# Patient Record
Sex: Female | Born: 1960
Health system: Southern US, Community
[De-identification: ages and names within clinical notes are randomized; demographics above are authoritative.]

## PROBLEM LIST (undated history)

## (undated) DIAGNOSIS — D649 Anemia, unspecified: Secondary | ICD-10-CM

## (undated) DIAGNOSIS — M84851 Other disorders of continuity of bone, right pelvic region and thigh: Secondary | ICD-10-CM

## (undated) DIAGNOSIS — M25369 Other instability, unspecified knee: Secondary | ICD-10-CM

## (undated) DIAGNOSIS — M797 Fibromyalgia: Secondary | ICD-10-CM

## (undated) DIAGNOSIS — I10 Essential (primary) hypertension: Secondary | ICD-10-CM

## (undated) DIAGNOSIS — I639 Cerebral infarction, unspecified: Secondary | ICD-10-CM

## (undated) DIAGNOSIS — Z8719 Personal history of other diseases of the digestive system: Secondary | ICD-10-CM

## (undated) DIAGNOSIS — M539 Dorsopathy, unspecified: Secondary | ICD-10-CM

## (undated) DIAGNOSIS — L03116 Cellulitis of left lower limb: Secondary | ICD-10-CM

## (undated) DIAGNOSIS — T8859XA Other complications of anesthesia, initial encounter: Secondary | ICD-10-CM

## (undated) DIAGNOSIS — D509 Iron deficiency anemia, unspecified: Secondary | ICD-10-CM

## (undated) DIAGNOSIS — K219 Gastro-esophageal reflux disease without esophagitis: Secondary | ICD-10-CM

## (undated) DIAGNOSIS — R011 Cardiac murmur, unspecified: Secondary | ICD-10-CM

## (undated) HISTORY — PX: CHOLECYSTECTOMY: SHX55

## (undated) HISTORY — PX: SIGMOID RESECTION / RECTOPEXY: SUR1294

## (undated) HISTORY — PX: OTHER SURGICAL HISTORY: SHX169

## (undated) HISTORY — PX: LEG SURGERY: SHX1003

## (undated) HISTORY — DX: Cellulitis of left lower limb: L03.116

---

## 2013-01-01 ENCOUNTER — Ambulatory Visit (HOSPITAL_BASED_OUTPATIENT_CLINIC_OR_DEPARTMENT_OTHER)
Admit: 2013-01-01 | Discharge: 2013-01-01 | Disposition: A | Discharge status: Home / Self Care | Payer: BC Managed Care – PPO | Attending: Family Medicine | Admitting: Family Medicine

## 2013-01-01 ENCOUNTER — Telehealth: Payer: Self-pay | Admitting: Emergency Medicine

## 2013-01-01 ENCOUNTER — Encounter: Payer: Self-pay | Admitting: Emergency Medicine

## 2013-01-01 ENCOUNTER — Emergency Department
Admission: EM | Admit: 2013-01-01 | Discharge: 2013-01-01 | Disposition: A | Discharge status: Home / Self Care | Payer: BC Managed Care – PPO | Source: Home / Self Care | Attending: Family Medicine | Admitting: Family Medicine

## 2013-01-01 DIAGNOSIS — M79609 Pain in unspecified limb: Secondary | ICD-10-CM | POA: Insufficient documentation

## 2013-01-01 DIAGNOSIS — M7989 Other specified soft tissue disorders: Secondary | ICD-10-CM | POA: Insufficient documentation

## 2013-01-01 DIAGNOSIS — M25579 Pain in unspecified ankle and joints of unspecified foot: Secondary | ICD-10-CM | POA: Insufficient documentation

## 2013-01-01 DIAGNOSIS — L0390 Cellulitis, unspecified: Secondary | ICD-10-CM

## 2013-01-01 DIAGNOSIS — L539 Erythematous condition, unspecified: Secondary | ICD-10-CM | POA: Insufficient documentation

## 2013-01-01 DIAGNOSIS — L03119 Cellulitis of unspecified part of limb: Secondary | ICD-10-CM

## 2013-01-01 DIAGNOSIS — L02419 Cutaneous abscess of limb, unspecified: Secondary | ICD-10-CM

## 2013-01-01 HISTORY — DX: Fibromyalgia: M79.7

## 2013-01-01 HISTORY — DX: Dorsopathy, unspecified: M53.9

## 2013-01-01 HISTORY — DX: Other disorders of continuity of bone, right pelvic region and thigh: M84.851

## 2013-01-01 HISTORY — DX: Cerebral infarction, unspecified: I63.9

## 2013-01-01 LAB — CBC
HCT: 38.2 % (ref 36.0–46.0)
MCV: 93.4 fL (ref 78.0–100.0)
RBC: 4.09 MIL/uL (ref 3.87–5.11)
WBC: 8.8 10*3/uL (ref 4.0–10.5)

## 2013-01-01 MED ORDER — CEFTRIAXONE SODIUM 1 G IJ SOLR: 1.0000 g | Freq: Once | INTRAMUSCULAR | Status: DC

## 2013-01-01 MED ORDER — HYDROCODONE-ACETAMINOPHEN 5-325 MG PO TABS: ORAL_TABLET | ORAL | Status: DC

## 2013-01-01 MED ORDER — SULFAMETHOXAZOLE-TRIMETHOPRIM 800-160 MG PO TABS: 1.0000 | ORAL_TABLET | Freq: Two times a day (BID) | ORAL | Status: DC

## 2013-01-01 MED ORDER — CEPHALEXIN 500 MG PO CAPS: 500.0000 mg | ORAL_CAPSULE | Freq: Four times a day (QID) | ORAL | Status: DC
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
## 2013-01-01 NOTE — Discharge Instructions (Signed)
Elevate leg as much as possible.  Apply heating pad several times daily.   Cellulitis Cellulitis is an infection of the skin and the tissue beneath it. The infected area is usually red and tender. Cellulitis occurs most often in the arms and lower legs.  CAUSES  Cellulitis is caused by bacteria that enter the skin through cracks or cuts in the skin. The most common types of bacteria that cause cellulitis are Staphylococcus and Streptococcus. SYMPTOMS   Redness and warmth.  Swelling.  Tenderness or pain.  Fever. DIAGNOSIS  Your caregiver can usually determine what is wrong based on a physical exam. Blood tests may also be done. TREATMENT  Treatment usually involves taking an antibiotic medicine. HOME CARE INSTRUCTIONS   Take your antibiotics as directed. Finish them even if you start to feel better.  Keep the infected arm or leg elevated to reduce swelling.  Apply a warm cloth to the affected area up to 4 times per day to relieve pain.  Only take over-the-counter or prescription medicines for pain, discomfort, or fever as directed by your caregiver.  Keep all follow-up appointments as directed by your caregiver. SEEK MEDICAL CARE IF:   You notice red streaks coming from the infected area.  Your red area gets larger or turns dark in color.  Your bone or joint underneath the infected area becomes painful after the skin has healed.  Your infection returns in the same area or another area.  You notice a swollen bump in the infected area.  You develop new symptoms. SEEK IMMEDIATE MEDICAL CARE IF:   You have a fever.  You feel very sleepy.  You develop vomiting or diarrhea.  You have a general ill feeling (malaise) with muscle aches and pains. MAKE SURE YOU:   Understand these instructions.  Will watch your condition.  Will get help right away if you are not doing well or get worse. Document Released: 05/01/2005 Document Revised: 01/21/2012 Document Reviewed:  10/07/2011 [facility] Patient Information 2014 [place], [place].

## 2013-01-01 NOTE — ED Notes (Signed)
Patient reports progressive worsening of swelling and redness in left lower leg x 7 days. Has just come from [facility] where she had oral surgery and has RX for Penicillin ordered post-op. Reports did have edema in extemeties on past occasions, but not this severe.

## 2013-01-03 ENCOUNTER — Telehealth: Payer: Self-pay | Admitting: *Deleted

## 2013-01-03 MED ORDER — HYDROCODONE-ACETAMINOPHEN 5-325 MG PO TABS: ORAL_TABLET | ORAL | Status: DC

## 2013-07-23 ENCOUNTER — Emergency Department
Admission: EM | Admit: 2013-07-23 | Discharge: 2013-07-23 | Disposition: A | Discharge status: Home / Self Care | Payer: BC Managed Care – PPO | Source: Home / Self Care | Attending: Emergency Medicine | Admitting: Emergency Medicine

## 2013-07-23 ENCOUNTER — Emergency Department: Payer: BC Managed Care – PPO

## 2013-07-23 ENCOUNTER — Encounter: Payer: Self-pay | Admitting: Emergency Medicine

## 2013-07-23 DIAGNOSIS — L03116 Cellulitis of left lower limb: Secondary | ICD-10-CM

## 2013-07-23 DIAGNOSIS — I890 Lymphedema, not elsewhere classified: Secondary | ICD-10-CM

## 2013-07-23 DIAGNOSIS — L02419 Cutaneous abscess of limb, unspecified: Secondary | ICD-10-CM

## 2013-07-23 MED ORDER — CEPHALEXIN 500 MG PO CAPS: 500.0000 mg | ORAL_CAPSULE | Freq: Four times a day (QID) | ORAL | Status: DC

## 2013-07-23 MED ORDER — HYDROCODONE-ACETAMINOPHEN 5-325 MG PO TABS: 1.0000 | ORAL_TABLET | Freq: Four times a day (QID) | ORAL | Status: DC | PRN

## 2013-07-23 MED ORDER — DOXYCYCLINE HYCLATE 100 MG PO CAPS: 100.0000 mg | ORAL_CAPSULE | Freq: Two times a day (BID) | ORAL | Status: DC

## 2013-07-23 MED ORDER — CEFTRIAXONE SODIUM 1 G IJ SOLR
1.0000 g | INTRAMUSCULAR | Status: AC
  Administered 2013-07-23: 1 g via INTRAMUSCULAR
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
## 2013-07-23 NOTE — ED Notes (Signed)
Pt c/o LLE swelling, redness and pain x 1wk. She reports a hx of lymphedema and cellulitis. Denies fever.

## 2013-09-01 ENCOUNTER — Encounter: Payer: Self-pay | Admitting: Emergency Medicine

## 2013-09-01 ENCOUNTER — Emergency Department (INDEPENDENT_AMBULATORY_CARE_PROVIDER_SITE_OTHER): Payer: BC Managed Care – PPO

## 2013-09-01 ENCOUNTER — Emergency Department
Admission: EM | Admit: 2013-09-01 | Discharge: 2013-09-01 | Disposition: A | Discharge status: Home / Self Care | Payer: BC Managed Care – PPO | Source: Home / Self Care | Attending: Family Medicine | Admitting: Family Medicine

## 2013-09-01 DIAGNOSIS — X58XXXA Exposure to other specified factors, initial encounter: Secondary | ICD-10-CM

## 2013-09-01 DIAGNOSIS — S2239XA Fracture of one rib, unspecified side, initial encounter for closed fracture: Secondary | ICD-10-CM

## 2013-09-01 DIAGNOSIS — J9819 Other pulmonary collapse: Secondary | ICD-10-CM

## 2013-09-01 DIAGNOSIS — J90 Pleural effusion, not elsewhere classified: Secondary | ICD-10-CM

## 2013-09-01 DIAGNOSIS — S2232XA Fracture of one rib, left side, initial encounter for closed fracture: Secondary | ICD-10-CM

## 2013-09-01 MED ORDER — TAPENTADOL HCL 100 MG PO TABS: 1.0000 | ORAL_TABLET | Freq: Four times a day (QID) | ORAL | Status: DC | PRN
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
## 2013-09-01 NOTE — Discharge Instructions (Signed)
Recommend 400 to 600 units of vitamin D, and 1000mg  calcium per day.   Rib Fracture A rib fracture is a break or crack in one of the bones of the ribs. The ribs are a group of long, curved bones that wrap around your chest and attach to your spine. They protect your lungs and other organs in the chest cavity. A broken or cracked rib is often painful, but most do not cause other problems. Most rib fractures heal on their own over time. However, rib fractures can be more serious if multiple ribs are broken or if broken ribs move out of place and push against other structures. CAUSES   A direct blow to the chest. For example, this could happen during contact sports, a car accident, or a fall against a hard object.  Repetitive movements with high force, such as pitching a baseball or having severe coughing spells. SYMPTOMS   Pain when you breathe in or cough.  Pain when someone presses on the injured area. DIAGNOSIS  Your caregiver will perform a physical exam. Various imaging tests may be ordered to confirm the diagnosis and to look for related injuries. These tests may include a chest X-ray, computed tomography (CT), magnetic resonance imaging (MRI), or a bone scan. TREATMENT  Rib fractures usually heal on their own in 1 3 months. The longer healing period is often associated with a continued cough or other aggravating activities. During the healing period, pain control is very important. Medication is usually given to control pain. Hospitalization or surgery may be needed for more severe injuries, such as those in which multiple ribs are broken or the ribs have moved out of place.  HOME CARE INSTRUCTIONS   Avoid strenuous activity and any activities or movements that cause pain. Be careful during activities and avoid bumping the injured rib.  Gradually increase activity as directed by your caregiver.  Only take over-the-counter or prescription medications as directed by your caregiver. Do not  take other medications without asking your caregiver first.  Apply ice to the injured area for the first 1 2 days after you have been treated or as directed by your caregiver. Applying ice helps to reduce inflammation and pain.  Put ice in a plastic bag.  Place a towel between your skin and the bag.   Leave the ice on for 15 20 minutes at a time, every 2 hours while you are awake.  Perform deep breathing as directed by your caregiver. This will help prevent pneumonia, which is a common complication of a broken rib. Your caregiver may instruct you to:  Take deep breaths several times a day.  Try to cough several times a day, holding a pillow against the injured area.  Use a device called an incentive spirometer to practice deep breathing several times a day.  Drink enough fluids to keep your urine clear or pale yellow. This will help you avoid constipation.   Do not wear a rib belt or binder. These restrict breathing, which can lead to pneumonia.  SEEK IMMEDIATE MEDICAL CARE IF:   You have a fever.   You have difficulty breathing or shortness of breath.   You develop a continual cough, or you cough up thick or bloody sputum.  You feel sick to your stomach (nausea), throw up (vomit), or have abdominal pain.   You have worsening pain not controlled with medications.  MAKE SURE YOU:  Understand these instructions.  Will watch your condition.  Will get help right  away if you are not doing well or get worse. Document Released: 07/22/2005 Document Revised: 03/24/2013 Document Reviewed: 09/23/2012 [facility] Patient Information 2014 [place], [place].

## 2013-09-01 NOTE — ED Notes (Signed)
Sophia Smith fell 9 days ago, went to [facility] in [place], Dx with left rib fx and puncture lung. She was discharged 2 days later and told to f/u with MD 1 week later for f/u x-ray. Pain with deep breathing. Reports breathing and pain are improving. Mild bruising still present on left side of back. She was a smoker, no smoking since her fall 9 days ago.

## 2013-09-04 ENCOUNTER — Telehealth: Payer: Self-pay

## 2013-09-04 NOTE — ED Notes (Signed)
I called and spoke with patient and she is doing better. I advised to call back if anything changes or if she has questions or concerns.  

## 2013-09-13 ENCOUNTER — Ambulatory Visit (INDEPENDENT_AMBULATORY_CARE_PROVIDER_SITE_OTHER): Payer: BC Managed Care – PPO

## 2013-09-13 ENCOUNTER — Ambulatory Visit (INDEPENDENT_AMBULATORY_CARE_PROVIDER_SITE_OTHER): Payer: BC Managed Care – PPO | Admitting: Physician Assistant

## 2013-09-13 ENCOUNTER — Encounter: Payer: Self-pay | Admitting: Physician Assistant

## 2013-09-13 VITALS — BP 110/73 | HR 111 | Ht 69.0 in | Wt 210.0 lb

## 2013-09-13 DIAGNOSIS — Z13220 Encounter for screening for lipoid disorders: Secondary | ICD-10-CM

## 2013-09-13 DIAGNOSIS — M519 Unspecified thoracic, thoracolumbar and lumbosacral intervertebral disc disorder: Secondary | ICD-10-CM | POA: Insufficient documentation

## 2013-09-13 DIAGNOSIS — M797 Fibromyalgia: Secondary | ICD-10-CM

## 2013-09-13 DIAGNOSIS — X58XXXA Exposure to other specified factors, initial encounter: Secondary | ICD-10-CM

## 2013-09-13 DIAGNOSIS — S2232XA Fracture of one rib, left side, initial encounter for closed fracture: Secondary | ICD-10-CM

## 2013-09-13 DIAGNOSIS — J9383 Other pneumothorax: Secondary | ICD-10-CM

## 2013-09-13 DIAGNOSIS — S2239XA Fracture of one rib, unspecified side, initial encounter for closed fracture: Secondary | ICD-10-CM

## 2013-09-13 DIAGNOSIS — I720 Aneurysm of carotid artery: Secondary | ICD-10-CM

## 2013-09-13 DIAGNOSIS — F32A Depression, unspecified: Secondary | ICD-10-CM

## 2013-09-13 DIAGNOSIS — Z131 Encounter for screening for diabetes mellitus: Secondary | ICD-10-CM

## 2013-09-13 DIAGNOSIS — J939 Pneumothorax, unspecified: Secondary | ICD-10-CM

## 2013-09-13 DIAGNOSIS — F329 Major depressive disorder, single episode, unspecified: Secondary | ICD-10-CM

## 2013-09-13 DIAGNOSIS — I890 Lymphedema, not elsewhere classified: Secondary | ICD-10-CM

## 2013-09-13 DIAGNOSIS — S2249XA Multiple fractures of ribs, unspecified side, initial encounter for closed fracture: Secondary | ICD-10-CM

## 2013-09-13 DIAGNOSIS — S329XXA Fracture of unspecified parts of lumbosacral spine and pelvis, initial encounter for closed fracture: Secondary | ICD-10-CM
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
## 2013-09-15 DIAGNOSIS — I720 Aneurysm of carotid artery: Secondary | ICD-10-CM | POA: Insufficient documentation

## 2013-09-15 DIAGNOSIS — F329 Major depressive disorder, single episode, unspecified: Secondary | ICD-10-CM | POA: Insufficient documentation

## 2013-09-15 DIAGNOSIS — F32A Depression, unspecified: Secondary | ICD-10-CM | POA: Insufficient documentation

## 2013-09-15 DIAGNOSIS — S329XXA Fracture of unspecified parts of lumbosacral spine and pelvis, initial encounter for closed fracture: Secondary | ICD-10-CM | POA: Insufficient documentation

## 2013-10-15 ENCOUNTER — Encounter: Payer: Self-pay | Admitting: Physician Assistant

## 2013-10-15 DIAGNOSIS — R748 Abnormal levels of other serum enzymes: Secondary | ICD-10-CM | POA: Insufficient documentation

## 2013-10-15 LAB — COMPLETE METABOLIC PANEL WITH GFR
ALBUMIN: 3.5 g/dL (ref 3.5–5.2)
ALT: 8 U/L (ref 0–35)
AST: 12 U/L (ref 0–37)
Alkaline Phosphatase: 119 U/L — ABNORMAL HIGH (ref 39–117)
BUN: 16 mg/dL (ref 6–23)
CALCIUM: 8.7 mg/dL (ref 8.4–10.5)
CHLORIDE: 110 meq/L (ref 96–112)
CO2: 27 meq/L (ref 19–32)
CREATININE: 0.56 mg/dL (ref 0.50–1.10)
GLUCOSE: 81 mg/dL (ref 70–99)
POTASSIUM: 4.1 meq/L (ref 3.5–5.3)
Sodium: 143 mEq/L (ref 135–145)
Total Bilirubin: 0.3 mg/dL (ref 0.2–1.2)
Total Protein: 6.1 g/dL (ref 6.0–8.3)

## 2013-10-15 LAB — LIPID PANEL
CHOLESTEROL: 114 mg/dL (ref 0–200)
HDL: 36 mg/dL — ABNORMAL LOW (ref >39)
LDL Cholesterol: 68 mg/dL (ref 0–99)
TRIGLYCERIDES: 52 mg/dL (ref <150)
Total CHOL/HDL Ratio: 3.2 Ratio
VLDL: 10 mg/dL (ref 0–40)

## 2013-10-15 LAB — VITAMIN D 25 HYDROXY (VIT D DEFICIENCY, FRACTURES): VIT D 25 HYDROXY: 33 ng/mL (ref 30–89)

## 2013-10-25 ENCOUNTER — Encounter: Payer: Self-pay | Admitting: Physician Assistant

## 2013-10-25 ENCOUNTER — Ambulatory Visit (INDEPENDENT_AMBULATORY_CARE_PROVIDER_SITE_OTHER): Payer: BC Managed Care – PPO | Admitting: Physician Assistant

## 2013-10-25 VITALS — BP 143/95 | HR 104 | Wt 224.0 lb

## 2013-10-25 DIAGNOSIS — M25562 Pain in left knee: Secondary | ICD-10-CM

## 2013-10-25 DIAGNOSIS — R748 Abnormal levels of other serum enzymes: Secondary | ICD-10-CM

## 2013-10-25 DIAGNOSIS — R609 Edema, unspecified: Secondary | ICD-10-CM

## 2013-10-25 DIAGNOSIS — M25569 Pain in unspecified knee: Secondary | ICD-10-CM

## 2013-10-25 DIAGNOSIS — R600 Localized edema: Secondary | ICD-10-CM

## 2013-10-25 MED ORDER — FUROSEMIDE 20 MG PO TABS: 20.0000 mg | ORAL_TABLET | Freq: Every day | ORAL | Status: DC

## 2013-10-25 MED ORDER — CELECOXIB 200 MG PO CAPS: 200.0000 mg | ORAL_CAPSULE | Freq: Two times a day (BID) | ORAL | Status: DC

## 2013-10-25 NOTE — Patient Instructions (Addendum)
Meniscus Tear with Phase I Rehab The meniscus is a C-shaped cartilage structure, located in the knee joint between the thigh bone (femur) and the shinbone (tibia). Two menisci are located in each knee joint: the inner and outer meniscus. The meniscus acts as an adapter between the thigh bone and shinbone, allowing them to fit properly together. It also functions as a shock absorber, to reduce the stress placed on the knee joint and to help supply nutrients to the knee joint cartilage. As people age, the meniscus begins to harden and become more vulnerable to injury. Meniscus tears are a common injury, especially in older athletes. Inner meniscus tears are more common than outer meniscus tears.  SYMPTOMS   Pain in the knee, especially with standing or squatting with the affected leg.  Tenderness along the joint line.  Swelling in the knee joint (effusion), usually starting 1 to 2 days after injury.  Locking or catching of the knee joint, causing inability to straighten the knee completely.  Giving way or buckling of the knee. CAUSES  A meniscus tear occurs when a force is placed on the meniscus that is greater than it can handle. Common causes of injury include:  Direct hit (trauma) to the knee.  Twisting, pivoting, or cutting (rapidly changing direction while running), kneeling or squatting.  Without injury, due to aging. RISK INCREASES WITH:  Contact sports (football, rugby).  Sports in which cleats are used with pivoting (soccer, lacrosse) or sports in which good shoe grip and sudden change in direction are required (racquetball, basketball, squash).  Previous knee injury.  Associated knee injury, particularly ligament injuries.  Poor strength and flexibility. PREVENTION  Warm up and stretch properly before activity.  Maintain physical fitness:  Strength, flexibility, and endurance.  Cardiovascular fitness.  Protect the knee with a brace or elastic bandage.  Wear  properly fitted protective equipment (proper cleats for the surface). PROGNOSIS  Sometimes, meniscus tears heal on their own. However, definitive treatment requires surgery, followed by at least 6 weeks of recovery.  RELATED COMPLICATIONS   Recurring symptoms that result in a chronic problem.  Repeated knee injury, especially if sports are resumed too soon after injury or surgery.  Progression of the tear (the tear gets larger), if untreated.  Arthritis of the knee in later years (with or without surgery).  Complications of surgery, including infection, bleeding, injury to nerves (numbness, weakness, paralysis) continued pain, giving way, locking, nonhealing of meniscus (if repaired), need for further surgery, and knee stiffness (loss of motion). TREATMENT  Treatment first involves the use of ice and medicine, to reduce pain and inflammation. You may find using crutches to walk more comfortable. However, it is okay to bear weight on the injured knee, if the pain will allow it. Surgery is often advised as a definitive treatment. Surgery is performed through an incision near the joint (arthroscopically). The torn piece of the meniscus is removed, and if possible the joint cartilage is repaired. After surgery, the joint must be restrained. After restraint, it is important to perform strengthening and stretching exercises to help regain strength and a full range of motion. These exercises may be completed at home or with a therapist.  MEDICATION  If pain medicine is needed, nonsteroidal anti-inflammatory medicines (aspirin and ibuprofen), or other minor pain relievers (acetaminophen), are often advised.  Do not take pain medicine for 7 days before surgery.  Prescription pain relievers may be given, if your caregiver thinks they are needed. Use only as directed and   only as much as you need. HEAT AND COLD  Cold treatment (icing) should be applied for 10 to 15 minutes every 2 to 3 hours for  inflammation and pain, and immediately after activity that aggravates your symptoms. Use ice packs or an ice massage.  Heat treatment may be used before performing stretching and strengthening activities prescribed by your caregiver, physical therapist, or athletic trainer. Use a heat pack or a warm water soak. SEEK MEDICAL CARE IF:   Symptoms get worse or do not improve in 2 weeks, despite treatment.  New, unexplained symptoms develop. (Drugs used in treatment may produce side effects.) EXERCISES RANGE OF MOTION (ROM) AND STRETCHING EXERCISES - Meniscus Tear, Non-operative, Phase I These are some of the initial exercises with which you may start your rehabilitation program, until you see your caregiver again or until your symptoms are resolved. Remember:   These initial exercises are intended to be gentle. They will help you restore motion without increasing any swelling.  Completing these exercises allows less painful movement and prepares you for the more aggressive strengthening exercises in Phase II.  An effective stretch should be held for at least 30 seconds.  A stretch should never be painful. You should only feel a gentle lengthening or release in the stretched tissue. RANGE OF MOTION - Knee Flexion, Active  Lie on your back with both knees straight. (If this causes back discomfort, bend your healthy knee, placing your foot flat on the floor.)  Slowly slide your heel back toward your buttocks until you feel a gentle stretch in the front of your knee or thigh.  Hold for __________ seconds. Slowly slide your heel back to the starting position. Repeat __________ times. Complete this exercise __________ times per day.  RANGE OF MOTION - Knee Flexion and Extension, Active-Assisted  Sit on the edge of a table or chair with your thighs firmly supported. It may be helpful to place a folded towel under the end of your right / left thigh.  Flexion (bending): Place the ankle of your  healthy leg on top of the other ankle. Use your healthy leg to gently bend your right / left knee until you feel a mild tension across the top of your knee.  Hold for __________ seconds.  Extension (straightening): Switch your ankles so your right / left leg is on top. Use your healthy leg to straighten your right / left knee until you feel a mild tension on the backside of your knee.  Hold for __________ seconds. Repeat __________ times. Complete __________ times per day. STRETCH - Knee Flexion, Supine  Lie on the floor with your right / left heel and foot lightly touching the wall. (Place both feet on the wall if you do not use a door frame.)  Without using any effort, allow gravity to slide your foot down the wall slowly until you feel a gentle stretch in the front of your right / left knee.  Hold this stretch for __________ seconds. Then return the leg to the starting position, using your healthy leg for help, if needed. Repeat __________ times. Complete this stretch __________ times per day.  STRETCH - Knee Extension Sitting  Sit with your right / left leg/heel propped on another chair, coffee table, or foot stool.  Allow your leg muscles to relax, letting gravity straighten out your knee.*  You should feel a stretch behind your right / left knee. Hold this position for __________ seconds. Repeat __________ times. Complete this stretch __________   times per day.  *Your physician, physical therapist or athletic trainer may instruct you place a __________ weight on your thigh, just above your kneecap, to deepen the stretch.  STRENGTHENING EXERCISES - Meniscus Tear, Non-operative, Phase I These exercises may help you when beginning to rehabilitate your injury. They may resolve your symptoms with or without further involvement from your physician, physical therapist or athletic trainer. While completing these exercises, remember:   Muscles can gain both the endurance and the strength  needed for everyday activities through controlled exercises.  Complete these exercises as instructed by your physician, physical therapist or athletic trainer. Progress the resistance and repetitions only as guided. STRENGTH - Quadriceps, Isometrics  Lie on your back with your right / left leg extended and your opposite knee bent.  Gradually tense the muscles in the front of your right / left thigh. You should see either your knee cap slide up toward your hip or increased dimpling just above the knee. This motion will push the back of the knee down toward the floor, mat, or bed on which you are lying.  Hold the muscle as tight as you can, without increasing your pain, for __________ seconds.  Relax the muscles slowly and completely between each repetition. Repeat __________ times. Complete this exercise __________ times per day.  STRENGTH - Quadriceps, Short Arcs   Lie on your back. Place a __________ inch towel roll under your right / left knee, so that the knee bends slightly.  Raise only your lower leg by tightening the muscles in the front of your thigh. Do not allow your thigh to rise.  Hold this position for __________ seconds. Repeat __________ times. Complete this exercise __________ times per day.  OPTIONAL ANKLE WEIGHTS: Begin with ____________________, but DO NOT exceed ____________________. Increase in 1 pound/0.5 kilogram increments. STRENGTH - Quadriceps, Straight Leg Raises  Quality counts! Watch for signs that the quadriceps muscle is working, to be sure you are strengthening the correct muscles and not "cheating" by substituting with healthier muscles.  Lay on your back with your right / left leg extended and your opposite knee bent.  Tense the muscles in the front of your right / left thigh. You should see either your knee cap slide up or increased dimpling just above the knee. Your thigh may even shake a bit.  Tighten these muscles even more and raise your leg 4 to 6  inches off the floor. Hold for __________ seconds.  Keeping these muscles tense, lower your leg.  Relax the muscles slowly and completely in between each repetition. Repeat __________ times. Complete this exercise __________ times per day.  STRENGTH - Hamstring, Curls   Lay on your stomach with your legs extended. (If you lay on a bed, your feet may hang over the edge.)  Tighten the muscles in the back of your thigh to bend your right / left knee up to 90 degrees. Keep your hips flat on the bed.  Hold this position for __________ seconds.  Slowly lower your leg back to the starting position. Repeat __________ times. Complete this exercise __________ times per day.  STRENGTH  Quadriceps, Squats  Stand in a door frame so that your feet and knees are in line with the frame.  Use your hands for balance, not support, on the frame.  Slowly lower your weight, bending at the hips and knees. Keep your lower legs upright so that they are parallel with the door frame. Squat only within the range that does   not increase your knee pain. Never let your hips drop below your knees.  Slowly return upright, pushing with your legs, not pulling with your hands. Repeat __________ times. Complete this exercise __________ times per day.  STRENGTH - Quad/VMO, Isometric   Sit in a chair with your right / left knee slightly bent. With your fingertips, feel the VMO muscle just above the inside of your knee. The VMO is important in controlling the position of your kneecap.  Keeping your fingertips on this muscle. Without actually moving your leg, attempt to drive your knee down as if straightening your leg. You should feel your VMO tense. If you have a difficult time, you may wish to try the same exercise on your healthy knee first.  Tense this muscle as hard as you can without increasing any knee pain.  Hold for __________ seconds. Relax the muscles slowly and completely in between each repetition. Repeat  __________ times. Complete exercise __________ times per day.  Document Released: 08/05/1998 Document Revised: 10/14/2011 Document Reviewed: 11/03/2008 [employer] Patient Information 2014 [employer].    

## 2013-10-25 NOTE — Progress Notes (Signed)
   Subjective:    Patient ID: Sophia Smith, female    DOB: [birth year], 53 y.o.   MRN: [id number]  HPI Pt is a 52 yo female who presents to the clinic with left knee pain and to followup on elevated alkaline phosphatase levels..  Left knee pain started about one month ago after a fall in the car port. She she fell backwards and landed on her back however she feels that she twisted her left knee. Pain is located on the lateral side of left knee. Hurts worse with walking and bearing weight as well as changing positions. She walks with a cane. She has not tried anything to make better. She does feel like her legs are swelling more and that seems to be making worse.   Elevated alkaline phosphatase-we'll recheck today.    Review of Systems     Objective:   Physical Exam  Constitutional: She appears well-developed and well-nourished.  Obesity.   HENT:  Head: Normocephalic and atraumatic.  Cardiovascular: Regular rhythm and normal heart sounds.   Tachycardia at 104.   Pulmonary/Chest: Effort normal and breath sounds normal.  Musculoskeletal:  Extension and flexion are full with some discomfort. Flexion to about 110 degrees(limited by swelling of leg and pain). Pain in the lateral joint space. Negative McMurrays. Strength 5/5 bilateral legs. I don't see in significant effusion of knee but subcutaneous fat surrounds.   1+pitting edema in bilateral lower extremities.   Skin: Skin is dry.  Psychiatric: She has a normal mood and affect. Her behavior is normal.          Assessment & Plan:  Left knee pain- would like to get bilateral knee films to compare for osteoarthritis. I do suspect that there is a partial tear of the meniscus. I offered patient injection in the knee today. She declined. Patient is unable to tolerate NSAIDs do to abdominal pain/GI intolerance. Up with to try Celebrex to see if it would be easier on her stomach. I encouraged patient to elevate and ice the knee as much as  possible in the next couple days as well as working on range of motion with exercises given. Followup if pain not improving.  Bilateral lower extremity edema- patient is present with weight gain and bilateral edema today. We'll start Lasix 20 mg daily and followup in one month. Pt has compression stocking and encouraged to wear.   Elevated alkaline phosphatase-this could be do to pain medication that she was recently on for pneumothorax. We'll recheck today.

## 2013-10-26 ENCOUNTER — Ambulatory Visit (INDEPENDENT_AMBULATORY_CARE_PROVIDER_SITE_OTHER): Payer: BC Managed Care – PPO

## 2013-10-26 ENCOUNTER — Encounter: Payer: Self-pay | Admitting: Physician Assistant

## 2013-10-26 DIAGNOSIS — M25562 Pain in left knee: Secondary | ICD-10-CM | POA: Insufficient documentation

## 2013-10-26 DIAGNOSIS — R600 Localized edema: Secondary | ICD-10-CM | POA: Insufficient documentation

## 2013-10-26 DIAGNOSIS — M1710 Unilateral primary osteoarthritis, unspecified knee: Secondary | ICD-10-CM | POA: Insufficient documentation

## 2013-10-26 DIAGNOSIS — IMO0002 Reserved for concepts with insufficient information to code with codable children: Secondary | ICD-10-CM

## 2013-10-27 ENCOUNTER — Telehealth: Payer: Self-pay | Admitting: *Deleted

## 2013-10-27 LAB — COMPLETE METABOLIC PANEL WITH GFR
ALK PHOS: 117 U/L (ref 39–117)
ALT: 8 U/L (ref 0–35)
AST: 12 U/L (ref 0–37)
Albumin: 3.6 g/dL (ref 3.5–5.2)
BILIRUBIN TOTAL: 0.3 mg/dL (ref 0.2–1.2)
BUN: 18 mg/dL (ref 6–23)
CO2: 28 meq/L (ref 19–32)
CREATININE: 0.58 mg/dL (ref 0.50–1.10)
Calcium: 8.7 mg/dL (ref 8.4–10.5)
Chloride: 108 mEq/L (ref 96–112)
GFR, Est Non African American: >89 mL/min
Glucose, Bld: 74 mg/dL (ref 70–99)
Potassium: 4.3 mEq/L (ref 3.5–5.3)
SODIUM: 140 meq/L (ref 135–145)
TOTAL PROTEIN: 6.3 g/dL (ref 6.0–8.3)

## 2013-10-27 NOTE — Telephone Encounter (Signed)
Celebrex approved from 10/27/13 until 08/04/2038. Oscar La, LPN

## 2013-11-15 ENCOUNTER — Ambulatory Visit (INDEPENDENT_AMBULATORY_CARE_PROVIDER_SITE_OTHER): Payer: BC Managed Care – PPO | Admitting: Physician Assistant

## 2013-11-15 ENCOUNTER — Encounter: Payer: Self-pay | Admitting: Physician Assistant

## 2013-11-15 VITALS — BP 155/93 | HR 109 | Wt 221.0 lb

## 2013-11-15 DIAGNOSIS — M25562 Pain in left knee: Secondary | ICD-10-CM

## 2013-11-15 DIAGNOSIS — M25569 Pain in unspecified knee: Secondary | ICD-10-CM

## 2013-11-15 DIAGNOSIS — S29012A Strain of muscle and tendon of back wall of thorax, initial encounter: Secondary | ICD-10-CM

## 2013-11-15 DIAGNOSIS — S239XXA Sprain of unspecified parts of thorax, initial encounter: Secondary | ICD-10-CM

## 2013-11-15 MED ORDER — HYDROCODONE-ACETAMINOPHEN 7.5-325 MG PO TABS: 1.0000 | ORAL_TABLET | Freq: Three times a day (TID) | ORAL | Status: DC | PRN

## 2013-11-15 MED ORDER — DICLOFENAC SODIUM 1 % TD GEL: 4.0000 g | Freq: Four times a day (QID) | TRANSDERMAL | Status: DC

## 2013-11-15 MED ORDER — CYCLOBENZAPRINE HCL 10 MG PO TABS: 10.0000 mg | ORAL_TABLET | Freq: Three times a day (TID) | ORAL | Status: DC | PRN

## 2013-11-15 NOTE — Progress Notes (Signed)
   Subjective:    Patient ID: Sophia Smith, female    DOB: March 18, 1961, 53 y.o.   MRN: [id number]  HPI Pt is a 53 yo female who presents to the clinic with right upper back pain after fall 3 days ago. Her left knee gave out and she fell straight back but more on her right upper back she fell. She has been sore every since. She denies any shoulder pain. It is hard to walk with left knee pain and right back pain because that effects her cane hand. She cannot take NSAIDs. Celebrex was too expensive. She was scheduled for MRI of left knee today but could not make it due to pain.   Review of Systems     Objective:   Physical Exam  Musculoskeletal:       Arms: Right shoulder limited abduction past 130 degress due to pain over rhomboid muscles. No tenderness to palpation over acromion, coracoid, scapula, clavicle. No spine tenderness.  Strength 5/5 of right arm. No pain with external or internal rotation. Empty can negative. Hawkins negative. Hand grip 5/5. neurovasularly intact with sensation of hands/fingers.          Assessment & Plan:  Rhomboid muscle spasms- will give flexeril. Pain is not localized around joint or spine. I do not feel there is a need for imaging. Gave exercises to start. Small quantity of norco was given for acute pain and to use before MRI of knee. Encouraged pt to have MRI done. Encouraged warm compresses over back. Voltaren can be used over area.   Left knee pain- Knee exam was not done today. Has been done before and pt needs imaging. due to instability I feel like there is likely a meniscal tear. MRI has already been order by another provider. I encouraged pt to have completed so that she can gain more stability. Pt can not tolerate NSAIDs. celebrex was too expensive. Will try Voltaren gel as needed QID. Offered pt injection in knee today she declined.

## 2013-11-26 ENCOUNTER — Ambulatory Visit (INDEPENDENT_AMBULATORY_CARE_PROVIDER_SITE_OTHER): Payer: BC Managed Care – PPO | Admitting: Physician Assistant

## 2013-11-26 ENCOUNTER — Ambulatory Visit (INDEPENDENT_AMBULATORY_CARE_PROVIDER_SITE_OTHER): Payer: BC Managed Care – PPO

## 2013-11-26 ENCOUNTER — Encounter: Payer: Self-pay | Admitting: Physician Assistant

## 2013-11-26 VITALS — BP 135/90 | HR 110 | Ht 69.0 in | Wt 220.0 lb

## 2013-11-26 DIAGNOSIS — IMO0001 Reserved for inherently not codable concepts without codable children: Secondary | ICD-10-CM

## 2013-11-26 DIAGNOSIS — M546 Pain in thoracic spine: Secondary | ICD-10-CM

## 2013-11-26 DIAGNOSIS — R109 Unspecified abdominal pain: Secondary | ICD-10-CM

## 2013-11-26 LAB — POCT URINALYSIS DIPSTICK
GLUCOSE UA: NEGATIVE
NITRITE UA: NEGATIVE
Protein, UA: 30
RBC UA: NEGATIVE
SPEC GRAV UA: 1.02
UROBILINOGEN UA: 1
pH, UA: 7

## 2013-11-26 MED ORDER — HYDROCODONE-ACETAMINOPHEN 7.5-325 MG PO TABS: 1.0000 | ORAL_TABLET | Freq: Four times a day (QID) | ORAL | Status: DC | PRN

## 2013-11-29 NOTE — Progress Notes (Signed)
   Subjective:    Patient ID: Sophia Smith, female    DOB: 1961-06-06, 53 y.o.   MRN: [id number]  HPI Pt presents to the clinic with follow up after fall and right sided flank/thoracic pain. Pain still continues and worse with movement and deep breathing. Per pt norco helps signifcantly but has ran out. Flexeril helps and she does use as needed. Continues on neurontin daily. Cannot tolerate NSAIDs. Denies any urinary symptoms. No fever or chills. No new falls.    Review of Systems     Objective:   Physical Exam  Constitutional: She is oriented to person, place, and time. She appears well-developed and well-nourished.  HENT:  Head: Normocephalic and atraumatic.  Cardiovascular: Normal rate, regular rhythm and normal heart sounds.   Pulmonary/Chest: Effort normal and breath sounds normal.  No CVA tenderness.   Musculoskeletal:  Tenderness to palpation over thoracic back and under right arm and down right flank. No rash, redness, swelling or bruising. No pain over spine to palpation.   Neurological: She is alert and oriented to person, place, and time.  Skin: Skin is dry.  Psychiatric: She has a normal mood and affect. Her behavior is normal.          Assessment & Plan:  Right sided thoraic and flank pain- no urinary symptoms but UA done. .. Results for orders placed in visit on 11/26/13  POCT URINALYSIS DIPSTICK      Result Value Ref Range   Color, UA dark yellow     Clarity, UA clear     Glucose, UA neg     Bilirubin, UA small     Ketones, UA trace     Spec Grav, UA 1.020     Blood, UA neg     pH, UA 7.0     Protein, UA 30     Urobilinogen, UA 1.0     Nitrite, UA neg     Leukocytes, UA Trace     Will culture. Will also make sure ribs are healing and no new trauma with CXR of chest and ribs. Discussed with pt that there is no cause of pain at this point. Discussed possibility of shingles but pt has no rash. Pt is very tender to touch. Seems musculoskelatal or  costrochondritis. Continue flexeril, voltaren gel, neurontin. I did give Norco for breakthrough pain. May need a couple of weeks to fully resolve.

## 2013-12-01 LAB — URINE CULTURE
COLONY COUNT: NO GROWTH
ORGANISM ID, BACTERIA: NO GROWTH

## 2013-12-11 ENCOUNTER — Encounter: Payer: Self-pay | Admitting: Emergency Medicine

## 2013-12-11 ENCOUNTER — Emergency Department (INDEPENDENT_AMBULATORY_CARE_PROVIDER_SITE_OTHER): Payer: BC Managed Care – PPO

## 2013-12-11 ENCOUNTER — Emergency Department (INDEPENDENT_AMBULATORY_CARE_PROVIDER_SITE_OTHER)
Admission: EM | Admit: 2013-12-11 | Discharge: 2013-12-11 | Disposition: A | Discharge status: Home / Self Care | Payer: Medicare PPO | Source: Home / Self Care | Attending: Family Medicine | Admitting: Family Medicine

## 2013-12-11 DIAGNOSIS — S63501A Unspecified sprain of right wrist, initial encounter: Secondary | ICD-10-CM

## 2013-12-11 DIAGNOSIS — M25539 Pain in unspecified wrist: Secondary | ICD-10-CM

## 2013-12-11 DIAGNOSIS — M79609 Pain in unspecified limb: Secondary | ICD-10-CM

## 2013-12-11 DIAGNOSIS — M7989 Other specified soft tissue disorders: Secondary | ICD-10-CM

## 2013-12-11 DIAGNOSIS — S63509A Unspecified sprain of unspecified wrist, initial encounter: Secondary | ICD-10-CM

## 2013-12-11 HISTORY — DX: Other instability, unspecified knee: M25.369

## 2013-12-11 MED ORDER — HYDROCODONE-ACETAMINOPHEN 10-325 MG PO TABS: ORAL_TABLET | ORAL | Status: DC

## 2013-12-11 NOTE — Discharge Instructions (Signed)
Apply ice pack for 20 minutes 3 to 4 times daily today and tomorrow.  Elevate.  Wear brace for about 2 to 3 weeks.  Begin range of motion and stretching exercises in about 5 days as per instruction sheet.  Take Tylenol daytime for pain.

## 2013-12-11 NOTE — ED Notes (Signed)
Reports falling while walking inside home last night. Now right hand and wrist hurting with some edema and bruising.

## 2013-12-11 NOTE — ED Provider Notes (Signed)
CSN: [id number]     Arrival date & time 12/11/13  1443 History   First MD Initiated Contact with Patient 12/11/13 1459     Chief Complaint  Patient presents with  . Wrist Pain  . Hand Pain      HPI Comments: Yesterday while arising from a couch, patient lost her balance and fell backwards, bracing with her right hand.  She developed sudden pain in her right hand/wrist that has persisted.  Patient is a 53 y.o. female presenting with hand injury. The history is provided by the patient.  Hand Injury Location:  Wrist and hand Time since incident:  1 day Wrist location:  R wrist Hand location:  R hand Pain details:    Quality:  Aching   Radiates to:  Does not radiate   Severity:  Moderate   Onset quality:  Sudden   Duration:  1 day   Timing:  Constant   Progression:  Unchanged Chronicity:  New Dislocation: no   Prior injury to area:  No Relieved by:  Nothing Worsened by:  Movement Ineffective treatments:  None tried Associated symptoms: decreased range of motion, stiffness and swelling   Associated symptoms: no back pain, no muscle weakness, no numbness and no tingling     Past Medical History  Diagnosis Date  . Fibromyalgia   . Back disorder   . Other disorders of continuity of bone, right pelvic region and thigh   . Stroke   . Knee gives out    Past Surgical History  Procedure Laterality Date  . Cholecystectomy    . Leg surgery Bilateral     numerous leg surgeries post accident age 56  . Sigmoid resection / rectopexy     Family History  Problem Relation Age of Onset  . Heart attack Father   . Cancer Father    History  Substance Use Topics  . Smoking status: Former Smoker -- 0.50 packs/day    Types: Cigarettes    Quit date: 08/23/2013  . Smokeless tobacco: Never Used  . Alcohol Use: No   OB History   Grav Para Term Preterm Abortions TAB SAB Ect Mult Living                 Review of Systems  Musculoskeletal: Positive for stiffness. Negative for back pain.   All other systems reviewed and are negative.   Allergies  Nsaids  Home Medications   Prior to Admission medications   Medication Sig Start Date End Date Taking? Authorizing Provider  citalopram (CELEXA) 40 MG tablet Take 40 mg by mouth daily.    Historical Provider, MD  cyclobenzaprine (FLEXERIL) 10 MG tablet Take 1 tablet (10 mg total) by mouth 3 (three) times daily as needed for muscle spasms. 11/15/13   Jade L Breeback, PA-C  diclofenac sodium (VOLTAREN) 1 % GEL Apply 4 g topically 4 (four) times daily. 11/15/13   Jade L Breeback, PA-C  furosemide (LASIX) 20 MG tablet Take 1 tablet (20 mg total) by mouth daily. 10/25/13   Jade L Breeback, PA-C  gabapentin (NEURONTIN) 800 MG tablet Take 800 mg by mouth 3 (three) times daily.    Historical Provider, MD  HYDROcodone-acetaminophen (NORCO) 7.5-325 MG per tablet Take 1 tablet by mouth every 6 (six) hours as needed for moderate pain. 11/26/13   Jade L Breeback, PA-C  Tapentadol HCl 100 MG TABS Take 1 tablet by mouth 4 (four) times daily. Take 6 tabs a day. 09/01/13   Kandra Nicolas, MD  traZODone (DESYREL) 100 MG tablet Take 100 mg by mouth at bedtime.    Historical Provider, MD   BP 151/92  Pulse 100  Temp(Src) 98.8 F (37.1 C) (Oral)  Resp 18  Ht 5\' 9"  (1.753 m)  Wt 200 lb (90.719 kg)  BMI 29.52 kg/m2  SpO2 100% Physical Exam  Nursing note and vitals reviewed. Constitutional: She is oriented to person, place, and time. She appears well-developed and well-nourished. No distress.  HENT:  Head: Atraumatic.  Eyes: Conjunctivae are normal. Pupils are equal, round, and reactive to light.  Musculoskeletal:       Right wrist: She exhibits decreased range of motion, tenderness, bony tenderness and swelling. She exhibits no crepitus and no deformity.       Right hand: She exhibits decreased range of motion, tenderness and bony tenderness. She exhibits normal two-point discrimination, normal capillary refill, no deformity, no laceration and no  swelling. Normal sensation noted. Normal strength noted.  Right wrist has tenderness dorsally over the distal radius. Right hand has tenderness over the first metacarpal.  Distal neurovascular function is intact.   Neurological: She is alert and oriented to person, place, and time.  Skin: Skin is warm and dry.    ED Course  Procedures  none     Imaging Review Dg Wrist Complete Right  12/11/2013   CLINICAL DATA:  Last night  EXAM: RIGHT WRIST - COMPLETE 3+ VIEW  COMPARISON:  None.  FINDINGS: There is no evidence of fracture or dislocation. There is no evidence of arthropathy or other focal bone abnormality. Soft tissues are unremarkable.  IMPRESSION: Negative.   Electronically Signed   By: Skipper Cliche M.D.   On: 12/11/2013 15:32   Dg Hand Complete Right  12/11/2013   CLINICAL DATA:  Fall.  Dorsal hand pain and swelling.  EXAM: RIGHT HAND - COMPLETE 3+ VIEW  COMPARISON:  None.  FINDINGS: There is no evidence of fracture or dislocation. There is no evidence of arthropathy or other focal bone abnormality. Soft tissues are unremarkable.  IMPRESSION: Negative.   Electronically Signed   By: Earle Gell M.D.   On: 12/11/2013 15:34     MDM   1. Sprain of right wrist    Thumb spica splint applied.  Lortab for pain at bedtime. Apply ice pack for 20 minutes 3 to 4 times daily today and tomorrow.  Elevate.  Wear brace for about 2 to 3 weeks.  Begin range of motion and stretching exercises in about 5 days as per instruction sheet.  Take Tylenol daytime for pain. Followup with Dr. Aundria Mems ([facility]) if not improving about two weeks.     Kandra Nicolas, MD 12/13/13 [phone]

## 2014-01-11 ENCOUNTER — Telehealth: Payer: Self-pay | Admitting: *Deleted

## 2014-01-11 NOTE — Telephone Encounter (Signed)
Voltaren Gel approved from 12/08/13 to 13/31/2039. Case ID [id number].  Oscar La, LPN

## 2014-01-12 ENCOUNTER — Ambulatory Visit (INDEPENDENT_AMBULATORY_CARE_PROVIDER_SITE_OTHER): Payer: Medicare PPO | Admitting: Physician Assistant

## 2014-01-12 ENCOUNTER — Encounter: Payer: Self-pay | Admitting: Physician Assistant

## 2014-01-12 VITALS — BP 147/99 | HR 108 | Wt 193.0 lb

## 2014-01-12 DIAGNOSIS — M1710 Unilateral primary osteoarthritis, unspecified knee: Secondary | ICD-10-CM

## 2014-01-12 DIAGNOSIS — S83209A Unspecified tear of unspecified meniscus, current injury, unspecified knee, initial encounter: Secondary | ICD-10-CM

## 2014-01-12 DIAGNOSIS — M25562 Pain in left knee: Secondary | ICD-10-CM

## 2014-01-12 DIAGNOSIS — M25569 Pain in unspecified knee: Secondary | ICD-10-CM

## 2014-01-12 DIAGNOSIS — IMO0002 Reserved for concepts with insufficient information to code with codable children: Secondary | ICD-10-CM

## 2014-01-12 MED ORDER — HYDROCODONE-ACETAMINOPHEN 10-325 MG PO TABS: ORAL_TABLET | ORAL | Status: DC

## 2014-01-12 NOTE — Progress Notes (Signed)
   Subjective:    Patient ID: Sophia Smith, female    DOB: 01-31-1961, 53 y.o.   MRN: [id number]  HPI Patient is a 53 year old female who presents to the clinic with left knee pain. She was recently seen [facility]. MRI was done and revealed complex flap tear of the lateral meniscus, tricomprartmental osteoarthritis, osteophytes, edema. Her patient Dr. Ennis Forts suggests total knee replacement. She was given a cortisone shot in May. Shot helped for 4 days and then pain was back. She is due to see him in July. Patient cannot take anti-inflammatories orally. She does use voltaren gel twice a day. It helps some. She would like loratab for break through pain today.    Review of Systems  All other systems reviewed and are negative.      Objective:   Physical Exam  Constitutional: She is oriented to person, place, and time. She appears well-developed and well-nourished.  HENT:  Head: Normocephalic and atraumatic.  Eyes: Conjunctivae are normal. Right eye exhibits no discharge. Left eye exhibits no discharge.  Cardiovascular: Regular rhythm and normal heart sounds.   Tachycardia at 108.   Pulmonary/Chest: Effort normal and breath sounds normal.  Neurological: She is alert and oriented to person, place, and time.  Psychiatric: She has a normal mood and affect. Her behavior is normal.          Assessment & Plan:  Left knee pain/lateral mensical tear/osteoarthritis- patient is very hesitant about surgery. Discussed with patient that surgery is really her only option. t. She can not keep going like this at her age. Discussed risks and benefits of surgery encouraged patient to make appointment with Dr.Howe and set up surgery. I did given her loratab for breakthrough pain. She cannot take NSAIDs. Did encourage her to increase voltaren application to 4 times a day since beneficial. She really needs to start getting narcotics from surgeon once she decides to do surgery.   Spent 30 minutes with patient and  greater than 50 percent of visit spent counseling pt regarding left knee pain and replacement.

## 2014-01-20 ENCOUNTER — Encounter: Payer: Self-pay | Admitting: Physician Assistant

## 2014-01-21 ENCOUNTER — Ambulatory Visit (INDEPENDENT_AMBULATORY_CARE_PROVIDER_SITE_OTHER): Payer: Medicare PPO

## 2014-01-21 ENCOUNTER — Telehealth: Payer: Self-pay | Admitting: *Deleted

## 2014-01-21 DIAGNOSIS — K449 Diaphragmatic hernia without obstruction or gangrene: Secondary | ICD-10-CM

## 2014-01-21 DIAGNOSIS — R0781 Pleurodynia: Secondary | ICD-10-CM

## 2014-01-21 NOTE — Telephone Encounter (Signed)
Pt left vm stating that she is having severe pain under her left arm/ rib cage area.  She stated that it feels like it did when she had the broken rib.  She is wondering if you could just order a CXR.  Please advise.

## 2014-01-21 NOTE — Telephone Encounter (Signed)
X ray ordered & pt notified.

## 2014-01-21 NOTE — Telephone Encounter (Signed)
Ok to order CXR with left ribs.

## 2014-02-07 ENCOUNTER — Telehealth: Payer: Self-pay | Admitting: *Deleted

## 2014-02-07 NOTE — Telephone Encounter (Signed)
Pt states that her appt with ortho has been moved to the 23rd and she will have to postpone surgery because her husband was just dx with cancer & is having surgery the 29th.  She wants to know if you will fill her lortab in the meantime.  Please advise.

## 2014-02-07 NOTE — Telephone Encounter (Signed)
Will orthopedic not give any pain rx? I would prefer ortho to manage.

## 2014-02-08 NOTE — Telephone Encounter (Signed)
Spoke with pt & she will contact ortho to see if they will manage her pain med since they're the one that moved her appt.

## 2014-02-21 ENCOUNTER — Other Ambulatory Visit: Payer: Self-pay | Admitting: *Deleted

## 2014-02-21 ENCOUNTER — Encounter: Payer: Self-pay | Admitting: Family Medicine

## 2014-02-21 ENCOUNTER — Ambulatory Visit (INDEPENDENT_AMBULATORY_CARE_PROVIDER_SITE_OTHER): Payer: Medicare PPO | Admitting: Family Medicine

## 2014-02-21 ENCOUNTER — Telehealth: Payer: Self-pay | Admitting: *Deleted

## 2014-02-21 VITALS — BP 122/72 | HR 99 | Temp 98.5°F | Ht 69.0 in | Wt 217.0 lb

## 2014-02-21 DIAGNOSIS — Z1231 Encounter for screening mammogram for malignant neoplasm of breast: Secondary | ICD-10-CM

## 2014-02-21 DIAGNOSIS — Z23 Encounter for immunization: Secondary | ICD-10-CM

## 2014-02-21 DIAGNOSIS — R600 Localized edema: Secondary | ICD-10-CM

## 2014-02-21 DIAGNOSIS — L03116 Cellulitis of left lower limb: Secondary | ICD-10-CM

## 2014-02-21 DIAGNOSIS — L02419 Cutaneous abscess of limb, unspecified: Secondary | ICD-10-CM

## 2014-02-21 DIAGNOSIS — R609 Edema, unspecified: Secondary | ICD-10-CM

## 2014-02-21 DIAGNOSIS — L03119 Cellulitis of unspecified part of limb: Secondary | ICD-10-CM

## 2014-02-21 MED ORDER — TRAMADOL HCL 50 MG PO TABS: 50.0000 mg | ORAL_TABLET | Freq: Three times a day (TID) | ORAL | Status: DC | PRN

## 2014-02-21 MED ORDER — FUROSEMIDE 10 MG/ML IJ SOLN
40.0000 mg | Freq: Once | INTRAMUSCULAR | Status: AC
  Administered 2014-02-21: 40 mg via INTRAMUSCULAR

## 2014-02-21 MED ORDER — CEPHALEXIN 500 MG PO CAPS: 1000.0000 mg | ORAL_CAPSULE | Freq: Three times a day (TID) | ORAL | Status: DC

## 2014-02-21 MED ORDER — PENICILLIN G BENZATHINE 1200000 UNIT/2ML IM SUSP
1.2000 10*6.[IU] | Freq: Once | INTRAMUSCULAR | Status: AC
  Administered 2014-02-21: 1.2 10*6.[IU] via INTRAMUSCULAR

## 2014-02-21 MED ORDER — FUROSEMIDE 40 MG PO TABS: 40.0000 mg | ORAL_TABLET | Freq: Two times a day (BID) | ORAL | Status: DC

## 2014-02-21 MED ORDER — DICLOXACILLIN SODIUM 500 MG PO CAPS: 500.0000 mg | ORAL_CAPSULE | Freq: Four times a day (QID) | ORAL | Status: DC

## 2014-02-21 MED ORDER — HYDROCODONE-ACETAMINOPHEN 10-325 MG PO TABS: ORAL_TABLET | ORAL | Status: DC

## 2014-02-21 NOTE — Telephone Encounter (Signed)
Pt walked in & stated that the pharmacy doesn't have the abx you sent over for her & that it would take a couple of days to get in.  Is there another alternative that we can send? Please advise.

## 2014-02-21 NOTE — Telephone Encounter (Signed)
Ok I will send over a new antibiotic.

## 2014-02-21 NOTE — Progress Notes (Signed)
   Subjective:    Patient ID: Sophia Smith, female    DOB: [birth year], 53 y.o.   MRN: [id number]  HPI C/O LE swelling - Started about a week ago, then got worse on friday, bilateral ankle pain and swelling. Has happened before where has swollen.  No history of heart or kidney disease. No prior history of congestive heart failure. No fever, chills, or sweats.  No change in medication.  Last year had to have IV vancomycin to treat it.  Had done well until recently. Usually uses support stockings.  Feels really tright.    Review of Systems     Objective:   Physical Exam  Musculoskeletal:       Legs: 2+ pitting edema from below the knees bilaterally. It's more significant on the left side where she's had some old trauma and scarring. She has significant erythema that's approximately 20 cm in height from just above the ankle to just above the mid tibial area. It goes all the way around the leg even posteriorly over the calf. Please see the drawing.          Assessment & Plan:  Cellulitis of lower extremity-will try to keep her out of the hospital this time if possible. We discussed the importance of trying to get some extra extra fluid off to the antibiotics can go where they need to help heal the cellulitis. She is not a diabetic so it's most likely due to strep. Will treat with dicloxacillin every 6 hours 500 mg. Prescription sent to local pharmacy. We did give her 1.2 million units of penicillin IM here in the office. We will also give her 40 units of IM Lasix and will send her prescription for 40 mg of oral Lasix to take up to twice a day to help with the fluid off. She can continue her ACE wraps at home for compression and as soon as she is able to get enough swelling down to start wearing her compression stockings she will do so. Would like to make sure that she returns tomorrow for recheck. She would like something for pain control and she cannot take NSAIDs. Offered to give her tramadol but  she says it really didn't work well for her.. Also recommend using Tylenol for baseline pain. She does have 30 tabs in June provided of hydrocodone for pain. I will need to speak with her PCP about whether not it's okay to refill medication.

## 2014-02-22 ENCOUNTER — Ambulatory Visit (INDEPENDENT_AMBULATORY_CARE_PROVIDER_SITE_OTHER): Payer: Medicare PPO | Admitting: Physician Assistant

## 2014-02-22 ENCOUNTER — Encounter: Payer: Self-pay | Admitting: Physician Assistant

## 2014-02-22 VITALS — BP 114/68 | HR 92 | Ht 69.0 in | Wt 213.0 lb

## 2014-02-22 DIAGNOSIS — L02419 Cutaneous abscess of limb, unspecified: Secondary | ICD-10-CM

## 2014-02-22 DIAGNOSIS — Z79899 Other long term (current) drug therapy: Secondary | ICD-10-CM

## 2014-02-22 DIAGNOSIS — L03119 Cellulitis of unspecified part of limb: Secondary | ICD-10-CM

## 2014-02-22 LAB — CBC WITH DIFFERENTIAL/PLATELET
BASOS ABS: 0 10*3/uL (ref 0.0–0.1)
BASOS PCT: 0 % (ref 0–1)
EOS PCT: 2 % (ref 0–5)
Eosinophils Absolute: 0.2 10*3/uL (ref 0.0–0.7)
HCT: 35.3 % — ABNORMAL LOW (ref 36.0–46.0)
Hemoglobin: 11.6 g/dL — ABNORMAL LOW (ref 12.0–15.0)
LYMPHS PCT: 26 % (ref 12–46)
Lymphs Abs: 2.4 10*3/uL (ref 0.7–4.0)
MCH: 29.2 pg (ref 26.0–34.0)
MCHC: 32.9 g/dL (ref 30.0–36.0)
MCV: 88.9 fL (ref 78.0–100.0)
Monocytes Absolute: 0.9 10*3/uL (ref 0.1–1.0)
Monocytes Relative: 10 % (ref 3–12)
Neutro Abs: 5.7 10*3/uL (ref 1.7–7.7)
Neutrophils Relative %: 62 % (ref 43–77)
PLATELETS: 355 10*3/uL (ref 150–400)
RBC: 3.97 MIL/uL (ref 3.87–5.11)
RDW: 15 % (ref 11.5–15.5)
WBC: 9.2 10*3/uL (ref 4.0–10.5)

## 2014-02-22 MED ORDER — HYDROCODONE-ACETAMINOPHEN 10-325 MG PO TABS: ORAL_TABLET | ORAL | Status: DC

## 2014-02-22 NOTE — Progress Notes (Signed)
   Subjective:    Patient ID: Sophia Smith, female    DOB: [birth year], 53 y.o.   MRN: [id number]  HPI Patient presents to the clinic for one-day followup of bilateral lower extremity cellulitis. She was seen by Dr. Charise Carwin. She was given IM Lasix, penicillin injection, oral Lasix to take 40 mg twice a day and Keflex. She is taking 2 doses of Keflex. She does feel like the swelling has decreased somewhat and that she is going to the bathroom much more frequently. She continues to use ACE wraps as much through the day as she can. She denies any fever, chills, nausea or vomiting. She is taking Norco for pain control. Patient does have a history of being hospitalized and receiving IV vancomycin for cellulitis of the lower legs.   Review of Systems  All other systems reviewed and are negative.      Objective:   Physical Exam  Constitutional: She appears well-developed and well-nourished.  Skin:     1 1/2 + pitting edema bilateral leg with left leg worse than right. Warmth, redness and swelling present over both legs. No ulcerations or ozzing skin.           Assessment & Plan:  Lower extermity cellulitis- has not improved significantly but has not worsened either. Patient was encouraged to continue taking Keflex, taking Lasix twice a day, ACE wrapping her legs daily for as many hours as she can, elevating her feet. Patient was given the red flag warning signs to call our office immediately. If she is not continuing to improve please also call. Lab slip given for pt to have bmp drawn on Friday STAT before seeing Dr. Madilyn Fireman in my absence for follow up. Discussed with pt how we do need to keep a close follow up on her and if not improving may need to send to hospital. i did reprint her norco today and wrote for 4-6 hours for pain instead of just at bedtime. Voided rx written yesterday.

## 2014-02-22 NOTE — Patient Instructions (Signed)
Cellulitis Cellulitis is an infection of the skin and the tissue beneath it. The infected area is usually red and tender. Cellulitis occurs most often in the arms and lower legs.  CAUSES  Cellulitis is caused by bacteria that enter the skin through cracks or cuts in the skin. The most common types of bacteria that cause cellulitis are Staphylococcus and Streptococcus. SYMPTOMS   Redness and warmth.  Swelling.  Tenderness or pain.  Fever. DIAGNOSIS  Your caregiver can usually determine what is wrong based on a physical exam. Blood tests may also be done. TREATMENT  Treatment usually involves taking an antibiotic medicine. HOME CARE INSTRUCTIONS   Take your antibiotics as directed. Finish them even if you start to feel better.  Keep the infected arm or leg elevated to reduce swelling.  Apply a warm cloth to the affected area up to 4 times per day to relieve pain.  Only take over-the-counter or prescription medicines for pain, discomfort, or fever as directed by your caregiver.  Keep all follow-up appointments as directed by your caregiver. SEEK MEDICAL CARE IF:   You notice red streaks coming from the infected area.  Your red area gets larger or turns dark in color.  Your bone or joint underneath the infected area becomes painful after the skin has healed.  Your infection returns in the same area or another area.  You notice a swollen bump in the infected area.  You develop new symptoms. SEEK IMMEDIATE MEDICAL CARE IF:   You have a fever.  You feel very sleepy.  You develop vomiting or diarrhea.  You have a general ill feeling (malaise) with muscle aches and pains. MAKE SURE YOU:   Understand these instructions.  Will watch your condition.  Will get help right away if you are not doing well or get worse. Document Released: 05/01/2005 Document Revised: 01/21/2012 Document Reviewed: 10/07/2011 [employer] Patient Information 2015 [employer]. This information is  not intended to replace advice given to you by your health care provider. Make sure you discuss any questions you have with your health care provider.  

## 2014-02-25 ENCOUNTER — Ambulatory Visit (INDEPENDENT_AMBULATORY_CARE_PROVIDER_SITE_OTHER): Payer: Medicare PPO | Admitting: Family Medicine

## 2014-02-25 ENCOUNTER — Encounter: Payer: Self-pay | Admitting: Family Medicine

## 2014-02-25 VITALS — BP 127/79 | HR 88 | Ht 69.0 in | Wt 209.0 lb

## 2014-02-25 DIAGNOSIS — L02419 Cutaneous abscess of limb, unspecified: Secondary | ICD-10-CM

## 2014-02-25 DIAGNOSIS — L03119 Cellulitis of unspecified part of limb: Secondary | ICD-10-CM

## 2014-02-25 NOTE — Progress Notes (Signed)
   Subjective:    Patient ID: Sophia Smith, female    DOB: 30-Jun-1961, 53 y.o.   MRN: [id number]  HPI F/U cellulitis - still sore and tender but much better. Says rough last 2 days, but feels better today.  Taking Lasix BID.  No muscle cramps. No fever, shills or sweats.  She is really trying to keep them elevated over the last couple days and has been using her ACE wraps.   Review of Systems     Objective:   Physical Exam  Constitutional: She is oriented to person, place, and time. She appears well-developed and well-nourished.  HENT:  Head: Normocephalic and atraumatic.  Neurological: She is alert and oriented to person, place, and time.  Skin: Skin is warm and dry.  Still with some mild erythema over the lower extremities. Doesn't look nearly as red or angry. Less tender to touch. She still has 1+ pitting edema bilaterally. Am actually able to see some wrinkles over her ankles now.  Psychiatric: She has a normal mood and affect. Her behavior is normal.          Assessment & Plan:  Cellulitis- improving but not completely resolved. Make sure complete the antibiotic. She still has about 5 days left. Followup at that time if she doesn't feel her symptoms are completely resolved. She did go for BMP today just to make sure that her electrolytes and kidney function are K. since we have increased her Lasix to twice a day this week. If she doesn't hear from me by the end is today then she is to go back down to 1 tab daily over the weekend.

## 2014-02-26 LAB — BASIC METABOLIC PANEL
BUN: 8 mg/dL (ref 6–23)
CALCIUM: 8.5 mg/dL (ref 8.4–10.5)
CO2: 24 mEq/L (ref 19–32)
CREATININE: 0.68 mg/dL (ref 0.50–1.10)
Chloride: 110 mEq/L (ref 96–112)
Glucose, Bld: 129 mg/dL — ABNORMAL HIGH (ref 70–99)
Potassium: 3.7 mEq/L (ref 3.5–5.3)
Sodium: 142 mEq/L (ref 135–145)

## 2014-03-03 ENCOUNTER — Other Ambulatory Visit: Payer: Self-pay | Admitting: *Deleted

## 2014-03-03 ENCOUNTER — Telehealth: Payer: Self-pay | Admitting: *Deleted

## 2014-03-03 MED ORDER — HYDROCODONE-ACETAMINOPHEN 10-325 MG PO TABS: ORAL_TABLET | ORAL | Status: DC

## 2014-03-03 NOTE — Telephone Encounter (Signed)
Pt left vm stating that her husband has his surgery yesterday & is in the ICU now.  She took her last pain pill today & she states that she has got to have something since she has to go back & forth from home to [place] with her husband.  She wants to know if you'll refill it for her.  Please advise.

## 2014-03-03 NOTE — Telephone Encounter (Signed)
Ok to fill 

## 2014-03-03 NOTE — Telephone Encounter (Signed)
Pt.notified

## 2014-03-21 ENCOUNTER — Ambulatory Visit (INDEPENDENT_AMBULATORY_CARE_PROVIDER_SITE_OTHER): Payer: Medicare PPO | Admitting: Physician Assistant

## 2014-03-21 ENCOUNTER — Encounter: Payer: Self-pay | Admitting: Physician Assistant

## 2014-03-21 VITALS — BP 125/78 | HR 90 | Ht 69.0 in | Wt 205.0 lb

## 2014-03-21 DIAGNOSIS — R609 Edema, unspecified: Secondary | ICD-10-CM

## 2014-03-21 DIAGNOSIS — L02419 Cutaneous abscess of limb, unspecified: Secondary | ICD-10-CM

## 2014-03-21 DIAGNOSIS — L03116 Cellulitis of left lower limb: Principal | ICD-10-CM

## 2014-03-21 DIAGNOSIS — L03119 Cellulitis of unspecified part of limb: Secondary | ICD-10-CM

## 2014-03-21 DIAGNOSIS — L03115 Cellulitis of right lower limb: Secondary | ICD-10-CM

## 2014-03-21 DIAGNOSIS — M722 Plantar fascial fibromatosis: Secondary | ICD-10-CM | POA: Insufficient documentation

## 2014-03-21 MED ORDER — HYDROCODONE-ACETAMINOPHEN 10-325 MG PO TABS: ORAL_TABLET | ORAL | Status: DC

## 2014-03-21 MED ORDER — CEPHALEXIN 500 MG PO CAPS: 500.0000 mg | ORAL_CAPSULE | Freq: Two times a day (BID) | ORAL | Status: DC

## 2014-03-21 MED ORDER — FUROSEMIDE 40 MG PO TABS: 40.0000 mg | ORAL_TABLET | Freq: Two times a day (BID) | ORAL | Status: DC

## 2014-03-21 MED ORDER — FUROSEMIDE 10 MG/ML IJ SOLN
40.0000 mg | Freq: Once | INTRAMUSCULAR | Status: AC
  Administered 2014-03-21: 40 mg via INTRAMUSCULAR

## 2014-03-23 NOTE — Progress Notes (Signed)
   Subjective:    Patient ID: Sophia Smith, female    DOB: 02/19/1961, 53 y.o.   MRN: [id number]  HPI Patient is a 53 year old female who presents to the clinic with bilateral swelling of both legs and redness. She has noticed this for the last week. She feels like her swelling is getting worse as well as redness. She is starting to have a lot of discomfort mostly in her left leg. She denies any fever, chills, nausea or vomiting. She recently finished Keflex from cellulitis of legs. Her swelling had been doing much better and she was wearing compression stockings. Her legs are so tight now she is not able to wear her compression stockings but does admit to rubbing in her Ace bandage. She did stop the Lasix when her legs got better. She started back yesterday. She is taking Lasix 40 mg once a day. She does complain of a lot of pain. When her legs get like this she feels like it makes her knees worse as well as her hips.    Review of Systems  All other systems reviewed and are negative.      Objective:   Physical Exam  Constitutional: She is oriented to person, place, and time. She appears well-developed and well-nourished.  HENT:  Head: Normocephalic and atraumatic.  Cardiovascular: Normal rate, regular rhythm and normal heart sounds.   Pulmonary/Chest: Effort normal and breath sounds normal. She has no wheezes.  Neurological: She is alert and oriented to person, place, and time.  Skin:  1+ edema right ankle and leg up to mid calf. Minimal erythema and tenderness.   2+ edema and significant erythema over left ankle and up leg into knee. Significant tenderness. No open sores or lesions.   Psychiatric: She has a normal mood and affect. Her behavior is normal.          Assessment & Plan:  Cellulitis bilateral legs- I am Lasix 40 mg was given in office today. Patient was instructed to go back on Lasix 40 mg twice a day at least for the next 4 days. She can then back off to once a day  and if still improving half tab once a day. Patient was started on Keflex 500 mg twice daily for the next 10 days. Patient encouraged to continued Ace wraps. When her legs do get to regular size to use compression stocking. It is in her best interest to stay on a small dose of Lasix until we can recheck her legs. Pt is in pain and norco given. Currently I think pt is in a pain domino effect. Swelling leg effects knees which effect hips. Increased to up to 3 times a day. Followup by Friday if not improving. If improving can followup in one to 2 weeks.

## 2014-04-04 ENCOUNTER — Encounter: Payer: Self-pay | Admitting: Physician Assistant

## 2014-04-04 ENCOUNTER — Ambulatory Visit (INDEPENDENT_AMBULATORY_CARE_PROVIDER_SITE_OTHER): Payer: Medicare PPO | Admitting: Physician Assistant

## 2014-04-04 VITALS — BP 116/75 | HR 88 | Ht 69.0 in | Wt 192.0 lb

## 2014-04-04 DIAGNOSIS — IMO0002 Reserved for concepts with insufficient information to code with codable children: Secondary | ICD-10-CM

## 2014-04-04 DIAGNOSIS — R600 Localized edema: Secondary | ICD-10-CM

## 2014-04-04 DIAGNOSIS — Z23 Encounter for immunization: Secondary | ICD-10-CM

## 2014-04-04 DIAGNOSIS — M1710 Unilateral primary osteoarthritis, unspecified knee: Secondary | ICD-10-CM

## 2014-04-04 DIAGNOSIS — R609 Edema, unspecified: Secondary | ICD-10-CM

## 2014-04-04 NOTE — Patient Instructions (Addendum)
Decrease down to 20mg  of lasix. If fluid returns then can increase to 40mg .

## 2014-04-04 NOTE — Progress Notes (Signed)
   Subjective:    Patient ID: Dava Rensch, female    DOB: 1961-07-30, 53 y.o.   MRN: [id number]  HPI Pt is a 53 yo female who presents to the clinic for 2 week follow up from cellulitis. Pt is much improved today. She has decreased down to 40mg  of lasix daily. Denies any pain and swelling is much better. She is not wearing compression hose or ace wraps at this time. She continues to be in a lot of pain with bilateral knees and ankles. She has appt with surgeon on September 8th.   Review of Systems  All other systems reviewed and are negative.      Objective:   Physical Exam  Constitutional: She is oriented to person, place, and time. She appears well-developed and well-nourished.  HENT:  Head: Normocephalic and atraumatic.  Cardiovascular: Normal rate, regular rhythm and normal heart sounds.   Pulmonary/Chest: Effort normal and breath sounds normal.  Neurological: She is alert and oriented to person, place, and time.  Skin:  Scant edema bilaterally around ankles. No pain with palpation over legs. Circulation good.   Psychiatric: She has a normal mood and affect. Her behavior is normal.          Assessment & Plan:  Bilateral lower extermity edema/follow up cellulitis- cellulitis resolved. Swelling much improved. Discussed with pt the need for wearing compression stockings daily for swelling control. I would like for her to decrease lasix down to 20mg  daily to see if swelling still control and at a low dose. If swelling worsens can increase to 40mg  daily. Keep legs elevated. Low salt diet. Follow up as needed.   tri compartment DDD- on norco TID. i am not willing to increase this. appt with surgeon on sept 8th. If surgery is option then surgeon will take over narcotics. If no surgery she needs to go to pain clinic. Discussed with pt she does need to supplement with tylenol, warm compresses, voltaren gel for pain relief.   Flu shot given today without complications.

## 2014-04-18 ENCOUNTER — Telehealth: Payer: Self-pay | Admitting: *Deleted

## 2014-04-18 ENCOUNTER — Other Ambulatory Visit: Payer: Self-pay | Admitting: *Deleted

## 2014-04-18 MED ORDER — HYDROCODONE-ACETAMINOPHEN 10-325 MG PO TABS: ORAL_TABLET | ORAL | Status: DC

## 2014-04-18 NOTE — Telephone Encounter (Signed)
Refill given to pt in office today and appt was made for Wed for her surgical clearance.

## 2014-04-18 NOTE — Telephone Encounter (Signed)
Pt left vm asking if you would refill her hydrocodone one more time.  She is now scheduled for her knee surgery on 10/16 and she said the ortho won't give her any meds until her surgery.  She said she ran out last fri and has had a really rough weekend.  Please advise.

## 2014-04-18 NOTE — Telephone Encounter (Signed)
Ok

## 2014-04-20 ENCOUNTER — Encounter: Payer: Self-pay | Admitting: Physician Assistant

## 2014-04-20 ENCOUNTER — Ambulatory Visit (INDEPENDENT_AMBULATORY_CARE_PROVIDER_SITE_OTHER): Payer: Medicare PPO | Admitting: Physician Assistant

## 2014-04-20 VITALS — BP 134/85 | HR 84 | Ht 69.0 in | Wt 194.0 lb

## 2014-04-20 DIAGNOSIS — Z136 Encounter for screening for cardiovascular disorders: Secondary | ICD-10-CM

## 2014-04-20 DIAGNOSIS — Z01818 Encounter for other preprocedural examination: Secondary | ICD-10-CM

## 2014-04-20 LAB — CBC WITH DIFFERENTIAL/PLATELET
Basophils Absolute: 0.1 10*3/uL (ref 0.0–0.1)
Basophils Relative: 1 % (ref 0–1)
Eosinophils Absolute: 0.2 10*3/uL (ref 0.0–0.7)
Eosinophils Relative: 2 % (ref 0–5)
HCT: 38.2 % (ref 36.0–46.0)
HEMOGLOBIN: 12.8 g/dL (ref 12.0–15.0)
LYMPHS ABS: 2.9 10*3/uL (ref 0.7–4.0)
LYMPHS PCT: 34 % (ref 12–46)
MCH: 29.4 pg (ref 26.0–34.0)
MCHC: 33.5 g/dL (ref 30.0–36.0)
MCV: 87.8 fL (ref 78.0–100.0)
MONOS PCT: 8 % (ref 3–12)
Monocytes Absolute: 0.7 10*3/uL (ref 0.1–1.0)
NEUTROS ABS: 4.7 10*3/uL (ref 1.7–7.7)
NEUTROS PCT: 55 % (ref 43–77)
Platelets: 363 10*3/uL (ref 150–400)
RBC: 4.35 MIL/uL (ref 3.87–5.11)
RDW: 14.6 % (ref 11.5–15.5)
WBC: 8.6 10*3/uL (ref 4.0–10.5)

## 2014-04-20 LAB — COMPLETE METABOLIC PANEL WITH GFR
ALBUMIN: 3.8 g/dL (ref 3.5–5.2)
ALT: <8 U/L (ref 0–35)
AST: 10 U/L (ref 0–37)
Alkaline Phosphatase: 101 U/L (ref 39–117)
BUN: 17 mg/dL (ref 6–23)
CHLORIDE: 107 meq/L (ref 96–112)
CO2: 28 meq/L (ref 19–32)
Calcium: 9.1 mg/dL (ref 8.4–10.5)
Creat: 0.58 mg/dL (ref 0.50–1.10)
GLUCOSE: 78 mg/dL (ref 70–99)
POTASSIUM: 4.1 meq/L (ref 3.5–5.3)
Sodium: 141 mEq/L (ref 135–145)
TOTAL PROTEIN: 6 g/dL (ref 6.0–8.3)
Total Bilirubin: 0.3 mg/dL (ref 0.2–1.2)

## 2014-04-20 NOTE — Progress Notes (Signed)
   Subjective:    Patient ID: Sophia Smith, female    DOB: 09-25-1960, 53 y.o.   MRN: [id number]  HPI Pt presents to get surgical clearance for left knee replacement October 16th. She is doing great with no concerns.    Review of Systems  All other systems reviewed and are negative.      Objective:   Physical Exam  Constitutional: She is oriented to person, place, and time. She appears well-developed and well-nourished.  HENT:  Head: Normocephalic and atraumatic.  Right Ear: External ear normal.  Left Ear: External ear normal.  Nose: Nose normal.  Mouth/Throat: Oropharynx is clear and moist.  Eyes: Conjunctivae are normal. Right eye exhibits no discharge. Left eye exhibits no discharge.  Neck: Normal range of motion. Neck supple.  Cardiovascular: Normal rate, regular rhythm and normal heart sounds.   Pulmonary/Chest: Effort normal and breath sounds normal.  Lymphadenopathy:    She has no cervical adenopathy.  Neurological: She is alert and oriented to person, place, and time.  Skin: Skin is dry.  Legs look great. No edema.   Psychiatric: She has a normal mood and affect. Her behavior is normal.          Assessment & Plan:  Surgical clearance-  EKG- NSR, no ST depression or elevation, no acute changes, no arrthymias.  Will get CMP and CBC.  Doing great. No other concerns. NO signs of infection.  Will fax blood work pending labs.

## 2014-05-12 NOTE — Addendum Note (Signed)
Addended by: Narda Rutherford on: 05/12/2014 08:20 AM   Modules accepted: Orders

## 2014-09-13 ENCOUNTER — Ambulatory Visit (INDEPENDENT_AMBULATORY_CARE_PROVIDER_SITE_OTHER): Payer: Medicare PPO | Admitting: Physician Assistant

## 2014-09-13 ENCOUNTER — Encounter: Payer: Self-pay | Admitting: Physician Assistant

## 2014-09-13 VITALS — BP 137/87 | HR 103 | Temp 98.8°F | Ht 69.0 in | Wt 192.0 lb

## 2014-09-13 DIAGNOSIS — M549 Dorsalgia, unspecified: Secondary | ICD-10-CM | POA: Diagnosis not present

## 2014-09-13 DIAGNOSIS — Z96652 Presence of left artificial knee joint: Secondary | ICD-10-CM | POA: Diagnosis not present

## 2014-09-13 DIAGNOSIS — M25474 Effusion, right foot: Secondary | ICD-10-CM

## 2014-09-13 DIAGNOSIS — Z96659 Presence of unspecified artificial knee joint: Secondary | ICD-10-CM | POA: Insufficient documentation

## 2014-09-13 DIAGNOSIS — M25572 Pain in left ankle and joints of left foot: Secondary | ICD-10-CM

## 2014-09-13 DIAGNOSIS — J209 Acute bronchitis, unspecified: Secondary | ICD-10-CM

## 2014-09-13 MED ORDER — PREDNISONE 50 MG PO TABS: ORAL_TABLET | ORAL | Status: DC

## 2014-09-13 MED ORDER — HYDROCODONE-HOMATROPINE 5-1.5 MG/5ML PO SYRP: 5.0000 mL | ORAL_SOLUTION | Freq: Every evening | ORAL | Status: DC | PRN

## 2014-09-13 MED ORDER — DOXYCYCLINE HYCLATE 100 MG PO TABS: 100.0000 mg | ORAL_TABLET | Freq: Two times a day (BID) | ORAL | Status: DC

## 2014-09-13 MED ORDER — IPRATROPIUM-ALBUTEROL 0.5-2.5 (3) MG/3ML IN SOLN
3.0000 mL | Freq: Once | RESPIRATORY_TRACT | Status: AC
  Administered 2014-09-13: 3 mL via RESPIRATORY_TRACT

## 2014-09-13 MED ORDER — ALBUTEROL SULFATE 108 (90 BASE) MCG/ACT IN AEPB: 2.0000 | INHALATION_SPRAY | RESPIRATORY_TRACT | Status: DC | PRN

## 2014-09-13 NOTE — Patient Instructions (Signed)

## 2014-09-13 NOTE — Progress Notes (Signed)
   Subjective:    Patient ID: Sophia Smith, female    DOB: May 14, 1961, 54 y.o.   MRN: [id number]  HPI  Pt presents to the clinic with 5 days of cough, wheezing, SOB that is persistent and getting worse. Smokes daily. No inhalers. Hx of bronchitis. No fever or chills. Some nasal congestion and drainage but mostly productive cough. Since cough started noticed some mid back pain.  Not tried anything OTC.   S/P left total knee replacement. Doing well with left knee. No pain and doing great. Now left ankle hurting. Seeing dr. Jetta Lout for treatment. Had multiple episodes of cellulitis while recovering from knee replacement. Right foot and ankle are swollen today. Worried about cellulitis forming due to hx. Not taken lasix recently. No fever.    Review of Systems  All other systems reviewed and are negative.      Objective:   Physical Exam  Constitutional: She is oriented to person, place, and time. She appears well-developed and well-nourished.  HENT:  Head: Normocephalic and atraumatic.  Right Ear: External ear normal.  Left Ear: External ear normal.  Nose: Nose normal.  Mouth/Throat: Oropharynx is clear and moist.  Eyes: Conjunctivae are normal. Right eye exhibits no discharge. Left eye exhibits no discharge.  Neck: Normal range of motion. Neck supple.  Cardiovascular: Normal rate, regular rhythm and normal heart sounds.   Pulmonary/Chest:  Bilateral lung wheezing and rhonchi.   Musculoskeletal:  Right foot 2+ edema up into ankle. No erythema or warmth.   Lymphadenopathy:    She has no cervical adenopathy.  Neurological: She is alert and oriented to person, place, and time.  Skin: Skin is dry.  Psychiatric: She has a normal mood and affect. Her behavior is normal.          Assessment & Plan:  Acute bronchitis/wheezing/mid back pain- duoneb given in office today. Will give doxycycline and prednisone taper. Hycodan for cough at bedtime. Cough be some pleursiy from cough or  costrochondritis. Prednisone should help. Albuterol given to use as needed every 2-4 hours for SOB or wheezing.    Swelling right ankle and foot- take lasix that you have at home one tablet as needed. i don't see any signs of cellulitis today but doxycycline will cover cellulitis if starts to develop.  Compression with ace wrap. Follow up if not improving.

## 2014-12-05 ENCOUNTER — Telehealth: Payer: Self-pay | Admitting: Physician Assistant

## 2014-12-05 NOTE — Telephone Encounter (Signed)
Received information that Pt had contacted the call center over the weekend for possible bilateral leg cellulitis. Pt was in the hospital for this in March and was sent home with an Rx for Clindamycin 300mg  capsules, TID. Pt reports she has been taking this Rx TID and is "a little better." Her legs are still red and sore but the edema and tightness has gotten slightly better. Clarified no new allergies and Pt preferred pharmacy is [facility] on [address].

## 2014-12-05 NOTE — Telephone Encounter (Signed)
Does he have a full prescription of clindamycin?  Is she using light compression?  Is she taking fluid pill and how much?

## 2014-12-06 NOTE — Telephone Encounter (Signed)
Attempted to contact Pt regarding Rx and current treatment regime, no answer. Left voicemail providing call back information.

## 2014-12-07 NOTE — Telephone Encounter (Signed)
Ok to send for 3 more days of abx. So pt can finish treatment.

## 2014-12-07 NOTE — Telephone Encounter (Signed)
Pt left vm yesterday evening stating that she was only short about 2 days of the abx.

## 2014-12-08 ENCOUNTER — Other Ambulatory Visit: Payer: Self-pay | Admitting: *Deleted

## 2014-12-08 MED ORDER — CLINDAMYCIN HCL 300 MG PO CAPS: 300.0000 mg | ORAL_CAPSULE | Freq: Three times a day (TID) | ORAL | Status: DC

## 2014-12-08 NOTE — Telephone Encounter (Signed)
Rx sent.  Pt notified. 

## 2015-06-01 ENCOUNTER — Encounter: Payer: Self-pay | Admitting: Physician Assistant

## 2015-06-01 ENCOUNTER — Ambulatory Visit (INDEPENDENT_AMBULATORY_CARE_PROVIDER_SITE_OTHER): Payer: Medicare PPO | Admitting: Physician Assistant

## 2015-06-01 VITALS — BP 138/72 | HR 96 | Ht 69.0 in | Wt 207.0 lb

## 2015-06-01 DIAGNOSIS — M546 Pain in thoracic spine: Secondary | ICD-10-CM

## 2015-06-01 DIAGNOSIS — G8929 Other chronic pain: Secondary | ICD-10-CM

## 2015-06-01 DIAGNOSIS — M549 Dorsalgia, unspecified: Secondary | ICD-10-CM | POA: Insufficient documentation

## 2015-06-01 DIAGNOSIS — F430 Acute stress reaction: Secondary | ICD-10-CM

## 2015-06-01 DIAGNOSIS — G4701 Insomnia due to medical condition: Secondary | ICD-10-CM | POA: Insufficient documentation

## 2015-06-01 DIAGNOSIS — G4700 Insomnia, unspecified: Secondary | ICD-10-CM

## 2015-06-01 MED ORDER — ZOLPIDEM TARTRATE 5 MG PO TABS: 5.0000 mg | ORAL_TABLET | Freq: Every evening | ORAL | Status: DC | PRN

## 2015-06-01 MED ORDER — LORAZEPAM 0.5 MG PO TABS: 0.5000 mg | ORAL_TABLET | Freq: Two times a day (BID) | ORAL | Status: DC | PRN

## 2015-06-01 MED ORDER — ORPHENADRINE CITRATE ER 100 MG PO TB12: 100.0000 mg | ORAL_TABLET | Freq: Two times a day (BID) | ORAL | Status: DC

## 2015-06-01 NOTE — Progress Notes (Signed)
   Subjective:    Patient ID: Sophia Smith, female    DOB: Jun 20, 1961, 54 y.o.   MRN: [id number]  HPI Pt presents to the clinic with right upper back pain and tightness. No injury or trauma. Right upper back felt achy and tight for the last 4 weeks. NSAIDs hurt her belly. Tylenol not really helping. No radiation or pain. Flexeril makes her too sleepy. She is under a lot of stress. 3 weeks ago her husband left her. She can't sleep. She feels like she is holding her stress in her shoulders and back.    Review of Systems  All other systems reviewed and are negative.      Objective:   Physical Exam  Constitutional: She is oriented to person, place, and time. She appears well-developed and well-nourished.  HENT:  Head: Normocephalic and atraumatic.  Cardiovascular: Normal rate, regular rhythm and normal heart sounds.   Pulmonary/Chest: Effort normal and breath sounds normal.  Musculoskeletal:  No spine tenderness. Tenderness and tightness over right upper back.  NROM of upper extremities.   Neurological: She is alert and oriented to person, place, and time.  Psychiatric: Her behavior is normal.  Tearful when discussing her husband leaving.           Assessment & Plan:  Upper back pain- I do think this is muscular likely due to her increased stress. Stop flexeril. Try norflex. Continue neurontin. Toradol 60mg  IM given today. Discussed warm bath and massage. Could also try essential oils. Home exercises for upper back given today.   Insomnia/acute stress reaction- continue celexa. Ativan given to use as needed. Discussed side effects and abuse potential. For sleep stop trazodone and try ambien. Consider counseling.

## 2015-06-07 ENCOUNTER — Telehealth: Payer: Self-pay | Admitting: *Deleted

## 2015-06-07 NOTE — Telephone Encounter (Signed)
Pt called today stating that she right shoulder is hurting worse now and she said that you mentioned that you could send her in some prednisone.

## 2015-06-07 NOTE — Telephone Encounter (Signed)
Ok to send prednisone 50mg  I po qd #5 NRF

## 2015-06-08 ENCOUNTER — Other Ambulatory Visit: Payer: Self-pay | Admitting: *Deleted

## 2015-06-08 MED ORDER — PREDNISONE 50 MG PO TABS: ORAL_TABLET | ORAL | Status: DC

## 2015-06-08 NOTE — Telephone Encounter (Signed)
Prednisone sent to pharm.  Pt notified.

## 2015-12-01 LAB — HM COLONOSCOPY

## 2015-12-05 ENCOUNTER — Encounter: Payer: Self-pay | Admitting: Student

## 2015-12-05 DIAGNOSIS — K648 Other hemorrhoids: Secondary | ICD-10-CM | POA: Insufficient documentation

## 2015-12-07 ENCOUNTER — Encounter: Payer: Self-pay | Admitting: Physician Assistant

## 2016-07-24 ENCOUNTER — Encounter: Payer: Self-pay | Admitting: *Deleted

## 2016-07-24 ENCOUNTER — Emergency Department
Admission: EM | Admit: 2016-07-24 | Discharge: 2016-07-24 | Disposition: A | Discharge status: Home / Self Care | Payer: Medicare PPO | Source: Home / Self Care | Attending: Family Medicine | Admitting: Family Medicine

## 2016-07-24 DIAGNOSIS — L03116 Cellulitis of left lower limb: Secondary | ICD-10-CM | POA: Diagnosis not present

## 2016-07-24 DIAGNOSIS — D649 Anemia, unspecified: Secondary | ICD-10-CM

## 2016-07-24 DIAGNOSIS — L03115 Cellulitis of right lower limb: Secondary | ICD-10-CM | POA: Diagnosis not present

## 2016-07-24 DIAGNOSIS — M7989 Other specified soft tissue disorders: Secondary | ICD-10-CM

## 2016-07-24 LAB — POCT CBC W AUTO DIFF (K'VILLE URGENT CARE)

## 2016-07-24 MED ORDER — CLINDAMYCIN HCL 300 MG PO CAPS: 300.0000 mg | ORAL_CAPSULE | Freq: Four times a day (QID) | ORAL | 0 refills | Status: DC
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
## 2016-07-24 NOTE — ED Triage Notes (Signed)
Pt c/o bilateral leg pain and redness x 2 days. LT leg is worse and draining. She has an appt with the pain clinic tomorrow for a recheck after having a reaction to [place].

## 2016-07-25 ENCOUNTER — Telehealth: Payer: Self-pay | Admitting: *Deleted

## 2016-07-25 LAB — CBC WITH DIFFERENTIAL/PLATELET
Basophils Absolute: 0 cells/uL (ref 0–200)
Basophils Relative: 0 %
Eosinophils Absolute: 89 cells/uL (ref 15–500)
Eosinophils Relative: 1 %
HCT: 28 % — ABNORMAL LOW (ref 35.0–45.0)
Hemoglobin: 8.4 g/dL — ABNORMAL LOW (ref 11.7–15.5)
Lymphocytes Relative: 16 %
Lymphs Abs: 1424 cells/uL (ref 850–3900)
MCH: 22.5 pg — ABNORMAL LOW (ref 27.0–33.0)
MCHC: 30 g/dL — ABNORMAL LOW (ref 32.0–36.0)
MCV: 75.1 fL — ABNORMAL LOW (ref 80.0–100.0)
MPV: 8.5 fL (ref 7.5–12.5)
Monocytes Absolute: 890 cells/uL (ref 200–950)
Monocytes Relative: 10 %
Neutro Abs: 6497 cells/uL (ref 1500–7800)
Neutrophils Relative %: 73 %
Platelets: 366 10*3/uL (ref 140–400)
RBC: 3.73 MIL/uL — ABNORMAL LOW (ref 3.80–5.10)
RDW: 18.8 % — ABNORMAL HIGH (ref 11.0–15.0)
WBC: 8.9 10*3/uL (ref 3.8–10.8)

## 2016-07-25 NOTE — Telephone Encounter (Signed)
Callback: Patient given lab results and discussed. Encouraged f/u with PCP to ensure anemia is not chronic. Encouraged completion of antibiotic course.

## 2016-09-19 LAB — HM PAP SMEAR: HM Pap smear: NEGATIVE

## 2016-12-13 ENCOUNTER — Other Ambulatory Visit: Payer: Self-pay | Admitting: Pain Medicine

## 2016-12-13 DIAGNOSIS — M545 Low back pain: Secondary | ICD-10-CM

## 2017-01-20 ENCOUNTER — Ambulatory Visit (INDEPENDENT_AMBULATORY_CARE_PROVIDER_SITE_OTHER): Payer: Medicare PPO

## 2017-01-20 DIAGNOSIS — M545 Low back pain: Secondary | ICD-10-CM

## 2017-01-20 DIAGNOSIS — M479 Spondylosis, unspecified: Secondary | ICD-10-CM

## 2017-01-20 DIAGNOSIS — M5136 Other intervertebral disc degeneration, lumbar region: Secondary | ICD-10-CM

## 2017-01-20 DIAGNOSIS — M5126 Other intervertebral disc displacement, lumbar region: Secondary | ICD-10-CM | POA: Diagnosis not present

## 2017-05-13 ENCOUNTER — Ambulatory Visit (INDEPENDENT_AMBULATORY_CARE_PROVIDER_SITE_OTHER): Payer: Medicare PPO | Admitting: Family Medicine

## 2017-05-13 ENCOUNTER — Encounter: Payer: Self-pay | Admitting: Family Medicine

## 2017-05-13 VITALS — BP 166/98 | HR 100 | Wt 160.0 lb

## 2017-05-13 DIAGNOSIS — T792XXA Traumatic secondary and recurrent hemorrhage and seroma, initial encounter: Secondary | ICD-10-CM | POA: Insufficient documentation

## 2017-05-13 DIAGNOSIS — D509 Iron deficiency anemia, unspecified: Secondary | ICD-10-CM | POA: Insufficient documentation

## 2017-05-13 MED ORDER — CEPHALEXIN 500 MG PO CAPS: 500.0000 mg | ORAL_CAPSULE | Freq: Three times a day (TID) | ORAL | 0 refills | Status: DC
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
## 2017-05-13 NOTE — Patient Instructions (Signed)
Thank you for coming in today. Get labs now.  Recheck with me in 1 week.  Return sooner if needed.    Seroma A seroma is a collection of fluid on the body that looks like swelling or a mass. Seromas form where tissue has been injured or cut. Seromas vary in size. Some are small and painless. Others may become large and cause pain or discomfort. Many seromas go away on their own as the fluid is naturally absorbed by the body, and some seromas need to be drained. What are the causes? Seromas form as the result of damage to tissue or the removal of tissue. This tissue damage may occur during surgery or because of an injury or trauma. When tissue is disrupted or removed, empty space is created. The body's natural defense system (immune system) causes fluid to enter the empty space and form a seroma. What are the signs or symptoms? Symptoms of this condition include:  Swelling at the site of a surgical cut (incision) or an injury.  Drainage of clear fluid at the surgery or injury site.  Discomfort or pain.  How is this diagnosed? This condition is diagnosed based on your symptoms, your medical history, and a physical exam. During the exam, your health care provider will press on the seroma. You may also have tests, including:  Blood tests.  Imaging tests, such as an ultrasound or CT scan.  How is this treated? Some seromas go away (resolve) on their own. Your health care provider may monitor you to make sure the seroma does not cause any complications. If your seroma does not resolve on its own, treatment may include:  Using a needle to drain the fluid from the seroma (needle aspiration).  Inserting a flexible tube (catheter) to drain the fluid.  Applying a bandage (dressing), such as an elastic bandage or binder.  Antibiotic medicines, if the seroma becomes infected.  In rare cases, surgery may be done to remove the seroma and repair the area. Follow these instructions at  home:  If you were prescribed an antibiotic medicine, take it as told by your health care provider. Do not stop taking the antibiotic even if you start to feel better.  Return to your normal activities as told by your health care provider. Ask your health care provider what activities are safe for you.  Take over-the-counter and prescription medicines only as told by your health care provider.  Check your seroma every day for signs of infection. Check for: ? Redness or pain. ? Fluid or pus. ? More swelling. ? Warmth.  Keep all follow-up visits as told by your health care provider. This is important. Contact a health care provider if:  You have a fever.  You have redness or pain at the site of the seroma.  You have fluid or pus coming from the seroma.  Your seroma is more swollen or is getting bigger.  Your seroma is warm to the touch. This information is not intended to replace advice given to you by your health care provider. Make sure you discuss any questions you have with your health care provider. Document Released: 11/16/2012 Document Revised: 05/03/2016 Document Reviewed: 05/03/2016 Elsevier Interactive Patient Education  [employer].

## 2017-05-14 LAB — COMPLETE METABOLIC PANEL WITHOUT GFR
AG Ratio: 1.6 (calc) (ref 1.0–2.5)
ALT: 10 U/L (ref 6–29)
AST: 16 U/L (ref 10–35)
Albumin: 4 g/dL (ref 3.6–5.1)
Alkaline phosphatase (APISO): 102 U/L (ref 33–130)
BUN: 12 mg/dL (ref 7–25)
CO2: 27 mmol/L (ref 20–32)
Calcium: 9.1 mg/dL (ref 8.6–10.4)
Chloride: 102 mmol/L (ref 98–110)
Creat: 0.64 mg/dL (ref 0.50–1.05)
GFR, Est African American: 116 mL/min/1.73m2
GFR, Est Non African American: 100 mL/min/1.73m2
Globulin: 2.5 g/dL (ref 1.9–3.7)
Glucose, Bld: 99 mg/dL (ref 65–99)
Potassium: 4.1 mmol/L (ref 3.5–5.3)
Sodium: 138 mmol/L (ref 135–146)
Total Bilirubin: 0.3 mg/dL (ref 0.2–1.2)
Total Protein: 6.5 g/dL (ref 6.1–8.1)

## 2017-05-14 LAB — CBC
HCT: 37.5 % (ref 35.0–45.0)
Hemoglobin: 11.7 g/dL (ref 11.7–15.5)
MCH: 23.2 pg — ABNORMAL LOW (ref 27.0–33.0)
MCHC: 31.2 g/dL — ABNORMAL LOW (ref 32.0–36.0)
MCV: 74.4 fL — AB (ref 80.0–100.0)
MPV: 9.3 fL (ref 7.5–12.5)
PLATELETS: 444 10*3/uL — AB (ref 140–400)
RBC: 5.04 10*6/uL (ref 3.80–5.10)
RDW: 16 % — AB (ref 11.0–15.0)
WBC: 9.7 10*3/uL (ref 3.8–10.8)

## 2017-05-14 LAB — PROTIME-INR
INR: 1
PROTHROMBIN TIME: 10.2 s (ref 9.0–11.5)

## 2017-05-14 LAB — IRON,TIBC AND FERRITIN PANEL
%SAT: 6 % — ABNORMAL LOW (ref 11–50)
Ferritin: 7 ng/mL — ABNORMAL LOW (ref 10–232)
Iron: 26 ug/dL — ABNORMAL LOW (ref 45–160)
TIBC: 443 ug/dL (ref 250–450)

## 2017-05-16 ENCOUNTER — Ambulatory Visit (INDEPENDENT_AMBULATORY_CARE_PROVIDER_SITE_OTHER): Payer: Medicare PPO

## 2017-05-16 ENCOUNTER — Encounter: Payer: Self-pay | Admitting: Family Medicine

## 2017-05-16 ENCOUNTER — Ambulatory Visit (INDEPENDENT_AMBULATORY_CARE_PROVIDER_SITE_OTHER): Payer: Medicare PPO | Admitting: Family Medicine

## 2017-05-16 VITALS — BP 161/96 | HR 97 | Wt 163.0 lb

## 2017-05-16 DIAGNOSIS — M79662 Pain in left lower leg: Secondary | ICD-10-CM

## 2017-05-16 DIAGNOSIS — S8992XA Unspecified injury of left lower leg, initial encounter: Secondary | ICD-10-CM | POA: Diagnosis not present

## 2017-05-16 LAB — WOUND CULTURE
MICRO NUMBER: 81123714
RESULT: NO GROWTH
SPECIMEN QUALITY: ADEQUATE

## 2017-05-16 NOTE — Patient Instructions (Addendum)
Thank you for coming in today. Recheck in 2 week.  Use the ACE wrap as tolerated.

## 2017-05-16 NOTE — Progress Notes (Signed)
Sophia Smith is a 56 y.o. female who presents to [place]: [place] today for follow-up right leg seroma and discussed Left leg injury. Patient has a tubular car with heavy doors. She has managed accidentally have the dorsal and shut against her right leg in the past. She was seen in clinic on October 19 for this where they seroma was drained. She was doing pretty well and notes that her right leg seroma has improved considerably.  However 2 days ago the same thing happened with her left leg resulting in a large soft tender area on her left anterior shin. She denies any radiating pain weakness or numbness fevers or chills. She has tried treating with ice and rest which have helped a bit.   Past Medical History:  Diagnosis Date  . Back disorder   . Fibromyalgia   . Knee gives out   . Other disorders of continuity of bone, right pelvic region and thigh   . Stroke [facility])    Past Surgical History:  Procedure Laterality Date  . CHOLECYSTECTOMY    . LEG SURGERY Bilateral    numerous leg surgeries post accident age 20  . SIGMOID RESECTION / RECTOPEXY     Social History  Substance Use Topics  . Smoking status: Former Smoker    Packs/day: 0.50    Types: Cigarettes    Quit date: 08/23/2013  . Smokeless tobacco: Never Used  . Alcohol use No   family history includes Cancer in her father; Heart attack in her father.  ROS as above:  Medications: Current Outpatient Prescriptions  Medication Sig Dispense Refill  . Albuterol Sulfate (PROAIR RESPICLICK) 269 (90 BASE) MCG/ACT AEPB Inhale 2 puffs into the lungs every 4 (four) hours as needed. 1 each 0  . cephALEXin (KEFLEX) 500 MG capsule Take 1 capsule (500 mg total) by mouth 3 (three) times daily. 21 capsule 0  . citalopram (CELEXA) 40 MG tablet Take 40 mg by mouth daily.    . clindamycin (CLEOCIN) 300 MG capsule Take 1  capsule (300 mg total) by mouth 4 (four) times daily. X 7 days 28 capsule 0  . cyclobenzaprine (FLEXERIL) 10 MG tablet Take 1 tablet (10 mg total) by mouth 3 (three) times daily as needed for muscle spasms. 30 tablet 0  . diclofenac sodium (VOLTAREN) 1 % GEL Apply 4 g topically 4 (four) times daily. 1 Tube 3  . furosemide (LASIX) 40 MG tablet Take 1 tablet (40 mg total) by mouth 2 (two) times daily. 30 tablet 0  . gabapentin (NEURONTIN) 800 MG tablet Take 800 mg by mouth 3 (three) times daily.    Marland Kitchen LORazepam (ATIVAN) 0.5 MG tablet Take 1 tablet (0.5 mg total) by mouth 2 (two) times daily as needed for anxiety. 60 tablet 1  . orphenadrine (NORFLEX) 100 MG tablet Take 1 tablet (100 mg total) by mouth 2 (two) times daily. 60 tablet 1  . OxyCODONE ER (XTAMPZA ER) 18 MG C12A take 1 capsule by mouth twice a day with food    . oxyCODONE-acetaminophen (PERCOCET) 10-325 MG tablet 1 tablet 4 (four) times daily. Pt takes 3 x a day and 1 extra  time when pain is the worst    . zolpidem (AMBIEN) 5 MG tablet Take 1 tablet (5 mg total) by mouth at bedtime as needed for sleep. 30 tablet 1   No current facility-administered medications for this visit.    Allergies  Allergen  Reactions  . Morphine And Related   . Nsaids     Aleve,ibuprofen all seems to caused sharp pains in abdominal and discomfort.     Health Maintenance Health Maintenance  Topic Date Due  . Hepatitis C Screening  Mar 13, 1961  . HIV Screening  10/28/1976  . PAP SMEAR  10/29/1982  . MAMMOGRAM  10/29/2011  . COLONOSCOPY  11/30/2020  . TETANUS/TDAP  02/22/2024  . INFLUENZA VACCINE  Completed     Exam:  BP (!) 161/96   Pulse 97   Wt 163 lb (73.9 kg)   BMI 24.07 kg/m  Gen: Well NAD HEENT: EOMI,  MMM Lungs: Normal work of breathing. CTABL Heart: RRR no MRG Abd: NABS, Soft. Nondistended, Nontender Exts: Brisk capillary refill, warm and well perfused.  Right leg well-appearing incision of seroma. The skin is nontender patient has  normal foot motion. Left leg large fluctuant area left anterior shin consistent with contusion/seroma. Foot motion pulses capillary refill and sensation are intact.  X-ray left tib-fib shows no apparent fracture. Formal radiology review pending   Results for orders placed or performed in visit on 05/13/17 (from the past 72 hour(s))  Wound culture     Status: None   Collection Time: 05/13/17  3:27 PM  Result Value Ref Range   MICRO NUMBER: [id number]    SPECIMEN QUALITY: ADEQUATE    SOURCE: RIGHT LEG SEROMA    STATUS: FINAL    GRAM STAIN:      Few White blood cells seen No epithelial cells seen No organisms seen   RESULT: No Growth   CBC     Status: Abnormal   Collection Time: 05/13/17  3:40 PM  Result Value Ref Range   WBC 9.7 3.8 - 10.8 Thousand/uL   RBC 5.04 3.80 - 5.10 Million/uL   Hemoglobin 11.7 11.7 - 15.5 g/dL   HCT 37.5 35.0 - 45.0 %   MCV 74.4 (L) 80.0 - 100.0 fL   MCH 23.2 (L) 27.0 - 33.0 pg   MCHC 31.2 (L) 32.0 - 36.0 g/dL   RDW 16.0 (H) 11.0 - 15.0 %   Platelets 444 (H) 140 - 400 Thousand/uL   MPV 9.3 7.5 - 12.5 fL  COMPLETE METABOLIC PANEL WITH GFR     Status: None   Collection Time: 05/13/17  3:40 PM  Result Value Ref Range   Glucose, Bld 99 65 - 99 mg/dL    Comment: .            Fasting reference interval .    BUN 12 7 - 25 mg/dL   Creat 0.64 0.50 - 1.05 mg/dL    Comment: For patients >48 years of age, the reference limit for Creatinine is approximately 13% higher for people identified as African-American. .    GFR, Est Non African American 100 > OR = 60 mL/min/1.52m2   GFR, Est African American 116 > OR = 60 mL/min/1.37m2   BUN/Creatinine Ratio NOT APPLICABLE 6 - 22 (calc)   Sodium 138 135 - 146 mmol/L   Potassium 4.1 3.5 - 5.3 mmol/L   Chloride 102 98 - 110 mmol/L   CO2 27 20 - 32 mmol/L   Calcium 9.1 8.6 - 10.4 mg/dL   Total Protein 6.5 6.1 - 8.1 g/dL   Albumin 4.0 3.6 - 5.1 g/dL   Globulin 2.5 1.9 - 3.7 g/dL (calc)   AG Ratio 1.6 1.0 - 2.5  (calc)   Total Bilirubin 0.3 0.2 - 1.2 mg/dL   Alkaline phosphatase (APISO)  102 33 - 130 U/L   AST 16 10 - 35 U/L   ALT 10 6 - 29 U/L  Fe+TIBC+Fer     Status: Abnormal   Collection Time: 05/13/17  3:40 PM  Result Value Ref Range   Iron 26 (L) 45 - 160 mcg/dL   TIBC 443 250 - 450 mcg/dL (calc)   %SAT 6 (L) 11 - 50 % (calc)   Ferritin 7 (L) 10 - 232 ng/mL  INR/PT     Status: None   Collection Time: 05/13/17  3:40 PM  Result Value Ref Range   INR 1.0     Comment: Reference Range                     0.9-1.1 Moderate-intensity Warfarin Therapy 2.0-3.0 Higher-intensity Warfarin Therapy   3.0-4.0  .    Prothrombin Time 10.2 9.0 - 11.5 sec    Comment: . For more information on this test, go to: http://education.quest[facility].com/faq/FAQ104 .    No results found.    Assessment and Plan: 56 y.o. female with Resolving right leg seroma status post incision and drainage. Workup so far has been unremarkable. Plan for continued conservative management and watchful waiting.  New left leg seroma. This just happened and I think is a bit premature to drain it. Plan for treatment with compression and ice. Recommend home exercise program and recheck in 2 weeks. Return sooner if needed.   Orders Placed This Encounter  Procedures  . DG Tibia/Fibula Left    Standing Status:   Future    Number of Occurrences:   1    Standing Expiration Date:   07/16/2018    Order Specific Question:   Reason for Exam (SYMPTOM  OR DIAGNOSIS REQUIRED)    Answer:   eval left TIb/Fib injury. Hit by car door 2 days ago    Order Specific Question:   Is patient pregnant?    Answer:   No    Order Specific Question:   Preferred imaging location?    Answer:   [facility]    Order Specific Question:   Radiology Contrast Protocol - do NOT remove file path    Answer:   [email or web address]   No orders of the defined types were placed in this  encounter.    Discussed warning signs or symptoms. Please see discharge instructions. Patient expresses understanding.

## 2017-05-20 ENCOUNTER — Ambulatory Visit: Payer: Medicare PPO | Admitting: Family Medicine

## 2017-05-30 ENCOUNTER — Ambulatory Visit (INDEPENDENT_AMBULATORY_CARE_PROVIDER_SITE_OTHER): Payer: Medicare PPO | Admitting: Family Medicine

## 2017-05-30 VITALS — BP 149/88 | HR 94

## 2017-05-30 DIAGNOSIS — T792XXA Traumatic secondary and recurrent hemorrhage and seroma, initial encounter: Secondary | ICD-10-CM

## 2017-05-30 NOTE — Patient Instructions (Addendum)
Thank you for coming in today. Start keflex Recheck in 2 weeks.  Return sooner if needed.  This will drain slowly  We will want you to recheck iron studies in 2 months.    Seroma A seroma is a collection of fluid on the body that looks like swelling or a mass. Seromas form where tissue has been injured or cut. Seromas vary in size. Some are small and painless. Others may become large and cause pain or discomfort. Many seromas go away on their own as the fluid is naturally absorbed by the body, and some seromas need to be drained. What are the causes? Seromas form as the result of damage to tissue or the removal of tissue. This tissue damage may occur during surgery or because of an injury or trauma. When tissue is disrupted or removed, empty space is created. The body's natural defense system (immune system) causes fluid to enter the empty space and form a seroma. What are the signs or symptoms? Symptoms of this condition include:  Swelling at the site of a surgical cut (incision) or an injury.  Drainage of clear fluid at the surgery or injury site.  Discomfort or pain.  How is this diagnosed? This condition is diagnosed based on your symptoms, your medical history, and a physical exam. During the exam, your health care provider will press on the seroma. You may also have tests, including:  Blood tests.  Imaging tests, such as an ultrasound or CT scan.  How is this treated? Some seromas go away (resolve) on their own. Your health care provider may monitor you to make sure the seroma does not cause any complications. If your seroma does not resolve on its own, treatment may include:  Using a needle to drain the fluid from the seroma (needle aspiration).  Inserting a flexible tube (catheter) to drain the fluid.  Applying a bandage (dressing), such as an elastic bandage or binder.  Antibiotic medicines, if the seroma becomes infected.  In rare cases, surgery may be done to remove  the seroma and repair the area. Follow these instructions at home:  If you were prescribed an antibiotic medicine, take it as told by your health care provider. Do not stop taking the antibiotic even if you start to feel better.  Return to your normal activities as told by your health care provider. Ask your health care provider what activities are safe for you.  Take over-the-counter and prescription medicines only as told by your health care provider.  Check your seroma every day for signs of infection. Check for: ? Redness or pain. ? Fluid or pus. ? More swelling. ? Warmth.  Keep all follow-up visits as told by your health care provider. This is important. Contact a health care provider if:  You have a fever.  You have redness or pain at the site of the seroma.  You have fluid or pus coming from the seroma.  Your seroma is more swollen or is getting bigger.  Your seroma is warm to the touch. This information is not intended to replace advice given to you by your health care provider. Make sure you discuss any questions you have with your health care provider. Document Released: 11/16/2012 Document Revised: 05/03/2016 Document Reviewed: 05/03/2016 Elsevier Interactive Patient Education  [employer].

## 2017-05-30 NOTE — Progress Notes (Signed)
Sophia Smith is a 56 y.o. female who presents to [place]: [place] today for follow-up seroma.  Patient was seen for new left leg seroma October 12.  We elected for watchful waiting and the patient notes it has continued to be painful and erythematous.  She denies fevers or chills. She notes the right leg seroma that was drained October 9 is now asymptomatic nearly.   Past Medical History:  Diagnosis Date  . Back disorder   . Fibromyalgia   . Knee gives out   . Other disorders of continuity of bone, right pelvic region and thigh   . Stroke [facility])    Past Surgical History:  Procedure Laterality Date  . CHOLECYSTECTOMY    . LEG SURGERY Bilateral    numerous leg surgeries post accident age 50  . SIGMOID RESECTION / RECTOPEXY     Social History  Substance Use Topics  . Smoking status: Former Smoker    Packs/day: 0.50    Types: Cigarettes    Quit date: 08/23/2013  . Smokeless tobacco: Never Used  . Alcohol use No   family history includes Cancer in her father; Heart attack in her father.  ROS as above:  Medications: Current Outpatient Prescriptions  Medication Sig Dispense Refill  . citalopram (CELEXA) 40 MG tablet Take 40 mg by mouth daily.    . cyclobenzaprine (FLEXERIL) 10 MG tablet Take 1 tablet (10 mg total) by mouth 3 (three) times daily as needed for muscle spasms. 30 tablet 0  . diclofenac sodium (VOLTAREN) 1 % GEL Apply 4 g topically 4 (four) times daily. 1 Tube 3  . furosemide (LASIX) 40 MG tablet Take 1 tablet (40 mg total) by mouth 2 (two) times daily. 30 tablet 0  . gabapentin (NEURONTIN) 800 MG tablet Take 800 mg by mouth 3 (three) times daily.    . OxyCODONE ER (XTAMPZA ER) 18 MG C12A take 1 capsule by mouth twice a day with food    . oxyCODONE-acetaminophen (PERCOCET) 10-325 MG tablet 1 tablet 4 (four) times daily. Pt takes 3 x a day and 1 extra   time when pain is the worst     No current facility-administered medications for this visit.    Allergies  Allergen Reactions  . Morphine And Related   . Nsaids     Aleve,ibuprofen all seems to caused sharp pains in abdominal and discomfort.     Health Maintenance Health Maintenance  Topic Date Due  . Hepatitis C Screening  [birth year]  . HIV Screening  10/28/1976  . PAP SMEAR  10/29/1982  . MAMMOGRAM  10/29/2011  . COLONOSCOPY  11/30/2020  . TETANUS/TDAP  02/22/2024  . INFLUENZA VACCINE  Completed     Exam:  BP (!) 149/88   Pulse 94  Gen: Well NAD HEENT: EOMI,  MMM Lungs: Normal work of breathing. CTABL Heart: RRR no MRG Abd: NABS, Soft. Nondistended, Nontender Exts: Brisk capillary refill, warm and well perfused.  Left leg large fluctuant erythematous mass anterior shin.  Tender to touch.  Consistent with seroma.   Aspiration of seroma: Consent obtained and timeout performed Skin cleaned with chlorhexidine, cold spray applied, and 4 mL of Marcaine with epinephrine injected achieving good anesthesia. An 18-gauge needle was used to access the cystic structure. A small amount of bloody material was aspirated however I was unable to achieve a good vacuum within the syringe because air was penetrating through the skin and being  sucked into the needle/syringe. After discussion with the patient we elected to make a small incision to allow the seroma to drain more freely. A dressing was applied and secured with Ace wrap. Patient tolerated the procedure well.   Lab Results  Component Value Date   WBC 9.7 05/13/2017   HGB 11.7 05/13/2017   HCT 37.5 05/13/2017   MCV 74.4 (L) 05/13/2017   PLT 444 (H) 05/13/2017   Lab Results  Component Value Date   INR 1.0 05/13/2017     Chemistry      Component Value Date/Time   NA 138 05/13/2017 1540   K 4.1 05/13/2017 1540   CL 102 05/13/2017 1540   CO2 27 05/13/2017 1540   BUN 12 05/13/2017 1540   CREATININE 0.64 05/13/2017  1540      Component Value Date/Time   CALCIUM 9.1 05/13/2017 1540   ALKPHOS 101 04/20/2014 1059   AST 16 05/13/2017 1540   ALT 10 05/13/2017 1540   BILITOT 0.3 05/13/2017 1540       No results found for this or any previous visit (from the past 72 hour(s)). No results found.    Assessment and Plan: 56 y.o. female with posttraumatic seroma of the left leg.  Drained today.  This is very similar to the contralateral right leg seroma that was drained earlier this month. Plan to recheck in 2 weeks.  Return sooner if needed.   No orders of the defined types were placed in this encounter.  No orders of the defined types were placed in this encounter.    Discussed warning signs or symptoms. Please see discharge instructions. Patient expresses understanding.

## 2017-06-02 ENCOUNTER — Telehealth: Payer: Self-pay

## 2017-06-02 NOTE — Telephone Encounter (Signed)
Pt called and said that her leg is getting red from the injury site down to her ankle, with swelling in her ankle.  She is concerned about cellulitis.  She is asking if she should have a stronger antibiotic than the Keflex, or does she need to be seen?  Please advise.

## 2017-06-03 MED ORDER — DOXYCYCLINE HYCLATE 100 MG PO TABS: 100.0000 mg | ORAL_TABLET | Freq: Two times a day (BID) | ORAL | 0 refills | Status: DC

## 2017-06-03 NOTE — Telephone Encounter (Signed)
Add doxycycline to keflex.  Return to clinic if worsening or not improving.

## 2017-06-03 NOTE — Telephone Encounter (Signed)
Information discussed with pt. Pt verbalized understanding. 

## 2017-06-06 ENCOUNTER — Ambulatory Visit: Payer: Medicare PPO | Admitting: Rehabilitative and Restorative Service Providers"

## 2017-06-13 ENCOUNTER — Ambulatory Visit (INDEPENDENT_AMBULATORY_CARE_PROVIDER_SITE_OTHER): Payer: Medicare PPO | Admitting: Family Medicine

## 2017-06-13 ENCOUNTER — Encounter: Payer: Self-pay | Admitting: Family Medicine

## 2017-06-13 VITALS — BP 165/95 | HR 105 | Temp 98.5°F | Wt 160.0 lb

## 2017-06-13 DIAGNOSIS — S81802A Unspecified open wound, left lower leg, initial encounter: Secondary | ICD-10-CM | POA: Diagnosis not present

## 2017-06-13 MED ORDER — CEFDINIR 300 MG PO CAPS: 300.0000 mg | ORAL_CAPSULE | Freq: Two times a day (BID) | ORAL | 0 refills | Status: DC

## 2017-06-13 MED ORDER — DOXYCYCLINE HYCLATE 100 MG PO TABS: 100.0000 mg | ORAL_TABLET | Freq: Two times a day (BID) | ORAL | 0 refills | Status: DC

## 2017-06-13 NOTE — Patient Instructions (Signed)
Thank you for coming in today. Take doxycycline and omnicef twice daily.  You should hear from wound clinic.  Let me know if you do not hear anything.  Recheck with me as needed.

## 2017-06-13 NOTE — Progress Notes (Signed)
Sophia Smith is a 56 y.o. female who presents to [place]: [place] today for follow-up left leg seroma. Patient was seen several times in October for bilateral shin posttraumatic seroma and cellulitis. On October 26 she had incision and drainage of a posttraumatic seroma of her left leg complicated by presumptive cellulitis.  She notes that the symptoms initially improved but she's developed more redness and some pain in the distal anterior shin. She is worried that it has become infected again. She denies fevers or chills nausea vomiting or diarrhea.   Past Medical History:  Diagnosis Date  . Back disorder   . Fibromyalgia   . Knee gives out   . Other disorders of continuity of bone, right pelvic region and thigh   . Stroke [facility])    Past Surgical History:  Procedure Laterality Date  . CHOLECYSTECTOMY    . LEG SURGERY Bilateral    numerous leg surgeries post accident age 68  . SIGMOID RESECTION / RECTOPEXY     Social History   Tobacco Use  . Smoking status: Former Smoker    Packs/day: 0.50    Types: Cigarettes    Last attempt to quit: 08/23/2013    Years since quitting: 3.8  . Smokeless tobacco: Never Used  Substance Use Topics  . Alcohol use: No   family history includes Cancer in her father; Heart attack in her father.  ROS as above:  Medications: Current Outpatient Medications  Medication Sig Dispense Refill  . citalopram (CELEXA) 40 MG tablet Take 40 mg by mouth daily.    . cyclobenzaprine (FLEXERIL) 10 MG tablet Take 1 tablet (10 mg total) by mouth 3 (three) times daily as needed for muscle spasms. 30 tablet 0  . diclofenac sodium (VOLTAREN) 1 % GEL Apply 4 g topically 4 (four) times daily. 1 Tube 3  . furosemide (LASIX) 40 MG tablet Take 1 tablet (40 mg total) by mouth 2 (two) times daily. 30 tablet 0  . gabapentin (NEURONTIN) 800 MG tablet Take 800  mg by mouth 3 (three) times daily.    . OxyCODONE ER (XTAMPZA ER) 18 MG C12A take 1 capsule by mouth twice a day with food    . oxyCODONE-acetaminophen (PERCOCET) 10-325 MG tablet 1 tablet 4 (four) times daily. Pt takes 3 x a day and 1 extra  time when pain is the worst    . cefdinir (OMNICEF) 300 MG capsule Take 1 capsule (300 mg total) 2 (two) times daily by mouth. 28 capsule 0  . doxycycline (VIBRA-TABS) 100 MG tablet Take 1 tablet (100 mg total) 2 (two) times daily by mouth. 28 tablet 0   No current facility-administered medications for this visit.    Allergies  Allergen Reactions  . Morphine And Related   . Nsaids     Aleve,ibuprofen all seems to caused sharp pains in abdominal and discomfort.     Health Maintenance Health Maintenance  Topic Date Due  . Hepatitis C Screening  February 03, 1961  . HIV Screening  10/28/1976  . PAP SMEAR  10/29/1982  . MAMMOGRAM  10/29/2011  . COLONOSCOPY  11/30/2020  . TETANUS/TDAP  02/22/2024  . INFLUENZA VACCINE  Completed     Exam:  BP (!) 165/95   Pulse (!) 105   Temp 98.5 F (36.9 C) (Oral)   Wt 160 lb (72.6 kg)   SpO2 100%   BMI 23.63 kg/m  Gen: Well NAD HEENT: EOMI,  MMM  Lungs: Normal work of breathing. CTABL Heart: RRR no MRG Abd: NABS, Soft. Nondistended, Nontender Exts: Brisk capillary refill, warm and well perfused.  Skin left leg erythematous skin at the distal anterior shin. There is a healing wound at the mid anterior shin with black eschar. The wound has no fluctuance or expressible pus. It is mildly tender.   No results found for this or any previous visit (from the past 72 hour(s)). No results found.    Assessment and Plan: 56 y.o. female with cellulitis of the left leg and healing wound. I don't think there is any remaining seroma based on appearance. I think it's reasonable to proceed with a further round of antibiotic therapy for cellulitis. However I'm worried about her ability to heal the wound on her left leg.  She smokes and is in general poor health overall. Plan to refer to wound management for further help with this issue. Otherwise she should follow-up with her PCP. I'm available for help as needed.    Orders Placed This Encounter  Procedures  . Ambulatory referral to Wound Clinic    Referral Priority:   Routine    Referral Type:   Consultation    Referral Reason:   Specialty Services Required    Requested Specialty:   Wound Care    Number of Visits Requested:   1   Meds ordered this encounter  Medications  . doxycycline (VIBRA-TABS) 100 MG tablet    Sig: Take 1 tablet (100 mg total) 2 (two) times daily by mouth.    Dispense:  28 tablet    Refill:  0  . cefdinir (OMNICEF) 300 MG capsule    Sig: Take 1 capsule (300 mg total) 2 (two) times daily by mouth.    Dispense:  28 capsule    Refill:  0     Discussed warning signs or symptoms. Please see discharge instructions. Patient expresses understanding.

## 2017-07-25 ENCOUNTER — Ambulatory Visit: Payer: Medicare PPO | Admitting: Physician Assistant

## 2017-08-01 ENCOUNTER — Ambulatory Visit: Payer: Medicare PPO | Admitting: Physician Assistant

## 2017-08-04 ENCOUNTER — Encounter: Payer: Self-pay | Admitting: Physician Assistant

## 2017-08-04 ENCOUNTER — Ambulatory Visit: Payer: Medicare PPO | Admitting: Physician Assistant

## 2017-08-04 VITALS — BP 148/82 | HR 96 | Ht 69.0 in | Wt 165.0 lb

## 2017-08-04 DIAGNOSIS — M797 Fibromyalgia: Secondary | ICD-10-CM | POA: Diagnosis not present

## 2017-08-04 DIAGNOSIS — Z131 Encounter for screening for diabetes mellitus: Secondary | ICD-10-CM

## 2017-08-04 DIAGNOSIS — R600 Localized edema: Secondary | ICD-10-CM

## 2017-08-04 DIAGNOSIS — D509 Iron deficiency anemia, unspecified: Secondary | ICD-10-CM

## 2017-08-04 DIAGNOSIS — L03116 Cellulitis of left lower limb: Secondary | ICD-10-CM

## 2017-08-04 DIAGNOSIS — F3342 Major depressive disorder, recurrent, in full remission: Secondary | ICD-10-CM

## 2017-08-04 DIAGNOSIS — S81802D Unspecified open wound, left lower leg, subsequent encounter: Secondary | ICD-10-CM

## 2017-08-04 DIAGNOSIS — Z13220 Encounter for screening for lipoid disorders: Secondary | ICD-10-CM

## 2017-08-04 DIAGNOSIS — S81802A Unspecified open wound, left lower leg, initial encounter: Secondary | ICD-10-CM | POA: Insufficient documentation

## 2017-08-04 HISTORY — DX: Cellulitis of left lower limb: L03.116

## 2017-08-04 MED ORDER — FUROSEMIDE 40 MG PO TABS: 40.0000 mg | ORAL_TABLET | Freq: Every day | ORAL | 1 refills | Status: DC

## 2017-08-04 MED ORDER — CYCLOBENZAPRINE HCL 10 MG PO TABS: 10.0000 mg | ORAL_TABLET | Freq: Every day | ORAL | 5 refills | Status: DC

## 2017-08-04 MED ORDER — CEFTRIAXONE SODIUM 1 G IJ SOLR
1.0000 g | Freq: Once | INTRAMUSCULAR | Status: AC
  Administered 2017-08-04: 1 g via INTRAMUSCULAR

## 2017-08-04 MED ORDER — DULOXETINE HCL 30 MG PO CPEP: ORAL_CAPSULE | ORAL | 1 refills | Status: DC

## 2017-08-04 NOTE — Progress Notes (Signed)
Subjective:    Patient ID: Sophia Smith, female    DOB: 02-Sep-1960, 56 y.o.   MRN: [id number]  HPI  Pt is a 56 yo pleasant female with chronic pain, fibromyalgia, recurrent cellulitis of left leg who presents to the clinic for medication follow up and to recheck left leg wound.   Left leg wound has been present since September when a car door hit her leg. She has been seen multiple times and had many rounds of abx the last being 11/9. She is concerned today because the redness has worsened and more swelling is present. No fever, chills, body aches.   Overall she feels like her depression is well controlled.   She sees pain clinic but overall her fibromyagia she continues to have good and bad days.   .. Active Ambulatory Problems    Diagnosis Date Noted  . Lymphedema of leg 09/13/2013  . Pneumothorax on left 09/13/2013  . Left rib fracture 09/13/2013  . Fibromyalgia 09/13/2013  . Lumbar disc disease 09/13/2013  . Aneurysm artery, neck ([place]) 09/15/2013  . Pelvic fracture ([place]) 09/15/2013  . Depression 09/15/2013  . Elevated alkaline phosphatase level 10/15/2013  . Bilateral lower extremity edema 10/26/2013  . Left knee pain 10/26/2013  . Tricompartment degenerative joint disease of knee 10/26/2013  . Plantar fasciitis, bilateral 03/21/2014  . S/P total knee replacement 09/13/2014  . Left ankle pain 09/13/2014  . Insomnia 06/01/2015  . Acute stress reaction 06/01/2015  . Upper back pain on right side 06/01/2015  . Internal hemorrhoid 12/05/2015  . Seroma, post-traumatic ([place]) 05/13/2017  . Iron deficiency anemia 05/13/2017  . Cellulitis of left lower leg 08/04/2017  . Wound of left leg 08/04/2017   Resolved Ambulatory Problems    Diagnosis Date Noted  . No Resolved Ambulatory Problems   Past Medical History:  Diagnosis Date  . Back disorder   . Fibromyalgia   . Knee gives out   . Other disorders of continuity of bone, right pelvic region and thigh   . Stroke [facility])         Review of Systems    see HPI.  Objective:   Physical Exam  Constitutional: She is oriented to person, place, and time. She appears well-developed and well-nourished.  HENT:  Head: Normocephalic and atraumatic.  Cardiovascular: Normal rate, regular rhythm and normal heart sounds.  Pulmonary/Chest: Effort normal and breath sounds normal.  Neurological: She is alert and oriented to person, place, and time.  Skin:  Left leg erythema anteriorly up to the well healed wound. 2+ edema. Slightly warm to the touch.   Psychiatric: She has a normal mood and affect. Her behavior is normal.          Assessment & Plan:  Marland KitchenMarland KitchenDiagnoses and all orders for this visit:  Wound of left lower extremity, subsequent encounter -     cefTRIAXone (ROCEPHIN) injection 1 g  Bilateral lower extremity edema -     furosemide (LASIX) 40 MG tablet; Take 1 tablet (40 mg total) by mouth daily. As needed for lower extremity edema. -     COMPLETE METABOLIC PANEL WITH GFR  Recurrent major depressive disorder, in full remission (HCC) -     DULoxetine (CYMBALTA) 30 MG capsule; Take one tablet for 2 weeks then increase to 2 tablets daily.  Fibromyalgia -     cyclobenzaprine (FLEXERIL) 10 MG tablet; Take 1 tablet (10 mg total) by mouth at bedtime. -     DULoxetine (CYMBALTA) 30 MG capsule;  Take one tablet for 2 weeks then increase to 2 tablets daily.  Iron deficiency anemia, unspecified iron deficiency anemia type -     Fe+TIBC+Fer -     CBC  Screening for diabetes mellitus -     COMPLETE METABOLIC PANEL WITH GFR  Screening for lipid disorders -     Lipid Panel w/reflex Direct LDL  Cellulitis of left lower leg -     cefTRIAXone (ROCEPHIN) injection 1 g  labs ordered.   .. Depression screen [facility] 2/9 08/10/2017 06/13/2017  Decreased Interest 0 1  Down, Depressed, Hopeless 1 1  PHQ - 2 Score 1 2  Altered sleeping 0 -  Tired, decreased energy 1 -  Change in appetite 0 -  Feeling bad or failure  about yourself  1 -  Trouble concentrating 0 -  Moving slowly or fidgety/restless 0 -  Suicidal thoughts 0 -  PHQ-9 Score 3 -  Difficult doing work/chores Not difficult at all -      Pt has had numerous abx for leg infection. It looks much improved today;however, pt reports changes over the last 2 days. Will give one shot of rocephin in clinic today. Continue to keep elevated. Use lasix as needed. Call with any worsening of swelling, redness, tenderness.

## 2017-08-05 LAB — COMPLETE METABOLIC PANEL WITH GFR
AG Ratio: 1.7 (calc) (ref 1.0–2.5)
ALBUMIN MSPROF: 3.7 g/dL (ref 3.6–5.1)
ALKALINE PHOSPHATASE (APISO): 123 U/L (ref 33–130)
ALT: 10 U/L (ref 6–29)
AST: 15 U/L (ref 10–35)
BUN: 9 mg/dL (ref 7–25)
CALCIUM: 8.8 mg/dL (ref 8.6–10.4)
CO2: 27 mmol/L (ref 20–32)
CREATININE: 0.6 mg/dL (ref 0.50–1.05)
Chloride: 107 mmol/L (ref 98–110)
GFR, EST NON AFRICAN AMERICAN: 103 mL/min/{1.73_m2} (ref >=60)
GFR, Est African American: 119 mL/min/{1.73_m2} (ref >=60)
GLOBULIN: 2.2 g/dL (ref 1.9–3.7)
GLUCOSE: 101 mg/dL — AB (ref 65–99)
Potassium: 3.9 mmol/L (ref 3.5–5.3)
SODIUM: 141 mmol/L (ref 135–146)
Total Bilirubin: 0.4 mg/dL (ref 0.2–1.2)
Total Protein: 5.9 g/dL — ABNORMAL LOW (ref 6.1–8.1)

## 2017-08-05 LAB — CBC
HCT: 33.6 % — ABNORMAL LOW (ref 35.0–45.0)
Hemoglobin: 10.5 g/dL — ABNORMAL LOW (ref 11.7–15.5)
MCH: 23.5 pg — AB (ref 27.0–33.0)
MCHC: 31.3 g/dL — AB (ref 32.0–36.0)
MCV: 75.3 fL — AB (ref 80.0–100.0)
MPV: 9.5 fL (ref 7.5–12.5)
PLATELETS: 391 10*3/uL (ref 140–400)
RBC: 4.46 10*6/uL (ref 3.80–5.10)
RDW: 16.8 % — ABNORMAL HIGH (ref 11.0–15.0)
WBC: 8.1 10*3/uL (ref 3.8–10.8)

## 2017-08-05 LAB — LIPID PANEL W/REFLEX DIRECT LDL
CHOLESTEROL: 143 mg/dL (ref <200)
HDL: 55 mg/dL (ref >50)
LDL CHOLESTEROL (CALC): 74 mg/dL
Non-HDL Cholesterol (Calc): 88 mg/dL (calc) (ref <130)
Total CHOL/HDL Ratio: 2.6 (calc) (ref <5.0)
Triglycerides: 56 mg/dL (ref <150)

## 2017-08-05 LAB — IRON,TIBC AND FERRITIN PANEL
%SAT: 5 % — AB (ref 11–50)
FERRITIN: 7 ng/mL — AB (ref 10–232)
IRON: 19 ug/dL — AB (ref 45–160)
TIBC: 413 ug/dL (ref 250–450)

## 2017-08-06 ENCOUNTER — Encounter: Payer: Self-pay | Admitting: Physician Assistant

## 2017-08-21 ENCOUNTER — Telehealth: Payer: Self-pay | Admitting: *Deleted

## 2017-08-21 DIAGNOSIS — D509 Iron deficiency anemia, unspecified: Secondary | ICD-10-CM

## 2017-08-21 NOTE — Telephone Encounter (Signed)
Labs ordered to recheck iron levels.

## 2017-08-23 LAB — IRON,TIBC AND FERRITIN PANEL
%SAT: 5 % (calc) — ABNORMAL LOW (ref 11–50)
Ferritin: 8 ng/mL — ABNORMAL LOW (ref 10–232)
IRON: 22 ug/dL — AB (ref 45–160)
TIBC: 415 mcg/dL (calc) (ref 250–450)

## 2017-08-25 ENCOUNTER — Other Ambulatory Visit: Payer: Self-pay | Admitting: Physician Assistant

## 2017-08-25 DIAGNOSIS — D509 Iron deficiency anemia, unspecified: Secondary | ICD-10-CM

## 2017-09-15 DIAGNOSIS — M47816 Spondylosis without myelopathy or radiculopathy, lumbar region: Secondary | ICD-10-CM | POA: Diagnosis not present

## 2017-09-15 DIAGNOSIS — Z79899 Other long term (current) drug therapy: Secondary | ICD-10-CM | POA: Diagnosis not present

## 2017-09-15 DIAGNOSIS — M47817 Spondylosis without myelopathy or radiculopathy, lumbosacral region: Secondary | ICD-10-CM | POA: Diagnosis not present

## 2017-09-15 DIAGNOSIS — Z79891 Long term (current) use of opiate analgesic: Secondary | ICD-10-CM | POA: Diagnosis not present

## 2017-09-15 DIAGNOSIS — G894 Chronic pain syndrome: Secondary | ICD-10-CM | POA: Diagnosis not present

## 2017-09-15 DIAGNOSIS — M549 Dorsalgia, unspecified: Secondary | ICD-10-CM | POA: Diagnosis not present

## 2017-09-15 DIAGNOSIS — M797 Fibromyalgia: Secondary | ICD-10-CM | POA: Diagnosis not present

## 2017-09-19 ENCOUNTER — Ambulatory Visit (INDEPENDENT_AMBULATORY_CARE_PROVIDER_SITE_OTHER): Payer: Medicare HMO | Admitting: Physician Assistant

## 2017-09-19 ENCOUNTER — Encounter: Payer: Self-pay | Admitting: Physician Assistant

## 2017-09-19 VITALS — BP 173/125 | HR 105 | Temp 99.1°F | Resp 18 | Wt 162.5 lb

## 2017-09-19 DIAGNOSIS — Z765 Malingerer [conscious simulation]: Secondary | ICD-10-CM

## 2017-09-19 DIAGNOSIS — F1123 Opioid dependence with withdrawal: Secondary | ICD-10-CM

## 2017-09-19 DIAGNOSIS — M545 Low back pain, unspecified: Secondary | ICD-10-CM

## 2017-09-19 DIAGNOSIS — M5442 Lumbago with sciatica, left side: Secondary | ICD-10-CM

## 2017-09-19 DIAGNOSIS — M797 Fibromyalgia: Secondary | ICD-10-CM | POA: Diagnosis not present

## 2017-09-19 DIAGNOSIS — I10 Essential (primary) hypertension: Secondary | ICD-10-CM | POA: Insufficient documentation

## 2017-09-19 DIAGNOSIS — R69 Illness, unspecified: Secondary | ICD-10-CM | POA: Diagnosis not present

## 2017-09-19 DIAGNOSIS — G8929 Other chronic pain: Secondary | ICD-10-CM | POA: Insufficient documentation

## 2017-09-19 DIAGNOSIS — F17210 Nicotine dependence, cigarettes, uncomplicated: Secondary | ICD-10-CM | POA: Insufficient documentation

## 2017-09-19 DIAGNOSIS — G4701 Insomnia due to medical condition: Secondary | ICD-10-CM | POA: Diagnosis not present

## 2017-09-19 DIAGNOSIS — M5441 Lumbago with sciatica, right side: Secondary | ICD-10-CM

## 2017-09-19 MED ORDER — CLONIDINE HCL 0.1 MG PO TABS: 0.1000 mg | ORAL_TABLET | Freq: Two times a day (BID) | ORAL | 0 refills | Status: DC | PRN

## 2017-09-19 MED ORDER — OXYCODONE-ACETAMINOPHEN 10-325 MG PO TABS: ORAL_TABLET | ORAL | 0 refills | Status: DC

## 2017-09-19 MED ORDER — PREDNISONE 50 MG PO TABS: 50.0000 mg | ORAL_TABLET | Freq: Every day | ORAL | 0 refills | Status: DC

## 2017-09-19 MED ORDER — TRAZODONE HCL 50 MG PO TABS: 50.0000 mg | ORAL_TABLET | Freq: Every evening | ORAL | 0 refills | Status: DC | PRN

## 2017-09-19 MED ORDER — LOSARTAN POTASSIUM 50 MG PO TABS: 50.0000 mg | ORAL_TABLET | Freq: Every day | ORAL | 3 refills | Status: DC

## 2017-09-19 NOTE — Progress Notes (Signed)
HPI:                                                                Sophia Smith is a 57 y.o. female who presents to [place]: [place] today for back pain  This is a 57 yo F with chronic LBP, lumbar spondylosis, fibromyalgia, insomnia, IDA, and HTN who presents today requesting narcotic pain medication for her back pain. She is followed by [facility] for her chronic pain management. Per the last office visit we have on file, she is treated with Xtampza ER 18 mg bid and Percocet 10-325 mg tid prn (total MME 99 daily). Adjunctive treatments include steroid injection of right trochanteric bursa, lumbar radiofrequency, and PT.  Per patient, she tested positive for morphine in her last UDS and her opioids were discontinued for violating her pain contract. She states she is allergic to morphine and that she did not violate her contract. She has decided that she no longer wants to receive care from [facility]. She also took the last of her Xtampza yesterday morning because she has been using it for sleep and is not taking her prescriptions as prescribed.   Our office contacted her pain management office and they are closed today, so we are unable to confirm any of this information. Her last fill date of Xtampza ER was 08/25/17 #60 and Percocet 10-325 was 08/18/17 #90.  She complains of mostly right-sided severe low-back pain radiating to the right buttock. She attributes this to recent steroid injection. Denies injury/trauma. Denies constitutional symptoms, bowel/bladder dysfunction, perianal numbness. Symptoms are consistent with her chronic low back pain.   Depression screen [facility] 2/9 08/10/2017 06/13/2017  Decreased Interest 0 1  Down, Depressed, Hopeless 1 1  PHQ - 2 Score 1 2  Altered sleeping 0 -  Tired, decreased energy 1 -  Change in appetite 0 -  Feeling bad or failure about yourself  1 -  Trouble concentrating 0 -   Moving slowly or fidgety/restless 0 -  Suicidal thoughts 0 -  PHQ-9 Score 3 -  Difficult doing work/chores Not difficult at all -    No flowsheet data found.    Past Medical History:  Diagnosis Date  . Back disorder   . Cellulitis of left lower leg 08/04/2017  . Fibromyalgia   . Knee gives out   . Other disorders of continuity of bone, right pelvic region and thigh   . Stroke [facility])    Past Surgical History:  Procedure Laterality Date  . CHOLECYSTECTOMY    . LEG SURGERY Bilateral    numerous leg surgeries post accident age 78  . SIGMOID RESECTION / RECTOPEXY     Social History   Tobacco Use  . Smoking status: Current Every Day Smoker    Packs/day: 0.50    Types: Cigarettes    Last attempt to quit: 08/23/2013    Years since quitting: 4.0  . Smokeless tobacco: Never Used  Substance Use Topics  . Alcohol use: No   family history includes Cancer in her father; Heart attack in her father.    ROS: negative except as noted in the HPI  Medications: Current Outpatient Medications  Medication Sig Dispense Refill  .  cyclobenzaprine (FLEXERIL) 10 MG tablet Take 1 tablet (10 mg total) by mouth at bedtime. 30 tablet 5  . diclofenac sodium (VOLTAREN) 1 % GEL Apply 4 g topically 4 (four) times daily. 1 Tube 3  . DULoxetine (CYMBALTA) 30 MG capsule Take one tablet for 2 weeks then increase to 2 tablets daily. 60 capsule 1  . furosemide (LASIX) 40 MG tablet Take 1 tablet (40 mg total) by mouth daily. As needed for lower extremity edema. 30 tablet 1  . gabapentin (NEURONTIN) 800 MG tablet Take 800 mg by mouth 3 (three) times daily.    . OxyCODONE ER (XTAMPZA ER) 18 MG C12A take 1 capsule by mouth twice a day with food    . cloNIDine (CATAPRES) 0.1 MG tablet Take 1 tablet (0.1 mg total) by mouth 2 (two) times daily as needed (opioid withdrawal symptoms). 10 tablet 0  . losartan (COZAAR) 50 MG tablet Take 1 tablet (50 mg total) by mouth daily. 30 tablet 3  . oxyCODONE-acetaminophen  (PERCOCET) 10-325 MG tablet Take 1 tablet by mouth every 3 (three) hours for 2 days, THEN 1 tablet every 4 (four) hours for 2 days, THEN 1 tablet every 6 (six) hours for 2 days. 24 tablet 0  . predniSONE (DELTASONE) 50 MG tablet Take 1 tablet (50 mg total) by mouth daily. 5 tablet 0  . traZODone (DESYREL) 50 MG tablet Take 1-2 tablets (50-100 mg total) by mouth at bedtime as needed for sleep. 30 tablet 0   No current facility-administered medications for this visit.    Allergies  Allergen Reactions  . Morphine And Related   . Nsaids     Aleve,ibuprofen all seems to caused sharp pains in abdominal and discomfort.        Objective:  BP (!) 173/125 (BP Location: Right Arm, Cuff Size: Large)   Pulse (!) 105   Temp 99.1 F (37.3 C) (Oral)   Resp 18   Wt 162 lb 8 oz (73.7 kg)   SpO2 100%   BMI 24.00 kg/m  Gen:  alert, not ill-appearing, no distress, appropriate for age 51: head normocephalic without obvious abnormality, conjunctiva and cornea clear, trachea midline Pulm: Normal work of breathing, normal phonation, clear to auscultation bilaterally, no wheezes, rales or rhonchi CV: mildly tachycardic, regular rhythm, s1 and s2 distinct, no murmurs, clicks or rubs  Neuro: alert and oriented x 3, no tremor MSK: 1.5 cm ecchymoses of the right lumbar region at the injection sight, tenderness of lumbosacral region worse on the right, strength 5/5 symmetric in bilateral lower extremities, extremities atraumatic, antalgic gait and station Skin: intact, no rashes on exposed skin, no jaundice, no cyanosis Psych: appearance casual, cooperative, tearful    No results found for this or any previous visit (from the past 72 hour(s)). No results found.    Assessment and Plan: 57 y.o. female with   1. Opioid dependence with withdrawal ([place]) - checked [facility], she is not due for a refill of Xtampza. She admits to taking more than what is prescribed. - unable to obtain most recent records  from pain management today - patient is hypertensive, mildly tachycardic and low-grade temperature, consistent with acute withdrawal - patient understands that she will definitely be in violation of her pain contract if she accepts a prescription for opioids from our office today. She is requesting a referral to a new pain management clinic - patient understands that I will not be taking over her current prescriptions and that I do not prescribe  extended release opioids - reviewed treatment plan with patient. She will be placed on opioid taper #24 with clonidine up to 4 times daily prn for withdrawal and trazodone prn for sleep disturbance. She understands she is not to use additional pain medication for sleep and that no early refill will be provided - oxyCODONE-acetaminophen (PERCOCET) 10-325 MG tablet; Take 1 tablet by mouth every 3 (three) hours for 2 days, THEN 1 tablet every 4 (four) hours for 2 days, THEN 1 tablet every 6 (six) hours for 2 days.  Dispense: 24 tablet; Refill: 0 - cloNIDine (CATAPRES) 0.1 MG tablet; Take 1 tablet (0.1 mg total) by mouth 2 (two) times daily as needed (opioid withdrawal symptoms).  Dispense: 10 tablet; Refill: 0 - traZODone (DESYREL) 50 MG tablet; Take 1-2 tablets (50-100 mg total) by mouth at bedtime as needed for sleep.  Dispense: 30 tablet; Refill: 0  2. Acute exacerbation of chronic low back pain - predniSONE (DELTASONE) 50 MG tablet; Take 1 tablet (50 mg total) by mouth daily.  Dispense: 5 tablet; Refill: 0 - oxyCODONE-acetaminophen (PERCOCET) 10-325 MG tablet; Take 1 tablet by mouth every 3 (three) hours for 2 days, THEN 1 tablet every 4 (four) hours for 2 days, THEN 1 tablet every 6 (six) hours for 2 days.  Dispense: 24 tablet; Refill: 0  3. Drug-seeking behavior   4. Uncontrolled stage 2 hypertension BP Readings from Last 3 Encounters:  09/19/17 (!) 173/125  08/04/17 (!) 148/82  06/13/17 (!) 165/95  - denies chest pain, dyspnea, palpitations,  headache, lightheadedness/syncope - severe elevation consistent with withdrawal and pain, however it does appear she has underlying untreated hypertension based on review of prior office readings. Starting ARB in addition to short-term use of Clonidine - losartan (COZAAR) 50 MG tablet; Take 1 tablet (50 mg total) by mouth daily.  Dispense: 30 tablet; Refill: 3  5. Insomnia secondary to chronic pain - traZODone (DESYREL) 50 MG tablet; Take 1-2 tablets (50-100 mg total) by mouth at bedtime as needed for sleep.  Dispense: 30 tablet; Refill: 0  6. Fibromyalgia  Patient education and anticipatory guidance given Patient agrees with treatment plan Follow-up with PCP in 3-4 days or sooner as needed if symptoms worsen or fail to improve  Darlyne Russian PA-C

## 2017-09-19 NOTE — Patient Instructions (Signed)
Opioid Withdrawal Opioids are powerful substances that relieve pain. Opioids include illegal drugs, such as heroin, as well as prescription pain medicines, such as codeine, morphine, hydrocodone, oxycodone, and fentanyl. Opioid withdrawal is a group of symptoms that can happen if you have been taking opioids for a long time and suddenly stop. What are the causes? This condition is caused by taking opioids for weeks and then doing any of the following:  Stopping use.  Rapidly reducing use.  Taking a medicine to block their effect.  What increases the risk? This condition is more likely to develop in:  People who take opioids incorrectly.  People who take opioids for a long period of time.  What are the signs or symptoms? Symptoms of this condition can be physical or mental. Physical symptoms include:  Nausea and vomiting.  Muscle aches or spasms.  Watery eyes and runny nose.  Widening of the dark centers of the eyes (dilated pupils).  Hair standing on end.  Fever and sweating.  Intestinal cramping and diarrhea.  Increased blood pressure and fast pulse.  Mental symptoms include:  Depression.  Anxiety.  Restlessness and irritability.  Trouble sleeping.  When symptoms start and how long they last depends on if you have been taking an opioid that works fast and then loses its effect quickly (short acting-opioid), an opioid that works for a longer period of time (long-acting opioid), or a drug that blocks the effects of opioids.  If you have been taking a short-acting opioid, such as heroin and oxycodone, symptoms occur within hours of stopping or reducing the amount you take. The worst symptoms (peak withdrawal) occur in 24-48 hours. Symptoms should subside in 3-5 days.  If you have been taking a long-acting opioid, such as methadone, symptoms can occur within 30 hours of stopping or reducing the amount you take and can continue for up to 10 days.  If you are taking a  drug that blocks the effects of opioids, such as naltrexone or naloxone, symptoms begin within minutes.  How is this diagnosed? This condition is diagnosed based on:  Your symptoms.  Your medical history.  Your history of drug and alcohol use.  Which medicines you have been taking.  Your health care provider may:  Perform a physical exam.  Order tests.  Ask that you see a mental health professional.  How is this treated? Treatment for this condition is usually provided by mental health professionals with training in substance use disorders (addiction specialists). Treatment may involve:  Counseling. This treatment is also called talk therapy. It is provided by substance use treatment counselors.  Support groups. Support groups are run by people who have quit using opioids. They provide emotional support, advice, and guidance.  Medicine. Some medicines can help to lessen certain withdrawal symptoms. Sometimes an opioid is prescribed to replace the opioid that you have been taking. You may be asked to take less and less of this opioid over time to lessen or prevent withdrawal symptoms.  Follow these instructions at home:  Take over-the-counter and prescription medicines only as told by your health care provider.  Check with your health care provider before starting any new medicines.  Keep all follow-up visits as told by your health care provider. This is important. Contact a health care provider if:  You are not able to take your medicines as told.  Your symptoms get worse.  You take an opioid after stopping use, or you take more of an opioid than you have  been. Get help right away if:  You have a seizure.  You lose consciousness.  You have serious thoughts about hurting yourself or others. If you ever feel like you may hurt yourself or others, or have thoughts about taking your own life, get help right away. You can go to your nearest emergency department or  call:  Your local emergency services (911 in the [place]  A suicide crisis helpline, such as the [facility] at 980-054-8506. This is open 24 hours a day.  This information is not intended to replace advice given to you by your health care provider. Make sure you discuss any questions you have with your health care provider. Document Released: 07/25/2003 Document Revised: 05/03/2016 Document Reviewed: 05/03/2016 Elsevier Interactive Patient Education  [employer].

## 2017-09-22 ENCOUNTER — Encounter: Payer: Self-pay | Admitting: Physician Assistant

## 2017-09-22 ENCOUNTER — Ambulatory Visit (INDEPENDENT_AMBULATORY_CARE_PROVIDER_SITE_OTHER): Payer: Medicare HMO | Admitting: Physician Assistant

## 2017-09-22 VITALS — BP 145/84 | HR 91 | Ht 69.0 in | Wt 163.0 lb

## 2017-09-22 DIAGNOSIS — M545 Low back pain, unspecified: Secondary | ICD-10-CM

## 2017-09-22 DIAGNOSIS — G8929 Other chronic pain: Secondary | ICD-10-CM

## 2017-09-22 DIAGNOSIS — M7989 Other specified soft tissue disorders: Secondary | ICD-10-CM | POA: Diagnosis not present

## 2017-09-22 DIAGNOSIS — F1123 Opioid dependence with withdrawal: Secondary | ICD-10-CM

## 2017-09-22 DIAGNOSIS — R69 Illness, unspecified: Secondary | ICD-10-CM | POA: Diagnosis not present

## 2017-09-22 DIAGNOSIS — R011 Cardiac murmur, unspecified: Secondary | ICD-10-CM | POA: Diagnosis not present

## 2017-09-22 DIAGNOSIS — M4306 Spondylolysis, lumbar region: Secondary | ICD-10-CM

## 2017-09-22 DIAGNOSIS — M519 Unspecified thoracic, thoracolumbar and lumbosacral intervertebral disc disorder: Secondary | ICD-10-CM | POA: Diagnosis not present

## 2017-09-22 DIAGNOSIS — M2550 Pain in unspecified joint: Secondary | ICD-10-CM

## 2017-09-22 MED ORDER — OXYCODONE-ACETAMINOPHEN 10-325 MG PO TABS: ORAL_TABLET | ORAL | 0 refills | Status: AC

## 2017-09-22 NOTE — Progress Notes (Addendum)
Subjective:    Patient ID: Sophia Smith, female    DOB: 24-Jul-1961, 57 y.o.   MRN: [id number]  HPI  Pt is a 57 yo female who presents to the clinic to follow up on opoid withdrawls. She was seen in clinic on Friday with [facility]. She was given 24 tablets to start on a taper but not enough tablets to finish taper. She had been seen by Dr. Vira Blanco for 6 years and then told she tested positive for morphine at her last visit. She is completely unaware of how she tested positive for this. It is actually on her allergy list. Her last epidural injections were Monday last week. She is now completely out of extended release oxycodone and only has one day worth of immediate release oxycodone. She is just now at every 4 hours. She has not started taking clonadine yet. .  Today she feels like withdrawal symptoms are under control. She does worry about the next few days and request a few more oxycodone to completely taper off. She would also like referral to pain clinic.    .. Active Ambulatory Problems    Diagnosis Date Noted  . Lymphedema of leg 09/13/2013  . Pneumothorax on left 09/13/2013  . Left rib fracture 09/13/2013  . Fibromyalgia 09/13/2013  . Lumbar disc disease 09/13/2013  . Aneurysm artery, neck ([place]) 09/15/2013  . Pelvic fracture ([place]) 09/15/2013  . Depression 09/15/2013  . Elevated alkaline phosphatase level 10/15/2013  . Bilateral lower extremity edema 10/26/2013  . Left knee pain 10/26/2013  . Tricompartment degenerative joint disease of knee 10/26/2013  . Plantar fasciitis, bilateral 03/21/2014  . S/P total knee replacement 09/13/2014  . Left ankle pain 09/13/2014  . Insomnia secondary to chronic pain 06/01/2015  . Upper back pain on right side 06/01/2015  . Internal hemorrhoid 12/05/2015  . Seroma, post-traumatic ([place]) 05/13/2017  . Iron deficiency anemia 05/13/2017  . Wound of left leg 08/04/2017  . Drug-seeking behavior 09/19/2017  . Uncontrolled stage 2  hypertension 09/19/2017  . Opioid dependence with withdrawal ([place]) 09/19/2017  . Acute exacerbation of chronic low back pain 09/19/2017  . Cigarette nicotine dependence without complication 75/64/3329  . Lumbar spondylolysis 09/23/2017   Resolved Ambulatory Problems    Diagnosis Date Noted  . Acute stress reaction 06/01/2015  . Cellulitis of left lower leg 08/04/2017   Past Medical History:  Diagnosis Date  . Back disorder   . Cellulitis of left lower leg 08/04/2017  . Fibromyalgia   . Knee gives out   . Other disorders of continuity of bone, right pelvic region and thigh   . Stroke [facility])      Review of Systems See HPI.     Objective:   Physical Exam  Constitutional: She is oriented to person, place, and time. She appears well-developed and well-nourished.  Cardiovascular: Normal rate and regular rhythm.  Murmur heard. 2/6 SEM. No radiation to carotids.   Pulmonary/Chest: Effort normal and breath sounds normal. She has no wheezes.  Neurological: She is alert and oriented to person, place, and time.  Psychiatric: She has a normal mood and affect. Her behavior is normal.          Assessment & Plan:  Marland KitchenMarland KitchenAndreanna was seen today for hypertension.  Diagnoses and all orders for this visit:  Lumbar disc disease  Opioid dependence with withdrawal ([place]) -     Ambulatory referral to Pain Clinic -     oxyCODONE-acetaminophen (PERCOCET) 10-325 MG tablet; Take 1  tablet by mouth every 6 (six) hours as needed for 3 days for pain, THEN 1 tablet every 8 (eight) hours as needed for 3 days for pain, THEN 1 tablet every 12 (twelve) hours as needed for up to 3 days for pain.  Acute exacerbation of chronic low back pain -     oxyCODONE-acetaminophen (PERCOCET) 10-325 MG tablet; Take 1 tablet by mouth every 6 (six) hours as needed for 3 days for pain, THEN 1 tablet every 8 (eight) hours as needed for 3 days for pain, THEN 1 tablet every 12 (twelve) hours as needed for up to 3 days for  pain.  Lumbar spondylolysis -     Ambulatory referral to Pain Clinic  Polyarthralgia -     Ambulatory referral to Pain Clinic  Bilateral swelling of feet -     ECHOCARDIOGRAM COMPLETE; Future -     ECHOCARDIOGRAM COMPLETE  Systolic murmur -     ECHOCARDIOGRAM COMPLETE; Future -     ECHOCARDIOGRAM COMPLETE   I gave her enough tablets to finish off taper completely. She is aware this all the narcotics we will be giving her. Referral made to pain clinic. clonadine given to help with withdrawal symptoms during taper. Made patient aware there would be some withdrawal symptoms no matter how long the taper. Follow up when taper finished. Certainly we could discussed non-narcotic ways to treat pain while waiting on pain clinic to take over pain control.   No up to date echo. Will order.   Marland Kitchen.Spent 30 minutes with patient and greater than 50 percent of visit spent counseling patient regarding treatment plan.

## 2017-09-23 DIAGNOSIS — M47816 Spondylosis without myelopathy or radiculopathy, lumbar region: Secondary | ICD-10-CM | POA: Insufficient documentation

## 2017-09-23 DIAGNOSIS — M4306 Spondylolysis, lumbar region: Secondary | ICD-10-CM | POA: Insufficient documentation

## 2017-09-24 ENCOUNTER — Encounter: Payer: Self-pay | Admitting: Hematology & Oncology

## 2017-09-24 ENCOUNTER — Inpatient Hospital Stay: Payer: Medicare HMO

## 2017-09-24 ENCOUNTER — Other Ambulatory Visit: Payer: Self-pay

## 2017-09-24 ENCOUNTER — Telehealth: Payer: Self-pay | Admitting: Physician Assistant

## 2017-09-24 ENCOUNTER — Inpatient Hospital Stay: Payer: Medicare HMO | Attending: Hematology & Oncology | Admitting: Hematology & Oncology

## 2017-09-24 VITALS — BP 169/100 | HR 87 | Temp 98.7°F | Resp 18 | Wt 169.0 lb

## 2017-09-24 DIAGNOSIS — M549 Dorsalgia, unspecified: Secondary | ICD-10-CM | POA: Diagnosis not present

## 2017-09-24 DIAGNOSIS — R5381 Other malaise: Secondary | ICD-10-CM | POA: Diagnosis not present

## 2017-09-24 DIAGNOSIS — D500 Iron deficiency anemia secondary to blood loss (chronic): Secondary | ICD-10-CM

## 2017-09-24 DIAGNOSIS — Z87891 Personal history of nicotine dependence: Secondary | ICD-10-CM | POA: Insufficient documentation

## 2017-09-24 DIAGNOSIS — Z79899 Other long term (current) drug therapy: Secondary | ICD-10-CM | POA: Diagnosis not present

## 2017-09-24 DIAGNOSIS — R5383 Other fatigue: Secondary | ICD-10-CM | POA: Insufficient documentation

## 2017-09-24 DIAGNOSIS — M797 Fibromyalgia: Secondary | ICD-10-CM | POA: Insufficient documentation

## 2017-09-24 DIAGNOSIS — D509 Iron deficiency anemia, unspecified: Secondary | ICD-10-CM | POA: Diagnosis not present

## 2017-09-24 DIAGNOSIS — Z8673 Personal history of transient ischemic attack (TIA), and cerebral infarction without residual deficits: Secondary | ICD-10-CM | POA: Insufficient documentation

## 2017-09-24 DIAGNOSIS — M1990 Unspecified osteoarthritis, unspecified site: Secondary | ICD-10-CM

## 2017-09-24 LAB — CMP (CANCER CENTER ONLY)
ALBUMIN: 3.4 g/dL — AB (ref 3.5–5.0)
ALK PHOS: 113 U/L — AB (ref 26–84)
ALT: 15 U/L (ref 10–47)
AST: 20 U/L (ref 11–38)
Anion gap: 10 (ref 5–15)
BUN: 13 mg/dL (ref 7–22)
CALCIUM: 9.1 mg/dL (ref 8.0–10.3)
CO2: 30 mmol/L (ref 18–33)
CREATININE: 0.6 mg/dL (ref 0.60–1.20)
Chloride: 104 mmol/L (ref 98–108)
GLUCOSE: 91 mg/dL (ref 73–118)
Potassium: 3.1 mmol/L — ABNORMAL LOW (ref 3.3–4.7)
SODIUM: 144 mmol/L (ref 128–145)
TOTAL PROTEIN: 6.8 g/dL (ref 6.4–8.1)
Total Bilirubin: 0.5 mg/dL (ref 0.2–1.6)

## 2017-09-24 LAB — SAVE SMEAR

## 2017-09-24 LAB — CBC WITH DIFFERENTIAL (CANCER CENTER ONLY)
BASOS PCT: 0 %
Basophils Absolute: 0 10*3/uL (ref 0.0–0.1)
Eosinophils Absolute: 0 10*3/uL (ref 0.0–0.5)
Eosinophils Relative: 0 %
HEMATOCRIT: 35.2 % (ref 34.8–46.6)
Hemoglobin: 10.9 g/dL — ABNORMAL LOW (ref 11.6–15.9)
LYMPHS ABS: 5.7 10*3/uL — AB (ref 0.9–3.3)
Lymphocytes Relative: 43 %
MCH: 24.2 pg — AB (ref 26.0–34.0)
MCHC: 31 g/dL — AB (ref 32.0–36.0)
MCV: 78 fL — AB (ref 81.0–101.0)
MONO ABS: 1.1 10*3/uL — AB (ref 0.1–0.9)
MONOS PCT: 9 %
NEUTROS ABS: 6.3 10*3/uL (ref 1.5–6.5)
NEUTROS PCT: 48 %
Platelet Count: 417 10*3/uL — ABNORMAL HIGH (ref 145–400)
RBC: 4.51 MIL/uL (ref 3.70–5.32)
RDW: 17.6 % — AB (ref 11.1–15.7)
WBC Count: 13.1 10*3/uL — ABNORMAL HIGH (ref 3.9–10.0)

## 2017-09-24 LAB — TECHNOLOGIST SMEAR REVIEW

## 2017-09-24 NOTE — Telephone Encounter (Signed)
I placed echo order at home. Could you print order so referral can be made. Please make patient aware of order being placed.

## 2017-09-24 NOTE — Addendum Note (Signed)
Addended by: Donella Stade on: 09/24/2017 11:20 PM   Modules accepted: Orders

## 2017-09-24 NOTE — Telephone Encounter (Signed)
Pt called and lvm stating that Sophia Smith had mentioned possibly getting another echo and she would like to know if you still wanted to order that and her get it done because she has not heard either way. Thanks

## 2017-09-25 LAB — IRON AND TIBC
Iron: 18 ug/dL — ABNORMAL LOW (ref 41–142)
SATURATION RATIOS: 4 % — AB (ref 21–57)
TIBC: 431 ug/dL (ref 236–444)
UIBC: 413 ug/dL

## 2017-09-25 LAB — RETICULOCYTES
RBC.: 4.59 MIL/uL (ref 3.70–5.45)
Retic Count, Absolute: 41.3 10*3/uL (ref 33.7–90.7)
Retic Ct Pct: 0.9 % (ref 0.7–2.1)

## 2017-09-25 LAB — FERRITIN: Ferritin: 7 ng/mL — ABNORMAL LOW (ref 9–269)

## 2017-09-25 LAB — ERYTHROPOIETIN: ERYTHROPOIETIN: 63.8 m[IU]/mL — AB (ref 2.6–18.5)

## 2017-09-26 LAB — HEMOGLOBINOPATHY EVALUATION
HGB C: 0 %
HGB F QUANT: 0 % (ref 0.0–2.0)
HGB S QUANTITAION: 0 %
Hgb A2 Quant: 1.7 % — ABNORMAL LOW (ref 1.8–3.2)
Hgb A: 98.3 % (ref 96.4–98.8)
Hgb Variant: 0 %

## 2017-09-26 NOTE — Progress Notes (Signed)
Referral MD  Reason for Referral: Iron deficiency anemia  Chief Complaint  Patient presents with  . New Patient (Initial Visit)  : My iron is low.  I just feel tired all the time.  HPI: Sophia Smith is a very nice 57 year old white female.  She is a retired [employer] scientist.  She has fibromyalgia.  She has a lot of arthritis.  She has back issues from her job.  She has been feeling quite tired.  She does not have a lot of energy.  She has been found to have be anemic.  Back in December 2017, her CBC showed a white count of 8.9.  Hemoglobin 8.4.  Platelet count 366,000.  Her MCV was 75.  In October 2018, her white cell count was 9.7.  Hemoglobin 11.7.  Platelet count 444,000.  MCV was 74.  She was quite iron deficient.  Her iron studies at that time showed a ferritin of 7 with an iron saturation of 6%.  I think she has been on oral iron.  I think she had a tough time with oral iron.  Most recently, in December 2018, her labs showed a CBC of white cell count of 8.1..  Platelet count 391,000.  MCV was 75.  She has had some issues with menometrorrhagia in the past.  She is had no hematuria.  There is been no melena or bright red blood per rectum.  I think she has had a colonoscopy.  Her last mammogram was last year.  There is no family history of anemia.  She has had no weight loss or weight gain.  She is not a vegetarian.  She was a past smoker.  She stopped in 2015.  There is no history of alcohol use.  She has had a cholecystectomy.  She has had diverticulitis and had part of her sigmoid colon removed.  She has had some issues with trying to take oral iron.  She is kindly referred to the [place] [facility] for evaluation and to see about IV iron.    Past Medical History:  Diagnosis Date  . Back disorder   . Cellulitis of left lower leg 08/04/2017  . Fibromyalgia   . Knee gives out   . Other disorders of continuity of bone, right pelvic region and thigh   . Stroke [facility])    :  Past Surgical History:  Procedure Laterality Date  . CHOLECYSTECTOMY    . LEG SURGERY Bilateral    numerous leg surgeries post accident age 49  . SIGMOID RESECTION / RECTOPEXY    :   Current Outpatient Medications:  .  cloNIDine (CATAPRES) 0.1 MG tablet, Take 1 tablet (0.1 mg total) by mouth 2 (two) times daily as needed (opioid withdrawal symptoms)., Disp: 10 tablet, Rfl: 0 .  cyclobenzaprine (FLEXERIL) 10 MG tablet, Take 1 tablet (10 mg total) by mouth at bedtime., Disp: 30 tablet, Rfl: 5 .  diclofenac sodium (VOLTAREN) 1 % GEL, Apply 4 g topically 4 (four) times daily., Disp: 1 Tube, Rfl: 3 .  DULoxetine (CYMBALTA) 30 MG capsule, Take one tablet for 2 weeks then increase to 2 tablets daily., Disp: 60 capsule, Rfl: 1 .  furosemide (LASIX) 40 MG tablet, Take 1 tablet (40 mg total) by mouth daily. As needed for lower extremity edema., Disp: 30 tablet, Rfl: 1 .  gabapentin (NEURONTIN) 800 MG tablet, Take 800 mg by mouth 3 (three) times daily., Disp: , Rfl:  .  losartan (COZAAR) 50 MG tablet, Take 1 tablet (  50 mg total) by mouth daily., Disp: 30 tablet, Rfl: 3 .  oxyCODONE-acetaminophen (PERCOCET) 10-325 MG tablet, Take 1 tablet by mouth every 6 (six) hours as needed for 3 days for pain, THEN 1 tablet every 8 (eight) hours as needed for 3 days for pain, THEN 1 tablet every 12 (twelve) hours as needed for up to 3 days for pain., Disp: 27 tablet, Rfl: 0 .  traZODone (DESYREL) 50 MG tablet, Take 1-2 tablets (50-100 mg total) by mouth at bedtime as needed for sleep., Disp: 30 tablet, Rfl: 0:  :  Allergies  Allergen Reactions  . Morphine And Related   . Nsaids     Aleve,ibuprofen all seems to caused sharp pains in abdominal and discomfort.   :  Family History  Problem Relation Age of Onset  . Heart attack Father   . Cancer Father   :  Social History   Socioeconomic History  . Marital status: Married    Spouse name: Not on file  . Number of children: Not on file  . Years of  education: Not on file  . Highest education level: Not on file  Social Needs  . Financial resource strain: Not on file  . Food insecurity - worry: Not on file  . Food insecurity - inability: Not on file  . Transportation needs - medical: Not on file  . Transportation needs - non-medical: Not on file  Occupational History  . Not on file  Tobacco Use  . Smoking status: Current Every Day Smoker    Packs/day: 0.50    Types: Cigarettes    Last attempt to quit: 08/23/2013    Years since quitting: 4.0  . Smokeless tobacco: Never Used  Substance and Sexual Activity  . Alcohol use: No  . Drug use: No  . Sexual activity: Yes  Other Topics Concern  . Not on file  Social History Narrative  . Not on file  :  Review of Systems  Constitutional: Positive for malaise/fatigue.  Eyes: Negative.   Respiratory: Negative.   Cardiovascular: Positive for leg swelling.  Gastrointestinal: Negative.   Genitourinary: Negative.   Musculoskeletal: Positive for back pain and joint pain.  Skin: Negative.   Neurological: Negative.   Endo/Heme/Allergies: Negative.   Psychiatric/Behavioral: Negative.      Exam: Well-developed and well-nourished white female in no obvious distress.  Vital signs show a temperature of 98.7.  Pulse 87.  Blood pressure 169/100.  Weight is 169 pounds.  Head exam shows no ocular or oral lesions.  Her conjunctiva are slightly pale.  Lungs are clear bilaterally.  Cardiac exam regular rate and rhythm with no murmurs, rubs or bruits.  Abdomen is soft.  She has good bowel sounds.  She has laparoscopy scars.  She has no fluid wave.  There is no palpable liver or spleen tip.  Back exam shows no tenderness over the spine, ribs or hips.  Extremities shows no clubbing or cyanosis.  She has some chronic mild nonpitting edema in her legs.  Skin exam shows no rashes, ecchymoses or petechia.  Neurological exam shows no focal neurological deficits. @IPVITALS @   Recent Labs    09/24/17 1403   WBC 13.1*  HCT 35.2  PLT 417*   Recent Labs    09/24/17 1403  NA 144  K 3.1*  CL 104  CO2 30  GLUCOSE 91  BUN 13  CREATININE 0.60  CALCIUM 9.1    Blood smear review: Mild anisocytosis and poikilocytosis.  There is some  hypochromic microcytic red blood cells.  I see no nucleated red blood cells.  She has no teardrop cells.  There is no rouleaux formation.  White blood cells appear normal in morphology maturation.  She has no hypersegmented polys.  There is no immature myeloid or lymphoid forms.  Platelets are slightly increased in number.  Platelets are well granulated.   Pathology: None    Assessment and Plan: Sophia Smith is a very nice 57 year old white female.  She has iron deficiency anemia.  Her iron studies today showed a ferritin of 7 with an iron saturation of 4%.  We will go ahead and give her IV iron.  I think this will help her out.  I think this will make her feel better.  I think she will have more energy.  I spent about 45 minutes with her.  Over 50% of time was spent face-to-face with her.  I went over all of her lab work.  I explained why IV iron is often more effective than oral iron.  I went over the side effects of IV iron.  She understands all the.  She is agreeable to try this.  She will need 2 doses.  I will then get her back to be seen in about 6 weeks.  Hopefully, by then, we will see an improvement in her hematologic parameters.

## 2017-09-29 ENCOUNTER — Ambulatory Visit (INDEPENDENT_AMBULATORY_CARE_PROVIDER_SITE_OTHER): Payer: Medicare HMO | Admitting: Sports Medicine

## 2017-09-29 ENCOUNTER — Encounter: Payer: Self-pay | Admitting: Sports Medicine

## 2017-09-29 DIAGNOSIS — M4306 Spondylolysis, lumbar region: Secondary | ICD-10-CM

## 2017-09-29 DIAGNOSIS — I10 Essential (primary) hypertension: Secondary | ICD-10-CM | POA: Diagnosis not present

## 2017-09-29 MED ORDER — VALSARTAN-HYDROCHLOROTHIAZIDE 320-25 MG PO TABS: 1.0000 | ORAL_TABLET | Freq: Every day | ORAL | 3 refills | Status: DC

## 2017-09-29 MED ORDER — METHOCARBAMOL 500 MG PO TABS: 500.0000 mg | ORAL_TABLET | Freq: Three times a day (TID) | ORAL | 0 refills | Status: DC

## 2017-09-29 NOTE — Assessment & Plan Note (Signed)
Blood pressure has been uncontrolled on multiple occasions. Ultimately we may discontinue clonidine which can result in rebound hypertension. I understand that this is being used for her opiate withdrawal, so no changes in this for now. Switching from losartan to valsartan/HCTZ. Return in 2 weeks for a blood pressure check.

## 2017-09-29 NOTE — Assessment & Plan Note (Addendum)
The prominent finding on lumbar spine MRI is L3-L4 and L4-L5 facet arthritis. She has had some injections but I do not have any records. She is also been on chronic narcotics from multiple providers. Doing a down taper on this, I did explain to her that there would be on symptoms, and she would have pain, but that the best course of action was to try nonnarcotic methods. I also explained to her that the expectation and the ultimate goal should not be a for complete pain relief but more improvements in pain to the point where she would remain functional. She was understanding and accepting of this. Continue Cymbalta 60 for now, if persistent pain in a month we will go up to 120. Adding methocarbamol. She can use Tylenol as needed, excessive gastritis with NSAIDs. Continue gabapentin 800 mg 3 times per day. I am going to set her up for medial branch blocks in anticipation of radiofrequency ablation for the bilateral L3-L5 facet joints.

## 2017-09-29 NOTE — Progress Notes (Signed)
Subjective:    I'm seeing this patient as a consultation for: Iran Planas, PA-C  CC: Back pain  HPI: This is a pleasant 57 year old female, she has had long-standing and chronic low back pain, she does have real pathology on her lumbar spine MRI, see below.  She is been treated in the pain management clinic, it was noted on a routine drug screen that there was morphine which was not prescribed.  She was no longer allowed to have narcotics at the pain management clinic.  She is gotten several prescriptions of oxycodone from here, and is now currently in a down taper.  Not surprisingly having some odd symptoms, pain.  Pain is axial, facetogenic.  She was started appropriately on Cymbalta, and continues with gabapentin 800 mg 3 times per day.  No bowel or bladder dysfunction, saddle numbness, constitutional symptoms.  Elevated blood pressure: Minimal headaches, no chest pain.  No focal neurologic symptoms.  Currently on losartan 50, furosemide, coming off of her clonidine which was prescribed for opiate withdrawal.  I reviewed the past medical history, family history, social history, surgical history, and allergies today and no changes were needed.  Please see the problem list section below in epic for further details.  Past Medical History: Past Medical History:  Diagnosis Date  . Back disorder   . Cellulitis of left lower leg 08/04/2017  . Fibromyalgia   . Knee gives out   . Other disorders of continuity of bone, right pelvic region and thigh   . Stroke [facility])    Past Surgical History: Past Surgical History:  Procedure Laterality Date  . CHOLECYSTECTOMY    . LEG SURGERY Bilateral    numerous leg surgeries post accident age 71  . SIGMOID RESECTION / RECTOPEXY     Social History: Social History   Socioeconomic History  . Marital status: Married    Spouse name: None  . Number of children: None  . Years of education: None  . Highest education level: None  Social Needs  .  Financial resource strain: None  . Food insecurity - worry: None  . Food insecurity - inability: None  . Transportation needs - medical: None  . Transportation needs - non-medical: None  Occupational History  . None  Tobacco Use  . Smoking status: Current Every Day Smoker    Packs/day: 0.50    Types: Cigarettes    Last attempt to quit: 08/23/2013    Years since quitting: 4.1  . Smokeless tobacco: Never Used  Substance and Sexual Activity  . Alcohol use: No  . Drug use: No  . Sexual activity: Yes  Other Topics Concern  . None  Social History Narrative  . None   Family History: Family History  Problem Relation Age of Onset  . Heart attack Father   . Cancer Father    Allergies: Allergies  Allergen Reactions  . Morphine And Related   . Nsaids     Aleve,ibuprofen all seems to caused sharp pains in abdominal and discomfort.    Medications: See med rec.  Review of Systems: No headache, visual changes, nausea, vomiting, diarrhea, constipation, dizziness, abdominal pain, skin rash, fevers, chills, night sweats, weight loss, swollen lymph nodes, body aches, joint swelling, muscle aches, chest pain, shortness of breath, mood changes, visual or auditory hallucinations.   Objective:   General: Well Developed, well nourished, and in no acute distress.  Neuro:  Extra-ocular muscles intact, able to move all 4 extremities, sensation grossly intact.  Deep tendon  reflexes tested were normal. Psych: Alert and oriented, mood congruent with affect. ENT:  Ears and nose appear unremarkable.  Hearing grossly normal. Neck: Unremarkable overall appearance, trachea midline.  No visible thyroid enlargement. Eyes: Conjunctivae and lids appear unremarkable.  Pupils equal and round. Skin: Warm and dry, no rashes noted.  Cardiovascular: Pulses palpable, no extremity edema.  Lumbar spine MRI personally reviewed, there is severe facet arthritis at L4-L5, bilaterally and lesser so at L3-L4.  Not a whole  lot in terms of disc pathology.  Impression and Recommendations:   This case required medical decision making of moderate complexity.  Lumbar spondylolysis The prominent finding on lumbar spine MRI is L3-L4 and L4-L5 facet arthritis. She has had some injections but I do not have any records. She is also been on chronic narcotics from multiple providers. Doing a down taper on this, I did explain to her that there would be on symptoms, and she would have pain, but that the best course of action was to try nonnarcotic methods. I also explained to her that the expectation and the ultimate goal should not be a for complete pain relief but more improvements in pain to the point where she would remain functional. She was understanding and accepting of this. Continue Cymbalta 60 for now, if persistent pain in a month we will go up to 120. Adding methocarbamol. She can use Tylenol as needed, excessive gastritis with NSAIDs. Continue gabapentin 800 mg 3 times per day. I am going to set her up for medial branch blocks in anticipation of radiofrequency ablation for the bilateral L3-L5 facet joints.  Uncontrolled stage 2 hypertension Blood pressure has been uncontrolled on multiple occasions. Ultimately we may discontinue clonidine which can result in rebound hypertension. I understand that this is being used for her opiate withdrawal, so no changes in this for now. Switching from losartan to valsartan/HCTZ. Return in 2 weeks for a blood pressure check.  ___________________________________________ Gwen Her. Dianah Field, M.D., ABFM., CAQSM. Primary Care and [place] Instructor of [employer] [facility] [employer]

## 2017-09-30 ENCOUNTER — Inpatient Hospital Stay: Payer: Medicare HMO

## 2017-09-30 DIAGNOSIS — M545 Low back pain: Secondary | ICD-10-CM | POA: Diagnosis not present

## 2017-09-30 DIAGNOSIS — Z5181 Encounter for therapeutic drug level monitoring: Secondary | ICD-10-CM | POA: Diagnosis not present

## 2017-09-30 DIAGNOSIS — G894 Chronic pain syndrome: Secondary | ICD-10-CM | POA: Diagnosis not present

## 2017-09-30 DIAGNOSIS — Z79899 Other long term (current) drug therapy: Secondary | ICD-10-CM | POA: Diagnosis not present

## 2017-09-30 DIAGNOSIS — G8929 Other chronic pain: Secondary | ICD-10-CM | POA: Diagnosis not present

## 2017-10-01 ENCOUNTER — Other Ambulatory Visit: Payer: Self-pay | Admitting: Sports Medicine

## 2017-10-01 ENCOUNTER — Ambulatory Visit: Payer: Medicare PPO | Admitting: Physician Assistant

## 2017-10-01 DIAGNOSIS — M4306 Spondylolysis, lumbar region: Secondary | ICD-10-CM

## 2017-10-02 DIAGNOSIS — Z01419 Encounter for gynecological examination (general) (routine) without abnormal findings: Secondary | ICD-10-CM | POA: Diagnosis not present

## 2017-10-02 DIAGNOSIS — Z780 Asymptomatic menopausal state: Secondary | ICD-10-CM | POA: Diagnosis not present

## 2017-10-03 ENCOUNTER — Other Ambulatory Visit: Payer: Self-pay

## 2017-10-03 ENCOUNTER — Inpatient Hospital Stay: Payer: Medicare HMO | Attending: Hematology & Oncology

## 2017-10-03 VITALS — BP 123/60 | HR 74 | Temp 99.0°F | Resp 18

## 2017-10-03 DIAGNOSIS — M549 Dorsalgia, unspecified: Secondary | ICD-10-CM | POA: Insufficient documentation

## 2017-10-03 DIAGNOSIS — R5383 Other fatigue: Secondary | ICD-10-CM | POA: Insufficient documentation

## 2017-10-03 DIAGNOSIS — Z87891 Personal history of nicotine dependence: Secondary | ICD-10-CM | POA: Insufficient documentation

## 2017-10-03 DIAGNOSIS — M1990 Unspecified osteoarthritis, unspecified site: Secondary | ICD-10-CM | POA: Insufficient documentation

## 2017-10-03 DIAGNOSIS — K648 Other hemorrhoids: Secondary | ICD-10-CM

## 2017-10-03 DIAGNOSIS — R5381 Other malaise: Secondary | ICD-10-CM | POA: Diagnosis not present

## 2017-10-03 DIAGNOSIS — D509 Iron deficiency anemia, unspecified: Secondary | ICD-10-CM | POA: Diagnosis not present

## 2017-10-03 DIAGNOSIS — D500 Iron deficiency anemia secondary to blood loss (chronic): Secondary | ICD-10-CM

## 2017-10-03 DIAGNOSIS — Z79899 Other long term (current) drug therapy: Secondary | ICD-10-CM | POA: Insufficient documentation

## 2017-10-03 DIAGNOSIS — M797 Fibromyalgia: Secondary | ICD-10-CM | POA: Insufficient documentation

## 2017-10-03 DIAGNOSIS — Z8673 Personal history of transient ischemic attack (TIA), and cerebral infarction without residual deficits: Secondary | ICD-10-CM | POA: Insufficient documentation

## 2017-10-03 MED ORDER — SODIUM CHLORIDE 0.9 % IV SOLN
Freq: Once | INTRAVENOUS | Status: AC
  Administered 2017-10-03: 13:00:00 via INTRAVENOUS

## 2017-10-03 MED ORDER — SODIUM CHLORIDE 0.9 % IV SOLN
510.0000 mg | Freq: Once | INTRAVENOUS | Status: AC
  Administered 2017-10-03: 510 mg via INTRAVENOUS
  Filled 2017-10-03: qty 17

## 2017-10-03 NOTE — Progress Notes (Signed)
Patient tolerated first time Feraheme without complaints. Patient discharged ambulatory.

## 2017-10-03 NOTE — Patient Instructions (Signed)

## 2017-10-07 ENCOUNTER — Ambulatory Visit (INDEPENDENT_AMBULATORY_CARE_PROVIDER_SITE_OTHER): Payer: Medicare HMO | Admitting: Physician Assistant

## 2017-10-07 ENCOUNTER — Ambulatory Visit (INDEPENDENT_AMBULATORY_CARE_PROVIDER_SITE_OTHER): Payer: Medicare HMO

## 2017-10-07 ENCOUNTER — Encounter: Payer: Self-pay | Admitting: Physician Assistant

## 2017-10-07 VITALS — BP 139/83 | HR 98 | Ht 69.0 in | Wt 167.0 lb

## 2017-10-07 DIAGNOSIS — S6992XA Unspecified injury of left wrist, hand and finger(s), initial encounter: Secondary | ICD-10-CM | POA: Diagnosis not present

## 2017-10-07 DIAGNOSIS — M25532 Pain in left wrist: Secondary | ICD-10-CM

## 2017-10-07 DIAGNOSIS — M85832 Other specified disorders of bone density and structure, left forearm: Secondary | ICD-10-CM | POA: Diagnosis not present

## 2017-10-07 DIAGNOSIS — W19XXXA Unspecified fall, initial encounter: Secondary | ICD-10-CM

## 2017-10-07 DIAGNOSIS — M79605 Pain in left leg: Secondary | ICD-10-CM

## 2017-10-07 DIAGNOSIS — L03116 Cellulitis of left lower limb: Secondary | ICD-10-CM

## 2017-10-07 MED ORDER — DOXYCYCLINE HYCLATE 100 MG PO TABS: 100.0000 mg | ORAL_TABLET | Freq: Two times a day (BID) | ORAL | 0 refills | Status: DC

## 2017-10-07 NOTE — Progress Notes (Signed)
No fracture. Continue with symptomatic treatment as discussed in office.

## 2017-10-07 NOTE — Progress Notes (Signed)
Subjective:     Patient ID: Sophia Smith, female   DOB: 1960-11-12, 57 y.o.   MRN: [id number]  HPI   Sophia Smith is a 57 year old female with past medical history of back pain, cellulitis, fibromyalgia, left knee replacement and CVA presented to the office with left lower leg swelling and erythema. Patient was trying to use bathroom Saturday night when she can not bear her weight on the left side and fall. She states landing on her right side. While trying to prevent fall, she sustained an injury at her left wrist when she tried to grab a corner of the dressing table. Patient has multiple episodes of cellulitis in the past and needed extensive treatment with multiple antibiotic agents. Patient is also s/p left knee replacement and concern about spreading cellulitis to her prosthetic joint. She states that her left lower leg edema and redness is hot to touch,  was limited to her left ankle initially, but now spreading proximally towards  left lower shin area.She rate her pain around that area 5/10. She tried to place ice and elevation of feet without much relief. Walking or standing makes her symptoms worse. Patient also has chronic back pain but her back pain is worse after her fall.  She is also seen at pain clinic and she is on methadone treatment. She also complains about tenderness at left wrist. She pointed the pain over scaphoid bone and describe pain 5/10.   .. Active Ambulatory Problems    Diagnosis Date Noted  . Lymphedema of leg 09/13/2013  . Pneumothorax on left 09/13/2013  . Left rib fracture 09/13/2013  . Fibromyalgia 09/13/2013  . Lumbar disc disease 09/13/2013  . Aneurysm artery, neck ([place]) 09/15/2013  . Pelvic fracture ([place]) 09/15/2013  . Depression 09/15/2013  . Elevated alkaline phosphatase level 10/15/2013  . Bilateral lower extremity edema 10/26/2013  . Left knee pain 10/26/2013  . Tricompartment degenerative joint disease of knee 10/26/2013  . Plantar fasciitis, bilateral  03/21/2014  . S/P total knee replacement 09/13/2014  . Left ankle pain 09/13/2014  . Insomnia secondary to chronic pain 06/01/2015  . Upper back pain on right side 06/01/2015  . Internal hemorrhoid 12/05/2015  . Seroma, post-traumatic ([place]) 05/13/2017  . Iron deficiency anemia 05/13/2017  . Wound of left leg 08/04/2017  . Drug-seeking behavior 09/19/2017  . Uncontrolled stage 2 hypertension 09/19/2017  . Opioid dependence with withdrawal ([place]) 09/19/2017  . Acute exacerbation of chronic low back pain 09/19/2017  . Cigarette nicotine dependence without complication 75/64/3329  . Lumbar spondylolysis 09/23/2017   Resolved Ambulatory Problems    Diagnosis Date Noted  . Acute stress reaction 06/01/2015  . Cellulitis of left lower leg 08/04/2017   Past Medical History:  Diagnosis Date  . Back disorder   . Cellulitis of left lower leg 08/04/2017  . Fibromyalgia   . Knee gives out   . Other disorders of continuity of bone, right pelvic region and thigh   . Stroke [facility])        Review of Systems  All other systems reviewed and are negative.      Objective:   Physical Exam  Constitutional: She is oriented to person, place, and time. She appears well-developed and well-nourished.  HENT:  Head: Normocephalic and atraumatic.  Cardiovascular: Normal rate, regular rhythm and normal heart sounds.  Pulmonary/Chest: Effort normal and breath sounds normal.  Musculoskeletal:  Erythema over left lower leg with non pitting edema from left ankle into left mid calf.  Warm to touch. No abrasion. Tender to palpation over anterior leg.   Tenderness over palmar left wrist radial side to palpation. No bruising, swelling.  Hand grip 5/5.  Negative finkelstein.  Pain with flexion at wrist.   Neurological: She is alert and oriented to person, place, and time.  Psychiatric: She has a normal mood and affect. Her behavior is normal.       Assessment:     Marland KitchenMarland KitchenDiagnoses and all orders for this  visit:  Left wrist pain -     DG Wrist Complete Left; Future  Left leg pain  Fall, initial encounter -     DG Wrist Complete Left; Future  Left leg cellulitis -     doxycycline (VIBRA-TABS) 100 MG tablet; Take 1 tablet (100 mg total) by mouth 2 (two) times daily.       Plan:     Patient's leg is concerning for cellulitis.  She has a history of recurrent cellulitis and with her knee replacement I want to be extra careful.  Sent doxycycline for 10 days.  Encouraged her to elevate leg and use warm compresses.  She does have some Lasix that she could take for swelling.  Follow-up if not improving or if symptoms worsening.  She does have some tenderness right over the radial wrist.  We will get x-ray to rule out any fracture.  For now use ice over wrist and rest for the next few days. Does not seem to be inflammation over tendon. Will call if anything concerning on x-ray.  Patient does report she is going to pain clinic now and they have started her on methadone as well as she is getting a back stimulator to see if it helps with her symptoms.

## 2017-10-07 NOTE — Patient Instructions (Signed)

## 2017-10-10 ENCOUNTER — Other Ambulatory Visit: Payer: Self-pay

## 2017-10-10 ENCOUNTER — Inpatient Hospital Stay: Payer: Medicare HMO

## 2017-10-10 VITALS — BP 114/69 | HR 84 | Temp 98.6°F | Resp 20

## 2017-10-10 DIAGNOSIS — K648 Other hemorrhoids: Secondary | ICD-10-CM

## 2017-10-10 DIAGNOSIS — M1990 Unspecified osteoarthritis, unspecified site: Secondary | ICD-10-CM | POA: Diagnosis not present

## 2017-10-10 DIAGNOSIS — D509 Iron deficiency anemia, unspecified: Secondary | ICD-10-CM | POA: Diagnosis not present

## 2017-10-10 DIAGNOSIS — R5383 Other fatigue: Secondary | ICD-10-CM | POA: Diagnosis not present

## 2017-10-10 DIAGNOSIS — Z8673 Personal history of transient ischemic attack (TIA), and cerebral infarction without residual deficits: Secondary | ICD-10-CM | POA: Diagnosis not present

## 2017-10-10 DIAGNOSIS — M549 Dorsalgia, unspecified: Secondary | ICD-10-CM | POA: Diagnosis not present

## 2017-10-10 DIAGNOSIS — Z79899 Other long term (current) drug therapy: Secondary | ICD-10-CM | POA: Diagnosis not present

## 2017-10-10 DIAGNOSIS — M797 Fibromyalgia: Secondary | ICD-10-CM | POA: Diagnosis not present

## 2017-10-10 DIAGNOSIS — Z87891 Personal history of nicotine dependence: Secondary | ICD-10-CM | POA: Diagnosis not present

## 2017-10-10 DIAGNOSIS — R5381 Other malaise: Secondary | ICD-10-CM | POA: Diagnosis not present

## 2017-10-10 DIAGNOSIS — D500 Iron deficiency anemia secondary to blood loss (chronic): Secondary | ICD-10-CM

## 2017-10-10 MED ORDER — SODIUM CHLORIDE 0.9 % IV SOLN
510.0000 mg | Freq: Once | INTRAVENOUS | Status: AC
  Administered 2017-10-10: 510 mg via INTRAVENOUS
  Filled 2017-10-10: qty 17

## 2017-10-10 MED ORDER — SODIUM CHLORIDE 0.9 % IV SOLN
Freq: Once | INTRAVENOUS | Status: AC
  Administered 2017-10-10: 12:00:00 via INTRAVENOUS

## 2017-10-10 NOTE — Patient Instructions (Signed)

## 2017-10-13 ENCOUNTER — Ambulatory Visit: Payer: Medicare HMO | Admitting: Physician Assistant

## 2017-10-13 DIAGNOSIS — G894 Chronic pain syndrome: Secondary | ICD-10-CM | POA: Diagnosis not present

## 2017-10-13 DIAGNOSIS — M545 Low back pain: Secondary | ICD-10-CM | POA: Diagnosis not present

## 2017-10-13 DIAGNOSIS — G8929 Other chronic pain: Secondary | ICD-10-CM | POA: Diagnosis not present

## 2017-10-21 DIAGNOSIS — G894 Chronic pain syndrome: Secondary | ICD-10-CM | POA: Diagnosis not present

## 2017-10-22 ENCOUNTER — Encounter: Payer: Self-pay | Admitting: Physician Assistant

## 2017-10-22 ENCOUNTER — Ambulatory Visit (HOSPITAL_BASED_OUTPATIENT_CLINIC_OR_DEPARTMENT_OTHER)
Admission: RE | Admit: 2017-10-22 | Discharge: 2017-10-22 | Disposition: A | Discharge status: Home / Self Care | Payer: Medicare HMO | Source: Ambulatory Visit | Attending: Physician Assistant | Admitting: Physician Assistant

## 2017-10-22 DIAGNOSIS — M7989 Other specified soft tissue disorders: Secondary | ICD-10-CM | POA: Diagnosis not present

## 2017-10-22 DIAGNOSIS — I119 Hypertensive heart disease without heart failure: Secondary | ICD-10-CM | POA: Insufficient documentation

## 2017-10-22 DIAGNOSIS — I081 Rheumatic disorders of both mitral and tricuspid valves: Secondary | ICD-10-CM | POA: Diagnosis not present

## 2017-10-22 DIAGNOSIS — R011 Cardiac murmur, unspecified: Secondary | ICD-10-CM | POA: Diagnosis not present

## 2017-10-22 DIAGNOSIS — I340 Nonrheumatic mitral (valve) insufficiency: Secondary | ICD-10-CM | POA: Diagnosis not present

## 2017-10-22 DIAGNOSIS — I517 Cardiomegaly: Secondary | ICD-10-CM | POA: Insufficient documentation

## 2017-10-22 DIAGNOSIS — I071 Rheumatic tricuspid insufficiency: Secondary | ICD-10-CM | POA: Insufficient documentation

## 2017-10-22 NOTE — Progress Notes (Signed)
Call pt: - 1. Left ventricular systolic function is preserved visually estimated at 60-65%. 2. Moderate mitral regurgitation. 3. Mild to moderate left atrial enlargement. 4. Mild tricuspid regurgitation.  Enough to create murmur on exam. Will continue to monitor but no acute concerns.

## 2017-10-22 NOTE — Progress Notes (Signed)
Echocardiogram 2D Echocardiogram has been performed.  Joelene Millin 10/22/2017, 3:46 PM

## 2017-10-28 DIAGNOSIS — H5200 Hypermetropia, unspecified eye: Secondary | ICD-10-CM | POA: Diagnosis not present

## 2017-11-03 DIAGNOSIS — H25813 Combined forms of age-related cataract, bilateral: Secondary | ICD-10-CM | POA: Diagnosis not present

## 2017-11-03 DIAGNOSIS — H527 Unspecified disorder of refraction: Secondary | ICD-10-CM | POA: Diagnosis not present

## 2017-11-04 ENCOUNTER — Encounter: Payer: Self-pay | Admitting: Physician Assistant

## 2017-11-04 DIAGNOSIS — H527 Unspecified disorder of refraction: Secondary | ICD-10-CM | POA: Insufficient documentation

## 2017-11-04 DIAGNOSIS — H269 Unspecified cataract: Secondary | ICD-10-CM | POA: Insufficient documentation

## 2017-11-06 ENCOUNTER — Telehealth: Payer: Self-pay

## 2017-11-06 ENCOUNTER — Inpatient Hospital Stay: Payer: Medicare HMO | Attending: Hematology & Oncology | Admitting: Hematology & Oncology

## 2017-11-06 ENCOUNTER — Other Ambulatory Visit: Payer: Self-pay | Admitting: Physician Assistant

## 2017-11-06 ENCOUNTER — Telehealth: Payer: Self-pay | Admitting: *Deleted

## 2017-11-06 ENCOUNTER — Inpatient Hospital Stay: Payer: Medicare HMO

## 2017-11-06 ENCOUNTER — Other Ambulatory Visit: Payer: Self-pay

## 2017-11-06 VITALS — BP 135/96 | HR 100 | Temp 98.8°F | Resp 18 | Wt 156.0 lb

## 2017-11-06 DIAGNOSIS — D508 Other iron deficiency anemias: Secondary | ICD-10-CM | POA: Insufficient documentation

## 2017-11-06 DIAGNOSIS — I071 Rheumatic tricuspid insufficiency: Secondary | ICD-10-CM | POA: Diagnosis not present

## 2017-11-06 DIAGNOSIS — F1123 Opioid dependence with withdrawal: Secondary | ICD-10-CM

## 2017-11-06 DIAGNOSIS — Z79899 Other long term (current) drug therapy: Secondary | ICD-10-CM

## 2017-11-06 DIAGNOSIS — K909 Intestinal malabsorption, unspecified: Secondary | ICD-10-CM | POA: Insufficient documentation

## 2017-11-06 DIAGNOSIS — H269 Unspecified cataract: Secondary | ICD-10-CM | POA: Insufficient documentation

## 2017-11-06 DIAGNOSIS — D500 Iron deficiency anemia secondary to blood loss (chronic): Secondary | ICD-10-CM

## 2017-11-06 DIAGNOSIS — R5383 Other fatigue: Secondary | ICD-10-CM | POA: Insufficient documentation

## 2017-11-06 DIAGNOSIS — M549 Dorsalgia, unspecified: Secondary | ICD-10-CM

## 2017-11-06 DIAGNOSIS — G4701 Insomnia due to medical condition: Secondary | ICD-10-CM

## 2017-11-06 DIAGNOSIS — G8929 Other chronic pain: Secondary | ICD-10-CM

## 2017-11-06 DIAGNOSIS — I340 Nonrheumatic mitral (valve) insufficiency: Secondary | ICD-10-CM | POA: Insufficient documentation

## 2017-11-06 LAB — CBC WITH DIFFERENTIAL (CANCER CENTER ONLY)
BASOS PCT: 1 %
Basophils Absolute: 0.1 10*3/uL (ref 0.0–0.1)
Eosinophils Absolute: 0.1 10*3/uL (ref 0.0–0.5)
Eosinophils Relative: 1 %
HEMATOCRIT: 46.3 % (ref 34.8–46.6)
HEMOGLOBIN: 14.9 g/dL (ref 11.6–15.9)
LYMPHS ABS: 1.8 10*3/uL (ref 0.9–3.3)
LYMPHS PCT: 30 %
MCH: 26.8 pg (ref 26.0–34.0)
MCHC: 32.2 g/dL (ref 32.0–36.0)
MCV: 83.1 fL (ref 81.0–101.0)
MONO ABS: 0.5 10*3/uL (ref 0.1–0.9)
MONOS PCT: 8 %
NEUTROS ABS: 3.6 10*3/uL (ref 1.5–6.5)
NEUTROS PCT: 60 %
Platelet Count: 275 10*3/uL (ref 145–400)
RBC: 5.57 MIL/uL — ABNORMAL HIGH (ref 3.70–5.32)
RDW: 21.8 % — AB (ref 11.1–15.7)
WBC Count: 6 10*3/uL (ref 3.9–10.0)

## 2017-11-06 LAB — IRON AND TIBC
IRON: 89 ug/dL (ref 41–142)
Saturation Ratios: 34 % (ref 21–57)
TIBC: 259 ug/dL (ref 236–444)
UIBC: 170 ug/dL

## 2017-11-06 LAB — FERRITIN: Ferritin: 136 ng/mL (ref 9–269)

## 2017-11-06 LAB — RETICULOCYTES
RBC.: 5.42 MIL/uL (ref 3.70–5.45)
RETIC COUNT ABSOLUTE: 27.1 10*3/uL — AB (ref 33.7–90.7)
RETIC CT PCT: 0.5 % — AB (ref 0.7–2.1)

## 2017-11-06 NOTE — Telephone Encounter (Signed)
I sent to pharmacy.

## 2017-11-06 NOTE — Progress Notes (Signed)
Hematology and Oncology Follow Up Visit  Sophia Smith [id number] 1961/06/01 56 y.o. 11/06/2017   Principle Diagnosis:   Iron deficiency anemia secondary to chronic low-grade blood loss and malabsorption  Current Therapy:    IV iron with Feraheme-2 doses given with last dose on 10/10/2017     Interim History:  Sophia Smith is back for follow-up.  This is her second office visit.  We first saw her back in February.  At that time, she had marked iron deficiency.  Her iron studies at that time showed a ferritin of 7 with an iron saturation of only 4%.  Her hemoglobin was 10.9.  Her MCV was 78.  She still is feeling tired.  She does not feel as tired.  She did have an echocardiogram done a couple weeks ago.  She does have a good left ventricular ejection fraction of 60-65%.  She has moderate mitral regurgitation.  She has mild to moderate left atrial enlargement and mild tricuspid regurgitation.  The bad news that she needs bilateral cataract surgery.  This will be done on April 22.  A week later she will have the left eye done.  I am not sure why she has the cataracts.  She has had steroid injections for her back.  Her daughter comes in with her as her daughter has to drive her around since Sophia Smith cannot see all that well.  There is been no bleeding.  She has had no cough or shortness of breath.  She has had no leg swelling.  Overall, her performance status is ECOG 1.    Medications:  Current Outpatient Medications:  .  methadone (DOLOPHINE) 5 MG tablet, Take 5 mg by mouth every 8 (eight) hours as needed., Disp: , Rfl:  .  cyclobenzaprine (FLEXERIL) 10 MG tablet, Take 1 tablet (10 mg total) by mouth at bedtime., Disp: 30 tablet, Rfl: 5 .  diclofenac sodium (VOLTAREN) 1 % GEL, Apply 4 g topically 4 (four) times daily., Disp: 1 Tube, Rfl: 3 .  doxycycline (VIBRA-TABS) 100 MG tablet, Take 1 tablet (100 mg total) by mouth 2 (two) times daily., Disp: 20 tablet, Rfl: 0 .  DULoxetine  (CYMBALTA) 30 MG capsule, Take one tablet for 2 weeks then increase to 2 tablets daily., Disp: 60 capsule, Rfl: 1 .  furosemide (LASIX) 40 MG tablet, Take 1 tablet (40 mg total) by mouth daily. As needed for lower extremity edema., Disp: 30 tablet, Rfl: 1 .  gabapentin (NEURONTIN) 800 MG tablet, Take 800 mg by mouth 3 (three) times daily., Disp: , Rfl:  .  traZODone (DESYREL) 50 MG tablet, Take 1-2 tablets (50-100 mg total) by mouth at bedtime as needed for sleep., Disp: 30 tablet, Rfl: 0  Allergies:  Allergies  Allergen Reactions  . Morphine And Related   . Nsaids     Aleve,ibuprofen all seems to caused sharp pains in abdominal and discomfort.     Past Medical History, Surgical history, Social history, and Family History were reviewed and updated.  Review of Systems: Review of Systems  Constitutional: Positive for fatigue.  HENT:  Negative.   Eyes: Positive for eye problems.  Respiratory: Negative.   Cardiovascular: Negative.   Gastrointestinal: Negative.   Endocrine: Negative.   Genitourinary: Negative.    Musculoskeletal: Positive for back pain.  Skin: Negative.   Neurological: Negative.   Hematological: Negative.   Psychiatric/Behavioral: Negative.     Physical Exam:  weight is 156 lb (70.8 kg). Her oral temperature is 98.8  F (37.1 C). Her blood pressure is 135/96 (abnormal) and her pulse is 100. Her respiration is 18 and oxygen saturation is 100%.   Wt Readings from Last 3 Encounters:  11/06/17 156 lb (70.8 kg)  10/07/17 167 lb (75.8 kg)  09/29/17 163 lb (73.9 kg)    Physical Exam  Constitutional: She is oriented to person, place, and time.  HENT:  Head: Normocephalic and atraumatic.  Mouth/Throat: Oropharynx is clear and moist.  Eyes: Pupils are equal, round, and reactive to light. EOM are normal.  Neck: Normal range of motion.  Cardiovascular: Normal rate, regular rhythm and normal heart sounds.  Pulmonary/Chest: Effort normal and breath sounds normal.    Abdominal: Soft. Bowel sounds are normal.  Musculoskeletal: Normal range of motion. She exhibits no edema, tenderness or deformity.  Lymphadenopathy:    She has no cervical adenopathy.  Neurological: She is alert and oriented to person, place, and time.  Skin: Skin is warm and dry. No rash noted. No erythema.  Psychiatric: She has a normal mood and affect. Her behavior is normal. Judgment and thought content normal.  Vitals reviewed.    Lab Results  Component Value Date   WBC 6.0 11/06/2017   HGB 10.5 (L) 08/04/2017   HCT 46.3 11/06/2017   MCV 83.1 11/06/2017   PLT 275 11/06/2017     Chemistry      Component Value Date/Time   NA 144 09/24/2017 1403   K 3.1 (L) 09/24/2017 1403   CL 104 09/24/2017 1403   CO2 30 09/24/2017 1403   BUN 13 09/24/2017 1403   CREATININE 0.60 09/24/2017 1403   CREATININE 0.60 08/04/2017 1453      Component Value Date/Time   CALCIUM 9.1 09/24/2017 1403   ALKPHOS 113 (H) 09/24/2017 1403   AST 20 09/24/2017 1403   ALT 15 09/24/2017 1403   BILITOT 0.5 09/24/2017 1403       Impression and Plan: Sophia Smith is a 57 year old white female with iron deficiency.  We are correcting this.  She is had a very nice response.  Her hemoglobin is up almost 4 g from when we first saw her 2 months ago.  We will see what her iron studies show.  Her MCV is still a little on the lower side.  I do not think there should be any issues with her having cataract surgery.  I would like to see her back in 3 months.  I spent about 30 minutes with she and her daughter.  Over half the time was spent face-to-face.  I reviewed her labs with her.   Volanda Napoleon, MD 4/4/201912:26 PM

## 2017-11-06 NOTE — Telephone Encounter (Addendum)
Patient is aware of results.   ----- Message from Volanda Napoleon, MD sent at 11/06/2017  2:52 PM EDT ----- Call - the iron level is actually quite good!!! No need to give you any IV iron!!  Laurey Arrow

## 2017-11-10 DIAGNOSIS — M545 Low back pain: Secondary | ICD-10-CM | POA: Diagnosis not present

## 2017-11-10 DIAGNOSIS — G8929 Other chronic pain: Secondary | ICD-10-CM | POA: Diagnosis not present

## 2017-11-10 DIAGNOSIS — G894 Chronic pain syndrome: Secondary | ICD-10-CM | POA: Diagnosis not present

## 2017-11-20 DIAGNOSIS — G8929 Other chronic pain: Secondary | ICD-10-CM | POA: Diagnosis not present

## 2017-11-20 DIAGNOSIS — M545 Low back pain: Secondary | ICD-10-CM | POA: Diagnosis not present

## 2017-11-20 DIAGNOSIS — G894 Chronic pain syndrome: Secondary | ICD-10-CM | POA: Diagnosis not present

## 2017-11-25 DIAGNOSIS — H25813 Combined forms of age-related cataract, bilateral: Secondary | ICD-10-CM | POA: Diagnosis not present

## 2017-11-27 DIAGNOSIS — G8929 Other chronic pain: Secondary | ICD-10-CM | POA: Diagnosis not present

## 2017-11-27 DIAGNOSIS — G894 Chronic pain syndrome: Secondary | ICD-10-CM | POA: Diagnosis not present

## 2017-11-27 DIAGNOSIS — M545 Low back pain: Secondary | ICD-10-CM | POA: Diagnosis not present

## 2017-12-02 DIAGNOSIS — H527 Unspecified disorder of refraction: Secondary | ICD-10-CM | POA: Diagnosis not present

## 2017-12-02 DIAGNOSIS — M1712 Unilateral primary osteoarthritis, left knee: Secondary | ICD-10-CM | POA: Diagnosis not present

## 2017-12-02 DIAGNOSIS — R69 Illness, unspecified: Secondary | ICD-10-CM | POA: Diagnosis not present

## 2017-12-02 DIAGNOSIS — H25811 Combined forms of age-related cataract, right eye: Secondary | ICD-10-CM | POA: Diagnosis not present

## 2017-12-02 DIAGNOSIS — H25813 Combined forms of age-related cataract, bilateral: Secondary | ICD-10-CM | POA: Diagnosis not present

## 2017-12-02 DIAGNOSIS — Z79899 Other long term (current) drug therapy: Secondary | ICD-10-CM | POA: Diagnosis not present

## 2017-12-02 DIAGNOSIS — Z8673 Personal history of transient ischemic attack (TIA), and cerebral infarction without residual deficits: Secondary | ICD-10-CM | POA: Diagnosis not present

## 2017-12-03 DIAGNOSIS — Z961 Presence of intraocular lens: Secondary | ICD-10-CM | POA: Diagnosis not present

## 2017-12-03 DIAGNOSIS — H25812 Combined forms of age-related cataract, left eye: Secondary | ICD-10-CM | POA: Diagnosis not present

## 2017-12-03 DIAGNOSIS — H527 Unspecified disorder of refraction: Secondary | ICD-10-CM | POA: Diagnosis not present

## 2017-12-19 DIAGNOSIS — G894 Chronic pain syndrome: Secondary | ICD-10-CM | POA: Diagnosis not present

## 2017-12-30 ENCOUNTER — Encounter: Payer: Self-pay | Admitting: Physician Assistant

## 2017-12-30 DIAGNOSIS — I10 Essential (primary) hypertension: Secondary | ICD-10-CM | POA: Diagnosis not present

## 2017-12-30 DIAGNOSIS — Z8673 Personal history of transient ischemic attack (TIA), and cerebral infarction without residual deficits: Secondary | ICD-10-CM | POA: Diagnosis not present

## 2017-12-30 DIAGNOSIS — Z9841 Cataract extraction status, right eye: Secondary | ICD-10-CM | POA: Diagnosis not present

## 2017-12-30 DIAGNOSIS — M797 Fibromyalgia: Secondary | ICD-10-CM | POA: Diagnosis not present

## 2017-12-30 DIAGNOSIS — M1712 Unilateral primary osteoarthritis, left knee: Secondary | ICD-10-CM | POA: Diagnosis not present

## 2017-12-30 DIAGNOSIS — Z961 Presence of intraocular lens: Secondary | ICD-10-CM | POA: Diagnosis not present

## 2017-12-30 DIAGNOSIS — H25812 Combined forms of age-related cataract, left eye: Secondary | ICD-10-CM | POA: Diagnosis not present

## 2017-12-30 DIAGNOSIS — R69 Illness, unspecified: Secondary | ICD-10-CM | POA: Diagnosis not present

## 2017-12-30 DIAGNOSIS — Z79899 Other long term (current) drug therapy: Secondary | ICD-10-CM | POA: Diagnosis not present

## 2017-12-30 DIAGNOSIS — Z888 Allergy status to other drugs, medicaments and biological substances status: Secondary | ICD-10-CM | POA: Diagnosis not present

## 2018-01-09 DIAGNOSIS — I10 Essential (primary) hypertension: Secondary | ICD-10-CM | POA: Diagnosis not present

## 2018-01-09 DIAGNOSIS — R69 Illness, unspecified: Secondary | ICD-10-CM | POA: Diagnosis not present

## 2018-01-09 DIAGNOSIS — G43909 Migraine, unspecified, not intractable, without status migrainosus: Secondary | ICD-10-CM | POA: Diagnosis not present

## 2018-01-09 DIAGNOSIS — Z79899 Other long term (current) drug therapy: Secondary | ICD-10-CM | POA: Diagnosis not present

## 2018-01-09 DIAGNOSIS — M5416 Radiculopathy, lumbar region: Secondary | ICD-10-CM | POA: Diagnosis not present

## 2018-01-09 DIAGNOSIS — T889XXA Complication of surgical and medical care, unspecified, initial encounter: Secondary | ICD-10-CM | POA: Diagnosis not present

## 2018-01-09 DIAGNOSIS — F17210 Nicotine dependence, cigarettes, uncomplicated: Secondary | ICD-10-CM | POA: Diagnosis not present

## 2018-01-09 DIAGNOSIS — M545 Low back pain: Secondary | ICD-10-CM | POA: Diagnosis not present

## 2018-01-09 DIAGNOSIS — Z8673 Personal history of transient ischemic attack (TIA), and cerebral infarction without residual deficits: Secondary | ICD-10-CM | POA: Diagnosis not present

## 2018-01-09 DIAGNOSIS — D500 Iron deficiency anemia secondary to blood loss (chronic): Secondary | ICD-10-CM | POA: Diagnosis not present

## 2018-01-09 DIAGNOSIS — G894 Chronic pain syndrome: Secondary | ICD-10-CM | POA: Diagnosis not present

## 2018-01-19 DIAGNOSIS — Z79899 Other long term (current) drug therapy: Secondary | ICD-10-CM | POA: Diagnosis not present

## 2018-01-19 DIAGNOSIS — G894 Chronic pain syndrome: Secondary | ICD-10-CM | POA: Diagnosis not present

## 2018-01-19 DIAGNOSIS — H2512 Age-related nuclear cataract, left eye: Secondary | ICD-10-CM | POA: Diagnosis not present

## 2018-01-19 DIAGNOSIS — Z5181 Encounter for therapeutic drug level monitoring: Secondary | ICD-10-CM | POA: Diagnosis not present

## 2018-01-19 DIAGNOSIS — M545 Low back pain: Secondary | ICD-10-CM | POA: Diagnosis not present

## 2018-01-26 DIAGNOSIS — G894 Chronic pain syndrome: Secondary | ICD-10-CM | POA: Diagnosis not present

## 2018-01-26 DIAGNOSIS — M545 Low back pain: Secondary | ICD-10-CM | POA: Diagnosis not present

## 2018-01-26 DIAGNOSIS — G8929 Other chronic pain: Secondary | ICD-10-CM | POA: Diagnosis not present

## 2018-02-04 DIAGNOSIS — G894 Chronic pain syndrome: Secondary | ICD-10-CM | POA: Diagnosis not present

## 2018-02-04 DIAGNOSIS — M545 Low back pain: Secondary | ICD-10-CM | POA: Diagnosis not present

## 2018-02-09 DIAGNOSIS — M545 Low back pain: Secondary | ICD-10-CM | POA: Diagnosis not present

## 2018-02-09 DIAGNOSIS — G894 Chronic pain syndrome: Secondary | ICD-10-CM | POA: Diagnosis not present

## 2018-02-09 DIAGNOSIS — L03119 Cellulitis of unspecified part of limb: Secondary | ICD-10-CM | POA: Diagnosis not present

## 2018-02-11 ENCOUNTER — Inpatient Hospital Stay: Payer: Medicare HMO

## 2018-02-11 ENCOUNTER — Inpatient Hospital Stay: Payer: Medicare HMO | Admitting: Hematology & Oncology

## 2018-02-16 DIAGNOSIS — G894 Chronic pain syndrome: Secondary | ICD-10-CM | POA: Diagnosis not present

## 2018-02-16 DIAGNOSIS — M545 Low back pain: Secondary | ICD-10-CM | POA: Diagnosis not present

## 2018-03-03 DIAGNOSIS — G894 Chronic pain syndrome: Secondary | ICD-10-CM | POA: Diagnosis not present

## 2018-03-03 DIAGNOSIS — M545 Low back pain: Secondary | ICD-10-CM | POA: Diagnosis not present

## 2018-03-04 ENCOUNTER — Inpatient Hospital Stay: Payer: Medicare HMO | Admitting: Hematology & Oncology

## 2018-03-04 ENCOUNTER — Inpatient Hospital Stay: Payer: Medicare HMO | Attending: Hematology & Oncology

## 2018-03-09 DIAGNOSIS — Z9889 Other specified postprocedural states: Secondary | ICD-10-CM | POA: Diagnosis not present

## 2018-03-09 DIAGNOSIS — G894 Chronic pain syndrome: Secondary | ICD-10-CM | POA: Diagnosis not present

## 2018-03-09 DIAGNOSIS — M545 Low back pain: Secondary | ICD-10-CM | POA: Diagnosis not present

## 2018-03-10 ENCOUNTER — Encounter: Payer: Self-pay | Admitting: Family Medicine

## 2018-03-10 ENCOUNTER — Telehealth: Payer: Self-pay | Admitting: Family Medicine

## 2018-03-10 ENCOUNTER — Ambulatory Visit (INDEPENDENT_AMBULATORY_CARE_PROVIDER_SITE_OTHER): Payer: Medicare HMO | Admitting: Family Medicine

## 2018-03-10 VITALS — BP 130/84 | HR 84 | Temp 98.4°F | Ht 69.0 in | Wt 162.0 lb

## 2018-03-10 DIAGNOSIS — W19XXXA Unspecified fall, initial encounter: Secondary | ICD-10-CM

## 2018-03-10 DIAGNOSIS — R1012 Left upper quadrant pain: Secondary | ICD-10-CM

## 2018-03-10 DIAGNOSIS — M25561 Pain in right knee: Secondary | ICD-10-CM

## 2018-03-10 DIAGNOSIS — Z8719 Personal history of other diseases of the digestive system: Secondary | ICD-10-CM

## 2018-03-10 DIAGNOSIS — L03119 Cellulitis of unspecified part of limb: Secondary | ICD-10-CM | POA: Diagnosis not present

## 2018-03-10 LAB — BASIC METABOLIC PANEL WITH GFR
BUN: 10 mg/dL (ref 7–25)
CO2: 31 mmol/L (ref 20–32)
CREATININE: 0.62 mg/dL (ref 0.50–1.05)
Calcium: 9.1 mg/dL (ref 8.6–10.4)
Chloride: 107 mmol/L (ref 98–110)
GFR, EST AFRICAN AMERICAN: 117 mL/min/{1.73_m2} (ref >=60)
GFR, EST NON AFRICAN AMERICAN: 101 mL/min/{1.73_m2} (ref >=60)
GLUCOSE: 88 mg/dL (ref 65–99)
Potassium: 4.2 mmol/L (ref 3.5–5.3)
SODIUM: 141 mmol/L (ref 135–146)

## 2018-03-10 MED ORDER — SULFAMETHOXAZOLE-TRIMETHOPRIM 800-160 MG PO TABS: 1.0000 | ORAL_TABLET | Freq: Two times a day (BID) | ORAL | 0 refills | Status: DC

## 2018-03-10 NOTE — Progress Notes (Signed)
Subjective:    Patient ID: Sophia Smith, female    DOB: Dec 08, 1960, 57 y.o.   MRN: [id number]  HPI 57 year old female comes in today complaining of cellulitis on both lower extremities.  She is actually seen in our office about 5 months ago in March by her primary care provider for similar complaint.  She has a history of a left knee replacement, and presents today with lower extremity swelling and redness.  She was treated with doxycycline in March.  She says she did get better.  Then in early to mid June she had a spinal stimulator put in and had complications during the procedure.  She is had some persistent pain and discomfort ever since then.  She says the wound initially it drained from the spinal stimulator.  Now it is just painful warts placed.  She feels like something is just not right and that something may have happened during the procedure.  In fact she has not even use the stimulator she tried to charge it a few days ago and says that she just experienced significant burning pain.  She feels like her pain is not being adequately treated.  Since the stimulator was placed she started getting some lower extremity swelling and redness.  She was treated with 2 rounds of doxycycline and then she reports they did lab work to confirm that the infection was cleared and encouraged her to follow with her PCP if the redness and swelling continued.  She also complains of left sided abdominal pain today. Started Friday night and felt a pop.  Has been painful since.  Says worse with movement, such as walking, coughing, sneezing etc., and says it is sharp.  Pain 6/10 with movement. No change in bowels. Currently 3/10 here today.  Hx of rib fracture.   No iknown trauma or injury.  She says it actually feels somewhat similar to when she had diverticulitis about 9 or 10 years ago which ended up in a bowel perforation.  She had a partial sigmoidectomy at that time.  She also fell on Saturday night and  landed on her right knee.  She is having some pain just distal to patella.  She does have some bruising.   Review of Systems  No fevers or chills currently but when she was on the first couple rounds of doxycycline for the cellulitis she was running some low-grade temperatures.  There were no vitals taken for this visit.    Allergies  Allergen Reactions  . Morphine And Related   . Nsaids     Aleve,ibuprofen all seems to caused sharp pains in abdominal and discomfort.     Past Medical History:  Diagnosis Date  . Back disorder   . Cellulitis of left lower leg 08/04/2017  . Fibromyalgia   . Knee gives out   . Other disorders of continuity of bone, right pelvic region and thigh   . Stroke [facility])     Past Surgical History:  Procedure Laterality Date  . CHOLECYSTECTOMY    . LEG SURGERY Bilateral    numerous leg surgeries post accident age 60  . SIGMOID RESECTION / RECTOPEXY      Social History   Socioeconomic History  . Marital status: Legally Separated    Spouse name: Not on file  . Number of children: Not on file  . Years of education: Not on file  . Highest education level: Not on file  Occupational History  . Not on file  Social  Needs  . Financial resource strain: Not on file  . Food insecurity:    Worry: Not on file    Inability: Not on file  . Transportation needs:    Medical: Not on file    Non-medical: Not on file  Tobacco Use  . Smoking status: Current Every Day Smoker    Packs/day: 0.50    Types: Cigarettes    Last attempt to quit: 08/23/2013    Years since quitting: 4.5  . Smokeless tobacco: Never Used  Substance and Sexual Activity  . Alcohol use: No  . Drug use: No  . Sexual activity: Yes  Lifestyle  . Physical activity:    Days per week: Not on file    Minutes per session: Not on file  . Stress: Not on file  Relationships  . Social connections:    Talks on phone: Not on file    Gets together: Not on file    Attends religious service: Not on  file    Active member of club or organization: Not on file    Attends meetings of clubs or organizations: Not on file    Relationship status: Not on file  . Intimate partner violence:    Fear of current or ex partner: Not on file    Emotionally abused: Not on file    Physically abused: Not on file    Forced sexual activity: Not on file  Other Topics Concern  . Not on file  Social History Narrative  . Not on file    Family History  Problem Relation Age of Onset  . Heart attack Father   . Cancer Father     Outpatient Encounter Medications as of 03/10/2018  Medication Sig  . cloNIDine (CATAPRES) 0.1 MG tablet Take by mouth.  Marland Kitchen ketorolac (ACULAR) 0.5 % ophthalmic solution   . lidocaine (XYLOCAINE) 5 % ointment Apply topically.  . prednisoLONE acetate (PRED FORTE) 1 % ophthalmic suspension   . promethazine (PHENERGAN) 12.5 MG tablet Take by mouth.  . cyclobenzaprine (FLEXERIL) 10 MG tablet Take 1 tablet (10 mg total) by mouth at bedtime.  . diclofenac sodium (VOLTAREN) 1 % GEL Apply 4 g topically 4 (four) times daily.  Marland Kitchen doxycycline (VIBRA-TABS) 100 MG tablet Take 1 tablet (100 mg total) by mouth 2 (two) times daily.  . DULoxetine (CYMBALTA) 30 MG capsule Take one tablet for 2 weeks then increase to 2 tablets daily.  . furosemide (LASIX) 40 MG tablet Take 1 tablet (40 mg total) by mouth daily. As needed for lower extremity edema.  . gabapentin (NEURONTIN) 800 MG tablet Take 800 mg by mouth 3 (three) times daily.  Derrill Memo ON 03/28/2018] methadone (DOLOPHINE) 5 MG tablet Take by mouth.  . sulfamethoxazole-trimethoprim (BACTRIM DS,SEPTRA DS) 800-160 MG tablet Take 1 tablet by mouth 2 (two) times daily.  . traZODone (DESYREL) 50 MG tablet TAKE 1 TO 2 TABLETS BY MOUTH AT BEDTIME AS NEEDED FOR SLEEP.   No facility-administered encounter medications on file as of 03/10/2018.          Objective:   Physical Exam  Constitutional: She is oriented to person, place, and time. She appears  well-developed and well-nourished.  HENT:  Head: Normocephalic and atraumatic.  Eyes: Conjunctivae and EOM are normal.  Cardiovascular: Normal rate.  Pulmonary/Chest: Effort normal.  Abdominal: Soft. Normal appearance. There is tenderness.  Tender to palpation in the left upper quadrant.  No guarding.  Patient was unable to get up onto the table table and  lie flat so the exam was done sitting up.  Musculoskeletal:  Knee with some bruising just over the anterior tibia.  Nontender over the patella.  She is tender over the anterior tibia as well as the medial joint line.  Normal flexion and extension.  Neurological: She is alert and oriented to person, place, and time.  Skin: Skin is dry. No pallor.  She has 1+ pitting edema of her lower extremities going into both feet a little worse on the left compared to the right.  She has some erythema that fades is not well demarcated over most of the lower legs bilaterally but worse on the left again compared to the right.  The skin is very tender to touch.  No open or draining wounds.  She has an old incision on the right medial leg that appears to be well-healed.  Psychiatric: She has a normal mood and affect. Her behavior is normal.  Vitals reviewed.         Assessment & Plan:  Chronic venous stasis of bilateral lower extremities worse on the left compared to the right-certainly there still could be a mild component of cellulitis but I do feel like most of the infection has probably been controlled with a 2 rounds of doxycycline.  She also had some labs I do not have those to look at all reviewed today.  So we opted to go ahead and treat with 1 more round of antibiotics and will use Bactrim which will cover staph infection and see if it improves.  If not then the recommendation is for further work-up for peripheral vascular disease.  Can start with ABIs.  Also recommend compression.  She says she does have some compression stockings at home and  encouraged her to wear those during the day and take them off at night and try to elevate the legs for 10 to 15 minutes a couple times during the day.  Right knee pain status post fall-recommended further evaluation with x-ray.  Left upper quadrant pain-with her prior history of diverticulitis I do find this potentially concerning.  She is not currently having fever but reports some low-grade fevers a couple of weeks ago.  Recommend CT of the abdomen and pelvis with contrast for further evaluation.  Her pain seems to be worsened with motion which she says is how she presented initially with diverticulitis 9 to 10 years ago.  After patient was charged from the room and encouraged to go down to imaging to pick up her contrast for the stat CT patient sat in the waiting room and refused to leave until she was given pain medications.  During the office visit patient had mentioned that she had just gone to see pain management yesterday and had been going once or twice weekly for the last several weeks because of difficulty with the stimulator.  They are not wanting to remove it at this time and had recommended trigger point injections around some of the areas to help with pain.  She feels like she needs pain medication to better control her pain.  Our office manager spoke with her and let her know that we were not going to prescribe pain medications here she is currently under contract with a pain clinic.  She did want to seek referral to new pain management because of complications with the pain stimulator.  She needs to stick with her current pain management until she finds out if a new clinic would be willing to accept her  not we will not bridge her therapy here.  The [facility] registry was also reviewed and she had just filled 120 tabs of methadone on July 22 approximately 15 days ago which was a 30-day supply so she was not without pain medication.  She did not left ear fully and refused to stay to have  her CAT scan performed.

## 2018-03-10 NOTE — Progress Notes (Signed)
All labs are normal. 

## 2018-03-10 NOTE — Telephone Encounter (Signed)
FYI - Pt called clinic stating she "would not get the CT without pain meds." After review of Providers note, advised Pt no Rx would be written. Encouraged Pt to attempt the CT, as it is a fairly quick scan. Pt refused stating "only if I get pain meds." I reiterated there would be no Rx written so Pt declined to have the scan.

## 2018-03-13 ENCOUNTER — Ambulatory Visit (HOSPITAL_BASED_OUTPATIENT_CLINIC_OR_DEPARTMENT_OTHER)
Admission: RE | Admit: 2018-03-13 | Discharge: 2018-03-13 | Disposition: A | Discharge status: Home / Self Care | Payer: Medicare HMO | Source: Ambulatory Visit | Attending: Family Medicine | Admitting: Family Medicine

## 2018-03-13 DIAGNOSIS — M25561 Pain in right knee: Secondary | ICD-10-CM | POA: Insufficient documentation

## 2018-03-13 DIAGNOSIS — I700 Atherosclerosis of aorta: Secondary | ICD-10-CM | POA: Insufficient documentation

## 2018-03-13 DIAGNOSIS — K449 Diaphragmatic hernia without obstruction or gangrene: Secondary | ICD-10-CM | POA: Insufficient documentation

## 2018-03-13 DIAGNOSIS — K760 Fatty (change of) liver, not elsewhere classified: Secondary | ICD-10-CM | POA: Diagnosis not present

## 2018-03-13 DIAGNOSIS — S8991XA Unspecified injury of right lower leg, initial encounter: Secondary | ICD-10-CM | POA: Diagnosis not present

## 2018-03-13 DIAGNOSIS — R1012 Left upper quadrant pain: Secondary | ICD-10-CM | POA: Diagnosis not present

## 2018-03-13 DIAGNOSIS — Z8719 Personal history of other diseases of the digestive system: Secondary | ICD-10-CM | POA: Insufficient documentation

## 2018-03-13 MED ORDER — IOPAMIDOL (ISOVUE-300) INJECTION 61%
100.0000 mL | Freq: Once | INTRAVENOUS | Status: AC | PRN
  Administered 2018-03-13: 100 mL via INTRAVENOUS

## 2018-03-17 DIAGNOSIS — Z9889 Other specified postprocedural states: Secondary | ICD-10-CM | POA: Diagnosis not present

## 2018-03-17 DIAGNOSIS — M545 Low back pain: Secondary | ICD-10-CM | POA: Diagnosis not present

## 2018-03-17 DIAGNOSIS — G894 Chronic pain syndrome: Secondary | ICD-10-CM | POA: Diagnosis not present

## 2018-03-19 ENCOUNTER — Ambulatory Visit (INDEPENDENT_AMBULATORY_CARE_PROVIDER_SITE_OTHER): Payer: Medicare HMO | Admitting: Physician Assistant

## 2018-03-19 ENCOUNTER — Encounter: Payer: Self-pay | Admitting: Physician Assistant

## 2018-03-19 VITALS — BP 158/102 | HR 97 | Ht 69.0 in | Wt 154.0 lb

## 2018-03-19 DIAGNOSIS — M79645 Pain in left finger(s): Secondary | ICD-10-CM

## 2018-03-19 DIAGNOSIS — K449 Diaphragmatic hernia without obstruction or gangrene: Secondary | ICD-10-CM

## 2018-03-19 DIAGNOSIS — G8929 Other chronic pain: Secondary | ICD-10-CM

## 2018-03-19 DIAGNOSIS — R112 Nausea with vomiting, unspecified: Secondary | ICD-10-CM

## 2018-03-19 DIAGNOSIS — M5442 Lumbago with sciatica, left side: Secondary | ICD-10-CM | POA: Diagnosis not present

## 2018-03-19 DIAGNOSIS — M519 Unspecified thoracic, thoracolumbar and lumbosacral intervertebral disc disorder: Secondary | ICD-10-CM | POA: Diagnosis not present

## 2018-03-19 DIAGNOSIS — M4306 Spondylolysis, lumbar region: Secondary | ICD-10-CM | POA: Diagnosis not present

## 2018-03-19 DIAGNOSIS — M1711 Unilateral primary osteoarthritis, right knee: Secondary | ICD-10-CM

## 2018-03-19 DIAGNOSIS — M5441 Lumbago with sciatica, right side: Secondary | ICD-10-CM | POA: Diagnosis not present

## 2018-03-19 DIAGNOSIS — M659 Synovitis and tenosynovitis, unspecified: Secondary | ICD-10-CM | POA: Diagnosis not present

## 2018-03-19 DIAGNOSIS — R69 Illness, unspecified: Secondary | ICD-10-CM | POA: Diagnosis not present

## 2018-03-19 DIAGNOSIS — F419 Anxiety disorder, unspecified: Secondary | ICD-10-CM

## 2018-03-19 MED ORDER — BUSPIRONE HCL 7.5 MG PO TABS: 7.5000 mg | ORAL_TABLET | Freq: Three times a day (TID) | ORAL | 1 refills | Status: DC

## 2018-03-19 MED ORDER — PANTOPRAZOLE SODIUM 40 MG PO TBEC: 40.0000 mg | DELAYED_RELEASE_TABLET | Freq: Every day | ORAL | 3 refills | Status: DC

## 2018-03-19 MED ORDER — AMITRIPTYLINE HCL 50 MG PO TABS: ORAL_TABLET | ORAL | 1 refills | Status: DC

## 2018-03-19 NOTE — Progress Notes (Signed)
Subjective:    Patient ID: Sophia Smith, female    DOB: 04/17/1961, 57 y.o.   MRN: [id number]  HPI  Pt is a 57 yo female with chronic low back pain who presents to the clinic with multiple concerns.   Overall her pain is not controlled. She does have pain clinic with [place] [facility]. She has a pain stimilator and on medication but does not feel like her pain is controlled. She request another pain clinic referral.   She is having worsening pian over last 2 months with her right knee. Hx of arthritis. She has had her left knee replaced. She has not had a steroid injection in this knee. Right knee has started to given out. Pain is daily.   She is also having left thumb pain for a few weeks. Not tried anything to make better. No known trauma. Pain for the last 2-3 months.   On CT of abdomen showed large hiatal hernia. Pt continues to have a lot of problems with nausea and vomiting espeically after eating and wonders if it could be her hiatal hernia. Not on PPI. She admits to some constipation and 3-4 days between bowel movements.   Pt feels anxious with her increase in pain all over.    .. Active Ambulatory Problems    Diagnosis Date Noted  . Lymphedema of leg 09/13/2013  . Pneumothorax on left 09/13/2013  . Left rib fracture 09/13/2013  . Fibromyalgia 09/13/2013  . Lumbar disc disease 09/13/2013  . Aneurysm artery, neck ([place]) 09/15/2013  . Pelvic fracture ([place]) 09/15/2013  . Depression 09/15/2013  . Elevated alkaline phosphatase level 10/15/2013  . Bilateral lower extremity edema 10/26/2013  . Left knee pain 10/26/2013  . Tricompartment degenerative joint disease of knee 10/26/2013  . Plantar fasciitis, bilateral 03/21/2014  . S/P total knee replacement 09/13/2014  . Left ankle pain 09/13/2014  . Insomnia secondary to chronic pain 06/01/2015  . Upper back pain on right side 06/01/2015  . Internal hemorrhoid 12/05/2015  . Iron deficiency anemia 05/13/2017   . Drug-seeking behavior 09/19/2017  . Uncontrolled stage 2 hypertension 09/19/2017  . Opioid dependence with withdrawal ([place]) 09/19/2017  . Acute exacerbation of chronic low back pain 09/19/2017  . Cigarette nicotine dependence without complication 93/26/7124  . Lumbar spondylolysis 09/23/2017  . Mitral regurgitation 10/22/2017  . Tricuspid regurgitation 10/22/2017  . Left atrial enlargement 10/22/2017  . Bilateral cataracts 11/04/2017  . Disorder of refraction 11/04/2017  . Thumb pain, left 03/22/2018  . Extensor carpi radialis brevis tenosynovitis 03/22/2018  . Non-intractable vomiting with nausea 03/22/2018  . Hiatal hernia 03/22/2018   Resolved Ambulatory Problems    Diagnosis Date Noted  . Acute stress reaction 06/01/2015  . Seroma, post-traumatic ([place]) 05/13/2017  . Cellulitis of left lower leg 08/04/2017  . Wound of left leg 08/04/2017   Past Medical History:  Diagnosis Date  . Back disorder   . Knee gives out   . Other disorders of continuity of bone, right pelvic region and thigh   . Stroke [facility])      Review of Systems    see HPI.  Objective:   Physical Exam  Constitutional: She is oriented to person, place, and time. She appears well-developed and well-nourished.  HENT:  Head: Normocephalic and atraumatic.  Neck: Normal range of motion. Neck supple.  Cardiovascular: Normal rate and regular rhythm.  Pulmonary/Chest: Effort normal and breath sounds normal.  Musculoskeletal:  Tenderness over the extensor brevis tendon. Positive  finkelstein.  4/5 hand grip.  Thumb weak.   Right knee tenderness to palpitation of medial joint space. Mild effusion.   Neurological: She is alert and oriented to person, place, and time.  Psychiatric: She has a normal mood and affect. Her behavior is normal.          Assessment & Plan:  Marland KitchenMarland KitchenDiagnoses and all orders for this visit:  Thumb pain, left  Extensor carpi radialis brevis tenosynovitis  Hiatal hernia -      pantoprazole (PROTONIX) 40 MG tablet; Take 1 tablet (40 mg total) by mouth daily.  Non-intractable vomiting with nausea, unspecified vomiting type -     pantoprazole (PROTONIX) 40 MG tablet; Take 1 tablet (40 mg total) by mouth daily.  Tricompartment osteoarthritis of right knee  Lumbar disc disease -     amitriptyline (ELAVIL) 50 MG tablet; One half tab PO qHS for a week, then one tab PO qHS.  Lumbar spondylolysis -     amitriptyline (ELAVIL) 50 MG tablet; One half tab PO qHS for a week, then one tab PO qHS.  Chronic bilateral low back pain with bilateral sciatica -     amitriptyline (ELAVIL) 50 MG tablet; One half tab PO qHS for a week, then one tab PO qHS.  Anxiety -     busPIRone (BUSPAR) 7.5 MG tablet; Take 1 tablet (7.5 mg total) by mouth 3 (three) times daily.  will refer to new pain clinic at patients request. Added PPI, prontonix. Follow up as needed.   For pain added elavil. For anxiety buspar.  Will avoid any other controlled substances due to being on pain contract.   Injection Procedure Note  Pre-operative Diagnosis: right knee arthritis/pain  Post-operative Diagnosis: same  Indications: pain relief.   Procedure Details   Verbal consent was obtained for the procedure. The joint was prepped with Betadine and a small wheel of anesthetic was injected into the subcutaneous tissue. A 22 gauge needle was inserted into the medial joint space.  9 ml 1% lidocaine and 1 ml of depo medrol 40mg  was then injected into the joint through the same needle. The needle was removed and the area cleansed and dressed.  Complications:  None; patient tolerated the procedure well.  Marland KitchenSpent 40 minutes with patient and greater than 50 percent of visit spent counseling patient regarding treatment plan.

## 2018-03-19 NOTE — Patient Instructions (Signed)
Will make referral to: ortho, pain clinic, gastroenterology

## 2018-03-22 DIAGNOSIS — M79645 Pain in left finger(s): Secondary | ICD-10-CM | POA: Insufficient documentation

## 2018-03-22 DIAGNOSIS — R112 Nausea with vomiting, unspecified: Secondary | ICD-10-CM | POA: Insufficient documentation

## 2018-03-22 DIAGNOSIS — K449 Diaphragmatic hernia without obstruction or gangrene: Secondary | ICD-10-CM | POA: Insufficient documentation

## 2018-03-22 DIAGNOSIS — M65939 Unspecified synovitis and tenosynovitis, unspecified forearm: Secondary | ICD-10-CM | POA: Insufficient documentation

## 2018-03-22 DIAGNOSIS — M659 Synovitis and tenosynovitis, unspecified: Secondary | ICD-10-CM | POA: Insufficient documentation

## 2018-03-26 ENCOUNTER — Encounter (INDEPENDENT_AMBULATORY_CARE_PROVIDER_SITE_OTHER): Payer: Self-pay | Admitting: Orthopaedic Surgery

## 2018-03-26 ENCOUNTER — Other Ambulatory Visit (INDEPENDENT_AMBULATORY_CARE_PROVIDER_SITE_OTHER): Payer: Self-pay | Admitting: Radiology

## 2018-03-26 ENCOUNTER — Ambulatory Visit (INDEPENDENT_AMBULATORY_CARE_PROVIDER_SITE_OTHER): Payer: Medicare HMO | Admitting: Orthopaedic Surgery

## 2018-03-26 VITALS — BP 137/88 | HR 84 | Ht 69.0 in | Wt 155.0 lb

## 2018-03-26 DIAGNOSIS — M25561 Pain in right knee: Secondary | ICD-10-CM

## 2018-03-26 DIAGNOSIS — G8929 Other chronic pain: Secondary | ICD-10-CM | POA: Diagnosis not present

## 2018-03-26 NOTE — Progress Notes (Signed)
Office Visit Note   Patient: Sophia Smith           Date of Birth: 09/25/1960           MRN: [id number] Visit Date: 03/26/2018              Requested by: Donella Stade, PA-C [address] [place], [place] [address] PCP: Donella Stade, PA-C   Assessment & Plan: Visit Diagnoses:  1. Chronic pain of right knee     Plan: Chronic right knee pain.  Will order MRI scan as she is been unresponsive the time cortisone injection.  X-rays revealed mild to moderate arthritis.  Concern that there is pathology other than the arthritis causing her pain  Follow-Up Instructions: Return after MRI right knee.   Orders:  No orders of the defined types were placed in this encounter.  No orders of the defined types were placed in this encounter.     Procedures: No procedures performed   Clinical Data: No additional findings.   Subjective: Chief Complaint  Patient presents with  . New Patient (Initial Visit)    chronic right knee pain for 4-5 yrs, r knee pain gotten worse last 1-2 mo l knee surgery 5-6 yrs ago. seen pa jade breeback about 1 week ago had injection and was referred here. injection only worked for 1 day  Sophia Smith is 57 years old visited the office for evaluation of chronic right knee pain.  Pain was insidious in onset without history of injury or trauma.  She is been unable to take NSAIDs as it causes stomach upset.  She was recently seen by her primary care physician and given a shot of cortisone provided little if any relief.  Her knee will swell to be achy and sore.  It obviously interferes with her activities of daily living.  She has had a prior left total knee replacement performed in [place] within the past 10 years. I reviewed her films on the PACS system.  There is some decreased bony mineralization.  There is a decrease in the medial joint space with some irregularity along the femoral tibial joint surfaces consistent with arthritis.   Very minimal osteophyte formation.  Minimal degenerative change laterally and mild to moderate changes at the patellofemoral joint  HPI  Review of Systems  Constitutional: Negative for fatigue and fever.  HENT: Negative for ear pain.   Eyes: Negative for pain.  Respiratory: Negative for cough and shortness of breath.   Cardiovascular: Negative for leg swelling.  Gastrointestinal: Negative for constipation and diarrhea.  Genitourinary: Negative for difficulty urinating.  Musculoskeletal: Positive for back pain. Negative for neck pain.  Skin: Negative for rash.  Allergic/Immunologic: Negative for food allergies.  Neurological: Positive for weakness. Negative for numbness.  Hematological: Bruises/bleeds easily.  Psychiatric/Behavioral: Positive for sleep disturbance.     Objective: Vital Signs: BP 137/88 (BP Location: Left Arm, Patient Position: Sitting, Cuff Size: Normal)   Pulse 84   Ht 5\' 9"  (1.753 m)   Wt 155 lb (70.3 kg)   BMI 22.89 kg/m   Physical Exam  Constitutional: She is oriented to person, place, and time. She appears well-developed and well-nourished.  HENT:  Mouth/Throat: Oropharynx is clear and moist.  Eyes: Pupils are equal, round, and reactive to light. EOM are normal.  Pulmonary/Chest: Effort normal.  Neurological: She is alert and oriented to person, place, and time.  Skin: Skin is warm and dry.  Psychiatric: She has  a normal mood and affect. Her behavior is normal.    Ortho Exam awake alert and oriented x3.  Comfortable sitting.  On examination of the right knee there was no obvious effusion.  No instability.  Alignment appeared to be neutral.  Medial more than lateral joint pain.  Some patellar crepitation.  No popliteal discomfort or mass.  No calf pain.  Motor exam intact.  Straight leg raise negative.  Painless range of motion right hip  Specialty Comments:  No specialty comments available.  Imaging: No results found.   PMFS History: Patient  Active Problem List   Diagnosis Date Noted  . Thumb pain, left 03/22/2018  . Extensor carpi radialis brevis tenosynovitis 03/22/2018  . Non-intractable vomiting with nausea 03/22/2018  . Hiatal hernia 03/22/2018  . Bilateral cataracts 11/04/2017  . Disorder of refraction 11/04/2017  . Mitral regurgitation 10/22/2017  . Tricuspid regurgitation 10/22/2017  . Left atrial enlargement 10/22/2017  . Lumbar spondylolysis 09/23/2017  . Drug-seeking behavior 09/19/2017  . Uncontrolled stage 2 hypertension 09/19/2017  . Opioid dependence with withdrawal ([place]) 09/19/2017  . Acute exacerbation of chronic low back pain 09/19/2017  . Cigarette nicotine dependence without complication 66/44/0347  . Iron deficiency anemia 05/13/2017  . Internal hemorrhoid 12/05/2015  . Insomnia secondary to chronic pain 06/01/2015  . Upper back pain on right side 06/01/2015  . S/P total knee replacement 09/13/2014  . Left ankle pain 09/13/2014  . Plantar fasciitis, bilateral 03/21/2014  . Bilateral lower extremity edema 10/26/2013  . Left knee pain 10/26/2013  . Tricompartment degenerative joint disease of knee 10/26/2013  . Elevated alkaline phosphatase level 10/15/2013  . Aneurysm artery, neck ([place]) 09/15/2013  . Pelvic fracture ([place]) 09/15/2013  . Depression 09/15/2013  . Lymphedema of leg 09/13/2013  . Pneumothorax on left 09/13/2013  . Left rib fracture 09/13/2013  . Fibromyalgia 09/13/2013  . Lumbar disc disease 09/13/2013   Past Medical History:  Diagnosis Date  . Back disorder   . Cellulitis of left lower leg 08/04/2017  . Fibromyalgia   . Knee gives out   . Other disorders of continuity of bone, right pelvic region and thigh   . Stroke [facility])     Family History  Problem Relation Age of Onset  . Heart attack Father   . Cancer Father     Past Surgical History:  Procedure Laterality Date  . CHOLECYSTECTOMY    . LEG SURGERY Bilateral    numerous leg surgeries post accident age 6  . SIGMOID  RESECTION / RECTOPEXY     Social History   Occupational History  . Not on file  Tobacco Use  . Smoking status: Current Every Day Smoker    Packs/day: 0.50    Types: Cigarettes    Last attempt to quit: 08/23/2013    Years since quitting: 4.5  . Smokeless tobacco: Never Used  Substance and Sexual Activity  . Alcohol use: No  . Drug use: No  . Sexual activity: Yes

## 2018-04-02 DIAGNOSIS — K5903 Drug induced constipation: Secondary | ICD-10-CM | POA: Diagnosis not present

## 2018-04-02 DIAGNOSIS — R110 Nausea: Secondary | ICD-10-CM | POA: Diagnosis not present

## 2018-04-02 DIAGNOSIS — R1012 Left upper quadrant pain: Secondary | ICD-10-CM | POA: Diagnosis not present

## 2018-04-02 DIAGNOSIS — R1310 Dysphagia, unspecified: Secondary | ICD-10-CM | POA: Diagnosis not present

## 2018-04-07 ENCOUNTER — Ambulatory Visit: Payer: Medicare HMO

## 2018-04-07 DIAGNOSIS — R1012 Left upper quadrant pain: Secondary | ICD-10-CM | POA: Diagnosis not present

## 2018-04-08 ENCOUNTER — Telehealth: Payer: Self-pay

## 2018-04-08 NOTE — Telephone Encounter (Signed)
Sophia Smith is requesting more pain medication to get her to her next appointment at [facility]. Upcoming appointment is for 04/15/2018. Ive already called the clinic to confirm the appointment.

## 2018-04-08 NOTE — Telephone Encounter (Signed)
Last seen by pain management on 03/17/18 and new appt with clinic is 04/15/18. She should have a months worth. We cannot bridge therapy from 2 pain clinics in this office.

## 2018-04-09 DIAGNOSIS — K449 Diaphragmatic hernia without obstruction or gangrene: Secondary | ICD-10-CM | POA: Diagnosis not present

## 2018-04-09 DIAGNOSIS — R1319 Other dysphagia: Secondary | ICD-10-CM | POA: Diagnosis not present

## 2018-04-09 DIAGNOSIS — K222 Esophageal obstruction: Secondary | ICD-10-CM | POA: Diagnosis not present

## 2018-04-15 DIAGNOSIS — G8929 Other chronic pain: Secondary | ICD-10-CM | POA: Diagnosis not present

## 2018-04-15 DIAGNOSIS — Z6825 Body mass index (BMI) 25.0-25.9, adult: Secondary | ICD-10-CM | POA: Diagnosis not present

## 2018-04-15 DIAGNOSIS — M5416 Radiculopathy, lumbar region: Secondary | ICD-10-CM | POA: Diagnosis not present

## 2018-04-17 ENCOUNTER — Ambulatory Visit (INDEPENDENT_AMBULATORY_CARE_PROVIDER_SITE_OTHER): Payer: Medicare HMO | Admitting: Physician Assistant

## 2018-04-17 ENCOUNTER — Encounter: Payer: Self-pay | Admitting: Physician Assistant

## 2018-04-17 VITALS — BP 149/100 | HR 100 | Ht 69.0 in | Wt 151.0 lb

## 2018-04-17 DIAGNOSIS — W19XXXA Unspecified fall, initial encounter: Secondary | ICD-10-CM

## 2018-04-17 DIAGNOSIS — G894 Chronic pain syndrome: Secondary | ICD-10-CM | POA: Diagnosis not present

## 2018-04-17 DIAGNOSIS — M519 Unspecified thoracic, thoracolumbar and lumbosacral intervertebral disc disorder: Secondary | ICD-10-CM | POA: Diagnosis not present

## 2018-04-17 DIAGNOSIS — M4306 Spondylolysis, lumbar region: Secondary | ICD-10-CM

## 2018-04-17 MED ORDER — PREDNISONE 50 MG PO TABS: ORAL_TABLET | ORAL | 0 refills | Status: DC

## 2018-04-17 MED ORDER — KETOROLAC TROMETHAMINE 60 MG/2ML IM SOLN
60.0000 mg | Freq: Once | INTRAMUSCULAR | Status: AC
  Administered 2018-04-17: 60 mg via INTRAMUSCULAR

## 2018-04-17 NOTE — Patient Instructions (Signed)
Consider knee sleeve.  Prednisone burst sent to pharmacy.

## 2018-04-17 NOTE — Progress Notes (Signed)
Subjective:    Patient ID: Sophia Smith, female    DOB: October 25, 1960, 57 y.o.   MRN: [id number]  HPI  Patient is a 57 year old female with a long history of chronic pain due to lumbar degenerative disc disease and spondylosis who presents to the clinic to follow-up after a fall.  She was recently released by [facility] for being a noncompliant patient.  She reports that she tried the procedure such as trigger point injections and stimulator but they have not been very beneficial.  She actually will be getting her pain stimulator out on the 18th.  She has seen a new pain clinic with [facility] however they would not prescribe her pain medication until he got all of her records.  This has left her without any pain medication.  She did fall last Thursday when her right knee gave out.  She reports pain 10 out of 10.  She is requesting oxycodone.  Her anxiety has been some better with adding elavil and buspar. She is happy with those changes.   .. Active Ambulatory Problems    Diagnosis Date Noted  . Lymphedema of leg 09/13/2013  . Pneumothorax on left 09/13/2013  . Left rib fracture 09/13/2013  . Fibromyalgia 09/13/2013  . Lumbar disc disease 09/13/2013  . Aneurysm artery, neck ([place]) 09/15/2013  . Pelvic fracture ([place]) 09/15/2013  . Depression 09/15/2013  . Elevated alkaline phosphatase level 10/15/2013  . Bilateral lower extremity edema 10/26/2013  . Left knee pain 10/26/2013  . Tricompartment degenerative joint disease of knee 10/26/2013  . Plantar fasciitis, bilateral 03/21/2014  . S/P total knee replacement 09/13/2014  . Left ankle pain 09/13/2014  . Insomnia secondary to chronic pain 06/01/2015  . Upper back pain on right side 06/01/2015  . Internal hemorrhoid 12/05/2015  . Iron deficiency anemia 05/13/2017  . Drug-seeking behavior 09/19/2017  . Uncontrolled stage 2 hypertension 09/19/2017  . Opioid dependence with withdrawal ([place]) 09/19/2017  . Acute exacerbation  of chronic low back pain 09/19/2017  . Cigarette nicotine dependence without complication 86/75/4492  . Lumbar spondylolysis 09/23/2017  . Mitral regurgitation 10/22/2017  . Tricuspid regurgitation 10/22/2017  . Left atrial enlargement 10/22/2017  . Bilateral cataracts 11/04/2017  . Disorder of refraction 11/04/2017  . Thumb pain, left 03/22/2018  . Extensor carpi radialis brevis tenosynovitis 03/22/2018  . Non-intractable vomiting with nausea 03/22/2018  . Hiatal hernia 03/22/2018   Resolved Ambulatory Problems    Diagnosis Date Noted  . Acute stress reaction 06/01/2015  . Seroma, post-traumatic ([place]) 05/13/2017  . Cellulitis of left lower leg 08/04/2017  . Wound of left leg 08/04/2017   Past Medical History:  Diagnosis Date  . Back disorder   . Knee gives out   . Other disorders of continuity of bone, right pelvic region and thigh   . Stroke [facility])       Review of Systems  All other systems reviewed and are negative.      Objective:   Physical Exam  Constitutional: She is oriented to person, place, and time. She appears well-developed and well-nourished.  HENT:  Head: Normocephalic and atraumatic.  Cardiovascular: Normal rate and regular rhythm.  Pulmonary/Chest: Effort normal.  Neurological: She is alert and oriented to person, place, and time.  Psychiatric: She has a normal mood and affect. Her behavior is normal.          Assessment & Plan:  Marland KitchenMarland KitchenDiagnoses and all orders for this visit:  Lumbar disc disease -  predniSONE (DELTASONE) 50 MG tablet; Take one tablet for 5 days. -     ketorolac (TORADOL) injection 60 mg  Lumbar spondylolysis -     predniSONE (DELTASONE) 50 MG tablet; Take one tablet for 5 days. -     ketorolac (TORADOL) injection 60 mg  Fall, initial encounter -     predniSONE (DELTASONE) 50 MG tablet; Take one tablet for 5 days. -     ketorolac (TORADOL) injection 60 mg  Chronic pain syndrome   Discussed with patient since she is  under contract was under contract with [place] [facility] and she has seen another pain clinic that she just recently saw. I think it would be a break of contract to prescribe pain medications. I recently did bridge until she got in with new pain clinic and now being asked to bridge until pain clinic will prescribe medications.   I did discuss this with my supervising physician who agrees since we have recently bridged her until she could get to the new pain clinic.  She is very aware that we would not be prescribing medications after that appointment.  Unfortunately she was not able to provide them with medical records and thus they did not give her any medication.  We did go ahead and give her some Toradol as well as a burst of prednisone to see if we could help with her pain symptoms today.  Encouraged her to go pick up her old medical records to make an appointment with new pain clinic to get back on medication as needed for pain control. Discussed knee sleeve to strength knee and avoid falls.   Anxiety seems to be a little better. Continue with therapy.

## 2018-04-20 ENCOUNTER — Encounter: Payer: Self-pay | Admitting: Physician Assistant

## 2018-04-22 DIAGNOSIS — Z4549 Encounter for adjustment and management of other implanted nervous system device: Secondary | ICD-10-CM | POA: Diagnosis not present

## 2018-04-22 DIAGNOSIS — R69 Illness, unspecified: Secondary | ICD-10-CM | POA: Diagnosis not present

## 2018-04-22 DIAGNOSIS — K219 Gastro-esophageal reflux disease without esophagitis: Secondary | ICD-10-CM | POA: Diagnosis not present

## 2018-04-22 DIAGNOSIS — Y838 Other surgical procedures as the cause of abnormal reaction of the patient, or of later complication, without mention of misadventure at the time of the procedure: Secondary | ICD-10-CM | POA: Diagnosis not present

## 2018-04-22 DIAGNOSIS — T85118D Breakdown (mechanical) of other implanted electronic stimulator of nervous system, subsequent encounter: Secondary | ICD-10-CM | POA: Diagnosis not present

## 2018-04-22 DIAGNOSIS — Z79899 Other long term (current) drug therapy: Secondary | ICD-10-CM | POA: Diagnosis not present

## 2018-04-22 DIAGNOSIS — T85192A Other mechanical complication of implanted electronic neurostimulator (electrode) of spinal cord, initial encounter: Secondary | ICD-10-CM | POA: Diagnosis not present

## 2018-04-22 DIAGNOSIS — Z8673 Personal history of transient ischemic attack (TIA), and cerebral infarction without residual deficits: Secondary | ICD-10-CM | POA: Diagnosis not present

## 2018-04-22 DIAGNOSIS — D500 Iron deficiency anemia secondary to blood loss (chronic): Secondary | ICD-10-CM | POA: Diagnosis not present

## 2018-04-22 DIAGNOSIS — Z4542 Encounter for adjustment and management of neuropacemaker (brain) (peripheral nerve) (spinal cord): Secondary | ICD-10-CM | POA: Diagnosis not present

## 2018-04-22 DIAGNOSIS — T85840A Pain due to nervous system prosthetic devices, implants and grafts, initial encounter: Secondary | ICD-10-CM | POA: Diagnosis not present

## 2018-04-27 ENCOUNTER — Ambulatory Visit (INDEPENDENT_AMBULATORY_CARE_PROVIDER_SITE_OTHER): Payer: Medicare HMO

## 2018-04-27 DIAGNOSIS — M23251 Derangement of posterior horn of lateral meniscus due to old tear or injury, right knee: Secondary | ICD-10-CM | POA: Diagnosis not present

## 2018-04-27 DIAGNOSIS — M25561 Pain in right knee: Secondary | ICD-10-CM | POA: Diagnosis not present

## 2018-05-01 DIAGNOSIS — M5416 Radiculopathy, lumbar region: Secondary | ICD-10-CM | POA: Diagnosis not present

## 2018-05-01 DIAGNOSIS — Z79899 Other long term (current) drug therapy: Secondary | ICD-10-CM | POA: Diagnosis not present

## 2018-05-01 DIAGNOSIS — Z79891 Long term (current) use of opiate analgesic: Secondary | ICD-10-CM | POA: Diagnosis not present

## 2018-05-01 DIAGNOSIS — G8929 Other chronic pain: Secondary | ICD-10-CM | POA: Diagnosis not present

## 2018-05-07 ENCOUNTER — Telehealth (INDEPENDENT_AMBULATORY_CARE_PROVIDER_SITE_OTHER): Payer: Self-pay | Admitting: *Deleted

## 2018-05-07 ENCOUNTER — Ambulatory Visit (INDEPENDENT_AMBULATORY_CARE_PROVIDER_SITE_OTHER): Payer: Medicare HMO | Admitting: Orthopaedic Surgery

## 2018-05-07 ENCOUNTER — Encounter (INDEPENDENT_AMBULATORY_CARE_PROVIDER_SITE_OTHER): Payer: Self-pay | Admitting: Orthopaedic Surgery

## 2018-05-07 VITALS — BP 148/92 | HR 88 | Ht 69.0 in | Wt 152.0 lb

## 2018-05-07 DIAGNOSIS — M25561 Pain in right knee: Secondary | ICD-10-CM | POA: Diagnosis not present

## 2018-05-07 DIAGNOSIS — G8929 Other chronic pain: Secondary | ICD-10-CM | POA: Diagnosis not present

## 2018-05-07 NOTE — Progress Notes (Signed)
Office Visit Note   Patient: Sophia Smith           Date of Birth: [birth year]           MRN: [id number] Visit Date: 05/07/2018              Requested by: Donella Stade, PA-C [address] [place], [place] [id number] will her insurance carrier regarding the Visco PCP: Donella Stade, PA-C   Assessment & Plan: Visit Diagnoses:  1. Chronic pain of right knee     Plan: MRI scan of right knee demonstrates tricompartmental cartilage abnormalities typically about the patellofemoral joint and medially.  There is a small radial tear of the free edge of the body of the lateral meniscus.  I do not think that symptomatic.  Is a compared to the MRI scan to her clinical exam I think the biggest problem is with her arthritis but typically that about the patellofemoral joint long discussion regarding treatment options including exercises, over-the-counter medicines, Voltaren gel, cortisone injections and even Visco supplementation.  We will check with the insurance carrier regarding Visco supplementation  Follow-Up Instructions: Return if symptoms worsen or fail to improve.   Orders:  No orders of the defined types were placed in this encounter.  No orders of the defined types were placed in this encounter.     Procedures: No procedures performed   Clinical Data: No additional findings.   Subjective: Chief Complaint  Patient presents with  . Follow-up    MRI REVIEW RIGH TKNEE    HPI  Review of Systems  Constitutional: Negative for fatigue and fever.  HENT: Negative for ear pain.   Eyes: Negative for pain.  Respiratory: Negative for cough and shortness of breath.   Cardiovascular: Negative for leg swelling.  Gastrointestinal: Negative for constipation and diarrhea.  Genitourinary: Negative for difficulty urinating.  Musculoskeletal: Positive for back pain. Negative for neck pain.  Skin: Negative for rash.  Allergic/Immunologic: Negative for food  allergies.  Neurological: Positive for weakness. Negative for numbness.  Hematological: Bruises/bleeds easily.  Psychiatric/Behavioral: Positive for sleep disturbance.     Objective: Vital Signs: BP (!) 148/92 (BP Location: Left Arm, Patient Position: Sitting, Cuff Size: Normal)   Pulse 88   Ht 5\' 9"  (1.753 m)   Wt 152 lb (68.9 kg)   BMI 22.45 kg/m   Physical Exam  Constitutional: She is oriented to person, place, and time. She appears well-developed and well-nourished.  HENT:  Mouth/Throat: Oropharynx is clear and moist.  Eyes: Pupils are equal, round, and reactive to light. EOM are normal.  Pulmonary/Chest: Effort normal.  Neurological: She is alert and oriented to person, place, and time.  Skin: Skin is warm and dry.  Psychiatric: She has a normal mood and affect. Her behavior is normal.    Ortho Exam awake alert and oriented x3.  Comfortable sitting.  Examination of the right knee there was no effusion.  The majority of the pain seems to be about the patella and more medial than lateral.  There was some patellar crepitation Specialty Comments:  No specialty comments available.  Imaging: No results found.   PMFS History: Patient Active Problem List   Diagnosis Date Noted  . Thumb pain, left 03/22/2018  . Extensor carpi radialis brevis tenosynovitis 03/22/2018  . Non-intractable vomiting with nausea 03/22/2018  . Hiatal hernia 03/22/2018  . Bilateral cataracts 11/04/2017  . Disorder of refraction 11/04/2017  . Mitral regurgitation 10/22/2017  . Tricuspid  regurgitation 10/22/2017  . Left atrial enlargement 10/22/2017  . Lumbar spondylolysis 09/23/2017  . Drug-seeking behavior 09/19/2017  . Uncontrolled stage 2 hypertension 09/19/2017  . Opioid dependence with withdrawal ([place]) 09/19/2017  . Acute exacerbation of chronic low back pain 09/19/2017  . Cigarette nicotine dependence without complication 37/11/8887  . Iron deficiency anemia 05/13/2017  . Internal  hemorrhoid 12/05/2015  . Insomnia secondary to chronic pain 06/01/2015  . Upper back pain on right side 06/01/2015  . S/P total knee replacement 09/13/2014  . Left ankle pain 09/13/2014  . Plantar fasciitis, bilateral 03/21/2014  . Bilateral lower extremity edema 10/26/2013  . Left knee pain 10/26/2013  . Tricompartment degenerative joint disease of knee 10/26/2013  . Elevated alkaline phosphatase level 10/15/2013  . Aneurysm artery, neck ([place]) 09/15/2013  . Pelvic fracture ([place]) 09/15/2013  . Depression 09/15/2013  . Lymphedema of leg 09/13/2013  . Pneumothorax on left 09/13/2013  . Left rib fracture 09/13/2013  . Fibromyalgia 09/13/2013  . Lumbar disc disease 09/13/2013   Past Medical History:  Diagnosis Date  . Back disorder   . Cellulitis of left lower leg 08/04/2017  . Fibromyalgia   . Knee gives out   . Other disorders of continuity of bone, right pelvic region and thigh   . Stroke [facility])     Family History  Problem Relation Age of Onset  . Heart attack Father   . Cancer Father     Past Surgical History:  Procedure Laterality Date  . CHOLECYSTECTOMY    . LEG SURGERY Bilateral    numerous leg surgeries post accident age 66  . SIGMOID RESECTION / RECTOPEXY     Social History   Occupational History  . Not on file  Tobacco Use  . Smoking status: Current Every Day Smoker    Packs/day: 0.50    Types: Cigarettes    Last attempt to quit: 08/23/2013    Years since quitting: 4.7  . Smokeless tobacco: Never Used  Substance and Sexual Activity  . Alcohol use: No  . Drug use: No  . Sexual activity: Yes

## 2018-05-07 NOTE — Telephone Encounter (Signed)
Please apply for visco for right knee for Dr. Durward Fortes patient. Thank you.

## 2018-05-12 NOTE — Telephone Encounter (Signed)
Noted  

## 2018-05-13 ENCOUNTER — Telehealth (INDEPENDENT_AMBULATORY_CARE_PROVIDER_SITE_OTHER): Payer: Self-pay

## 2018-05-13 ENCOUNTER — Ambulatory Visit (INDEPENDENT_AMBULATORY_CARE_PROVIDER_SITE_OTHER): Payer: Medicare HMO | Admitting: Physician Assistant

## 2018-05-13 ENCOUNTER — Encounter: Payer: Self-pay | Admitting: Physician Assistant

## 2018-05-13 VITALS — BP 137/91 | HR 85 | Ht 69.0 in | Wt 166.0 lb

## 2018-05-13 DIAGNOSIS — I878 Other specified disorders of veins: Secondary | ICD-10-CM | POA: Diagnosis not present

## 2018-05-13 DIAGNOSIS — L03115 Cellulitis of right lower limb: Secondary | ICD-10-CM | POA: Diagnosis not present

## 2018-05-13 DIAGNOSIS — G8929 Other chronic pain: Secondary | ICD-10-CM

## 2018-05-13 DIAGNOSIS — M5441 Lumbago with sciatica, right side: Secondary | ICD-10-CM | POA: Diagnosis not present

## 2018-05-13 DIAGNOSIS — R69 Illness, unspecified: Secondary | ICD-10-CM | POA: Diagnosis not present

## 2018-05-13 DIAGNOSIS — M5442 Lumbago with sciatica, left side: Secondary | ICD-10-CM | POA: Diagnosis not present

## 2018-05-13 DIAGNOSIS — M4306 Spondylolysis, lumbar region: Secondary | ICD-10-CM | POA: Diagnosis not present

## 2018-05-13 DIAGNOSIS — M797 Fibromyalgia: Secondary | ICD-10-CM

## 2018-05-13 DIAGNOSIS — F3342 Major depressive disorder, recurrent, in full remission: Secondary | ICD-10-CM

## 2018-05-13 DIAGNOSIS — L03116 Cellulitis of left lower limb: Secondary | ICD-10-CM

## 2018-05-13 DIAGNOSIS — M519 Unspecified thoracic, thoracolumbar and lumbosacral intervertebral disc disorder: Secondary | ICD-10-CM | POA: Diagnosis not present

## 2018-05-13 MED ORDER — DOXYCYCLINE HYCLATE 100 MG PO TABS: 100.0000 mg | ORAL_TABLET | Freq: Two times a day (BID) | ORAL | 0 refills | Status: DC

## 2018-05-13 MED ORDER — AMITRIPTYLINE HCL 50 MG PO TABS: ORAL_TABLET | ORAL | 5 refills | Status: DC

## 2018-05-13 MED ORDER — DULOXETINE HCL 60 MG PO CPEP: 60.0000 mg | ORAL_CAPSULE | Freq: Two times a day (BID) | ORAL | 5 refills | Status: DC

## 2018-05-13 NOTE — Telephone Encounter (Signed)
Submitted VOB for Orthovisc, right knee. 

## 2018-05-13 NOTE — Patient Instructions (Signed)

## 2018-05-13 NOTE — Progress Notes (Signed)
Subjective:    Patient ID: Sophia Smith, female    DOB: 08-05-1961, 57 y.o.   MRN: [id number]  HPI  Pt is a 57 yo female with hx of recurrent leg cellutlitis, chronic back pain, venous stasis who presents to the clinic with bilateral red, tender swollen legs. She has not been wearing compression stockings. She denies any fever or chills. She does not take lasix daily only when needed. She noticed the legs yesterday. She is scared because she has had to go to ER and be admitted for cellulitis before. She denies any sOB, cough, wheezing.   She does need a few medications while in between pain clinics.   Overall she is doing ok. Still in chronic daily pain.   .. Active Ambulatory Problems    Diagnosis Date Noted  . Lymphedema of leg 09/13/2013  . Pneumothorax on left 09/13/2013  . Left rib fracture 09/13/2013  . Fibromyalgia 09/13/2013  . Lumbar disc disease 09/13/2013  . Aneurysm artery, neck ([place]) 09/15/2013  . Pelvic fracture ([place]) 09/15/2013  . Depression 09/15/2013  . Elevated alkaline phosphatase level 10/15/2013  . Bilateral lower extremity edema 10/26/2013  . Left knee pain 10/26/2013  . Tricompartment degenerative joint disease of knee 10/26/2013  . Plantar fasciitis, bilateral 03/21/2014  . S/P total knee replacement 09/13/2014  . Left ankle pain 09/13/2014  . Insomnia secondary to chronic pain 06/01/2015  . Upper back pain on right side 06/01/2015  . Internal hemorrhoid 12/05/2015  . Iron deficiency anemia 05/13/2017  . Drug-seeking behavior 09/19/2017  . Uncontrolled stage 2 hypertension 09/19/2017  . Opioid dependence with withdrawal ([place]) 09/19/2017  . Chronic bilateral low back pain with bilateral sciatica 09/19/2017  . Cigarette nicotine dependence without complication 84/69/6295  . Lumbar spondylolysis 09/23/2017  . Mitral regurgitation 10/22/2017  . Tricuspid regurgitation 10/22/2017  . Left atrial enlargement 10/22/2017  . Bilateral cataracts 11/04/2017   . Disorder of refraction 11/04/2017  . Thumb pain, left 03/22/2018  . Extensor carpi radialis brevis tenosynovitis 03/22/2018  . Non-intractable vomiting with nausea 03/22/2018  . Hiatal hernia 03/22/2018  . Chronic venous stasis 05/14/2018   Resolved Ambulatory Problems    Diagnosis Date Noted  . Acute stress reaction 06/01/2015  . Seroma, post-traumatic ([place]) 05/13/2017  . Cellulitis of left lower leg 08/04/2017  . Wound of left leg 08/04/2017   Past Medical History:  Diagnosis Date  . Back disorder   . Knee gives out   . Other disorders of continuity of bone, right pelvic region and thigh   . Stroke [facility])     Review of Systems See HPI.     Objective:   Physical Exam  Constitutional: She is oriented to person, place, and time. She appears well-developed and well-nourished.  HENT:  Head: Normocephalic and atraumatic.  Cardiovascular: Normal rate and regular rhythm.  Murmur heard. Pulmonary/Chest: Effort normal and breath sounds normal.  Neurological: She is alert and oriented to person, place, and time.  Skin:  Bilateral erythematous feet and lower leg, warm to touch, very tender, 1 plus pitting edema. Left worse than right.   Psychiatric: She has a normal mood and affect. Her behavior is normal.          Assessment & Plan:  Marland KitchenMarland KitchenDiagnoses and all orders for this visit:  Bilateral lower leg cellulitis -     doxycycline (VIBRA-TABS) 100 MG tablet; Take 1 tablet (100 mg total) by mouth 2 (two) times daily.  Recurrent major depressive disorder, in full remission (  HCC) -     amitriptyline (ELAVIL) 50 MG tablet; One half tab PO qHS for a week, then one tab PO qHS. -     DULoxetine (CYMBALTA) 60 MG capsule; Take 1 capsule (60 mg total) by mouth 2 (two) times daily.  Fibromyalgia -     amitriptyline (ELAVIL) 50 MG tablet; One half tab PO qHS for a week, then one tab PO qHS. -     DULoxetine (CYMBALTA) 60 MG capsule; Take 1 capsule (60 mg total) by mouth 2 (two) times  daily.  Lumbar disc disease -     amitriptyline (ELAVIL) 50 MG tablet; One half tab PO qHS for a week, then one tab PO qHS.  Lumbar spondylolysis -     amitriptyline (ELAVIL) 50 MG tablet; One half tab PO qHS for a week, then one tab PO qHS.  Chronic bilateral low back pain with bilateral sciatica -     amitriptyline (ELAVIL) 50 MG tablet; One half tab PO qHS for a week, then one tab PO qHS.  Chronic venous stasis -     doxycycline (VIBRA-TABS) 100 MG tablet; Take 1 tablet (100 mg total) by mouth 2 (two) times daily.   Pt has hx of having to be hospitalized due to cellulitis. Started doxycycline. Discussed elevation of legs and compression. Pt encouraged to go to [facility] and get fitted for compression stockings. Increase lasix to 40mg  bid for 3 days then back down to once a day then as needed. HO given.   Refilled medications. Pt is getting re-established with pain clinic.

## 2018-05-14 ENCOUNTER — Encounter: Payer: Self-pay | Admitting: Physician Assistant

## 2018-05-14 DIAGNOSIS — I878 Other specified disorders of veins: Secondary | ICD-10-CM | POA: Insufficient documentation

## 2018-05-20 DIAGNOSIS — Z79899 Other long term (current) drug therapy: Secondary | ICD-10-CM | POA: Diagnosis not present

## 2018-05-20 DIAGNOSIS — Z5181 Encounter for therapeutic drug level monitoring: Secondary | ICD-10-CM | POA: Diagnosis not present

## 2018-05-20 DIAGNOSIS — G894 Chronic pain syndrome: Secondary | ICD-10-CM | POA: Diagnosis not present

## 2018-05-28 ENCOUNTER — Telehealth (INDEPENDENT_AMBULATORY_CARE_PROVIDER_SITE_OTHER): Payer: Self-pay

## 2018-05-28 NOTE — Telephone Encounter (Signed)
PA required for Orthovisc series, right knee. Faxed completed PA form to [place] at [phone].

## 2018-05-29 DIAGNOSIS — M5416 Radiculopathy, lumbar region: Secondary | ICD-10-CM | POA: Diagnosis not present

## 2018-05-29 DIAGNOSIS — Z79899 Other long term (current) drug therapy: Secondary | ICD-10-CM | POA: Diagnosis not present

## 2018-05-29 DIAGNOSIS — G8929 Other chronic pain: Secondary | ICD-10-CM | POA: Diagnosis not present

## 2018-05-29 DIAGNOSIS — Z79891 Long term (current) use of opiate analgesic: Secondary | ICD-10-CM | POA: Diagnosis not present

## 2018-06-03 ENCOUNTER — Telehealth (INDEPENDENT_AMBULATORY_CARE_PROVIDER_SITE_OTHER): Payer: Self-pay

## 2018-06-03 NOTE — Telephone Encounter (Signed)
Please schedule patient an appointment with Dr. Durward Fortes.  Thank You  Patient is approved for Orthovisc series, right knee. [place] Patient will be responsible for 20% OOP. Co -pay $25.00 each visit PA required PA Approval# [id number] Valid 05/28/2018- 08/28/2018

## 2018-06-11 NOTE — Telephone Encounter (Signed)
LMOM for patient to call and schedule Orthovisc injections 

## 2018-06-19 ENCOUNTER — Telehealth: Payer: Self-pay | Admitting: Physician Assistant

## 2018-06-19 NOTE — Telephone Encounter (Signed)
10/9 sent Cymbalta with 5 refills. She should not need any refills. Call pharmacy to figure this out.

## 2018-06-19 NOTE — Telephone Encounter (Signed)
Patient will call pharmacy and check the status of the Rx.

## 2018-06-19 NOTE — Telephone Encounter (Signed)
Patient called and stated that she has left a VM about 1 week ago and has not received a call back about her refill of her Celexa 120 mg. I do not see this on her current list. She states she is not going to the pain clinic any more because she is not able to afford the co-pay. Pharmacy on file is correct.  Last OV was 05/13/18.Please advise.

## 2018-06-26 DIAGNOSIS — G8929 Other chronic pain: Secondary | ICD-10-CM | POA: Diagnosis not present

## 2018-06-26 DIAGNOSIS — Z79891 Long term (current) use of opiate analgesic: Secondary | ICD-10-CM | POA: Diagnosis not present

## 2018-06-26 DIAGNOSIS — M5416 Radiculopathy, lumbar region: Secondary | ICD-10-CM | POA: Diagnosis not present

## 2018-06-26 DIAGNOSIS — Z6825 Body mass index (BMI) 25.0-25.9, adult: Secondary | ICD-10-CM | POA: Diagnosis not present

## 2018-06-26 DIAGNOSIS — Z79899 Other long term (current) drug therapy: Secondary | ICD-10-CM | POA: Diagnosis not present

## 2018-07-15 ENCOUNTER — Ambulatory Visit (INDEPENDENT_AMBULATORY_CARE_PROVIDER_SITE_OTHER): Payer: Medicare HMO | Admitting: Orthopedic Surgery

## 2018-07-22 ENCOUNTER — Ambulatory Visit (INDEPENDENT_AMBULATORY_CARE_PROVIDER_SITE_OTHER): Payer: Medicare HMO | Admitting: Orthopaedic Surgery

## 2018-07-22 ENCOUNTER — Encounter (INDEPENDENT_AMBULATORY_CARE_PROVIDER_SITE_OTHER): Payer: Self-pay | Admitting: Orthopaedic Surgery

## 2018-07-22 VITALS — BP 135/90

## 2018-07-22 DIAGNOSIS — M1711 Unilateral primary osteoarthritis, right knee: Secondary | ICD-10-CM

## 2018-07-22 MED ORDER — LIDOCAINE HCL 1 % IJ SOLN
2.0000 mL | INTRAMUSCULAR | Status: AC | PRN
  Administered 2018-07-22: 2 mL

## 2018-07-22 MED ORDER — HYALURONAN 30 MG/2ML IX SOSY
30.0000 mg | PREFILLED_SYRINGE | INTRA_ARTICULAR | Status: AC | PRN
  Administered 2018-07-22: 30 mg via INTRA_ARTICULAR

## 2018-07-22 NOTE — Progress Notes (Signed)
Office Visit Note   Patient: Sophia Smith           Date of Birth: [birth year]           MRN: [id number] Visit Date: 07/22/2018              Requested by: Donella Stade, PA-C [address] [place], [place] [address] PCP: Donella Stade, PA-C   Assessment & Plan: Visit Diagnoses:  1. Unilateral primary osteoarthritis, right knee     Plan:  #1: First Orthovisc injection was given without difficulty of the right knee #2: Follow back up for her second injection next week  Follow-Up Instructions: Follow-up 1 week for injection #2  Orders:  No orders of the defined types were placed in this encounter.  No orders of the defined types were placed in this encounter.     Procedures: Large Joint Inj: R knee on 07/22/2018 1:24 PM Indications: pain and joint swelling Details: 25 G 1.5 in needle  Arthrogram: No  Medications: 2 mL lidocaine 1 %; 30 mg Hyaluronan 30 MG/2ML Outcome: tolerated well, no immediate complications Procedure, treatment alternatives, risks and benefits explained, specific risks discussed. Consent was given by the patient. Immediately prior to procedure a time out was called to verify the correct patient, procedure, equipment, support staff and site/side marked as required. Patient was prepped and draped in the usual sterile fashion.       Clinical Data: No additional findings.   Subjective: Chief Complaint  Patient presents with  . Right Knee - Pain    HPI  Sophia Smith is a very pleasant 57 year old white female who is seen today for evaluation of chronic right knee pain.  She had been recently seen in an MRI scan have been performed revealing tricompartment cartilage abnormalities about the patellofemoral joint and medially.  There was a small radial tear free edge of the body of the lateral meniscus.  She has had previous cortisone injection which had not been beneficial.  At her last visit was felt that Visco supplementation  would be her next option conservatively and she would like to proceed with that today.  Review of Systems  Constitutional: Negative for fatigue and fever.  HENT: Negative for ear pain.   Eyes: Negative for pain.  Respiratory: Negative for cough and shortness of breath.   Cardiovascular: Negative for leg swelling.  Gastrointestinal: Negative for constipation and diarrhea.  Genitourinary: Negative for difficulty urinating.  Musculoskeletal: Positive for back pain. Negative for neck pain.  Skin: Negative for rash.  Allergic/Immunologic: Negative for food allergies.  Neurological: Positive for weakness. Negative for numbness.  Hematological: Bruises/bleeds easily.  Psychiatric/Behavioral: Positive for sleep disturbance.     Objective: Vital Signs: BP 135/90   Physical Exam Constitutional:      Appearance: She is well-developed.  Eyes:     Pupils: Pupils are equal, round, and reactive to light.  Pulmonary:     Effort: Pulmonary effort is normal.  Skin:    General: Skin is warm and dry.  Neurological:     Mental Status: She is alert and oriented to person, place, and time.  Psychiatric:        Behavior: Behavior normal.     Ortho Exam  Sophia Smith today reveals minimal effusion.  Along the medial aspect of the knee more than lateral.  She does have some crepitance with range of motion.     Specialty Comments:  No specialty comments available.  Imaging: No  results found.   PMFS History: Current Outpatient Medications  Medication Sig Dispense Refill  . amitriptyline (ELAVIL) 50 MG tablet One half tab PO qHS for a week, then one tab PO qHS. 30 tablet 5  . busPIRone (BUSPAR) 7.5 MG tablet Take 1 tablet (7.5 mg total) by mouth 3 (three) times daily. 90 tablet 1  . cyclobenzaprine (FLEXERIL) 10 MG tablet Take 1 tablet (10 mg total) by mouth at bedtime. 30 tablet 5  . diclofenac sodium (VOLTAREN) 1 % GEL Apply 4 g topically 4 (four) times daily. 1 Tube 3  . doxycycline  (VIBRA-TABS) 100 MG tablet Take 1 tablet (100 mg total) by mouth 2 (two) times daily. 20 tablet 0  . DULoxetine (CYMBALTA) 60 MG capsule Take 1 capsule (60 mg total) by mouth 2 (two) times daily. 60 capsule 5  . furosemide (LASIX) 40 MG tablet Take 1 tablet (40 mg total) by mouth daily. As needed for lower extremity edema. 30 tablet 1  . gabapentin (NEURONTIN) 800 MG tablet Take 800 mg by mouth 3 (three) times daily.    . pantoprazole (PROTONIX) 40 MG tablet Take 1 tablet (40 mg total) by mouth daily. 30 tablet 3   No current facility-administered medications for this visit.     Patient Active Problem List   Diagnosis Date Noted  . Chronic venous stasis 05/14/2018  . Thumb pain, left 03/22/2018  . Extensor carpi radialis brevis tenosynovitis 03/22/2018  . Non-intractable vomiting with nausea 03/22/2018  . Hiatal hernia 03/22/2018  . Bilateral cataracts 11/04/2017  . Disorder of refraction 11/04/2017  . Mitral regurgitation 10/22/2017  . Tricuspid regurgitation 10/22/2017  . Left atrial enlargement 10/22/2017  . Lumbar spondylolysis 09/23/2017  . Drug-seeking behavior 09/19/2017  . Uncontrolled stage 2 hypertension 09/19/2017  . Opioid dependence with withdrawal ([place]) 09/19/2017  . Chronic bilateral low back pain with bilateral sciatica 09/19/2017  . Cigarette nicotine dependence without complication 54/27/0623  . Iron deficiency anemia 05/13/2017  . Internal hemorrhoid 12/05/2015  . Insomnia secondary to chronic pain 06/01/2015  . Upper back pain on right side 06/01/2015  . S/P total knee replacement 09/13/2014  . Left ankle pain 09/13/2014  . Plantar fasciitis, bilateral 03/21/2014  . Bilateral lower extremity edema 10/26/2013  . Left knee pain 10/26/2013  . Tricompartment degenerative joint disease of knee 10/26/2013  . Elevated alkaline phosphatase level 10/15/2013  . Aneurysm artery, neck ([place]) 09/15/2013  . Pelvic fracture ([place]) 09/15/2013  . Depression 09/15/2013  .  Lymphedema of leg 09/13/2013  . Pneumothorax on left 09/13/2013  . Left rib fracture 09/13/2013  . Fibromyalgia 09/13/2013  . Lumbar disc disease 09/13/2013   Past Medical History:  Diagnosis Date  . Back disorder   . Cellulitis of left lower leg 08/04/2017  . Fibromyalgia   . Knee gives out   . Other disorders of continuity of bone, right pelvic region and thigh   . Stroke [facility])     Family History  Problem Relation Age of Onset  . Heart attack Father   . Cancer Father     Past Surgical History:  Procedure Laterality Date  . CHOLECYSTECTOMY    . LEG SURGERY Bilateral    numerous leg surgeries post accident age 55  . SIGMOID RESECTION / RECTOPEXY     Social History   Occupational History  . Not on file  Tobacco Use  . Smoking status: Current Every Day Smoker    Packs/day: 0.50    Types: Cigarettes    Last  attempt to quit: 08/23/2013    Years since quitting: 4.9  . Smokeless tobacco: Never Used  Substance and Sexual Activity  . Alcohol use: No  . Drug use: No  . Sexual activity: Yes

## 2018-07-24 DIAGNOSIS — M5416 Radiculopathy, lumbar region: Secondary | ICD-10-CM | POA: Diagnosis not present

## 2018-07-24 DIAGNOSIS — Z79891 Long term (current) use of opiate analgesic: Secondary | ICD-10-CM | POA: Diagnosis not present

## 2018-07-24 DIAGNOSIS — G8929 Other chronic pain: Secondary | ICD-10-CM | POA: Diagnosis not present

## 2018-07-24 DIAGNOSIS — Z6825 Body mass index (BMI) 25.0-25.9, adult: Secondary | ICD-10-CM | POA: Diagnosis not present

## 2018-07-24 DIAGNOSIS — Z79899 Other long term (current) drug therapy: Secondary | ICD-10-CM | POA: Diagnosis not present

## 2018-07-31 DIAGNOSIS — K5903 Drug induced constipation: Secondary | ICD-10-CM | POA: Diagnosis not present

## 2018-07-31 DIAGNOSIS — R110 Nausea: Secondary | ICD-10-CM | POA: Diagnosis not present

## 2018-07-31 DIAGNOSIS — R1310 Dysphagia, unspecified: Secondary | ICD-10-CM | POA: Diagnosis not present

## 2018-07-31 DIAGNOSIS — K219 Gastro-esophageal reflux disease without esophagitis: Secondary | ICD-10-CM | POA: Diagnosis not present

## 2018-08-03 ENCOUNTER — Ambulatory Visit (INDEPENDENT_AMBULATORY_CARE_PROVIDER_SITE_OTHER): Payer: Medicare HMO | Admitting: Orthopaedic Surgery

## 2018-08-03 ENCOUNTER — Encounter (INDEPENDENT_AMBULATORY_CARE_PROVIDER_SITE_OTHER): Payer: Self-pay | Admitting: Orthopaedic Surgery

## 2018-08-03 DIAGNOSIS — M21619 Bunion of unspecified foot: Secondary | ICD-10-CM

## 2018-08-03 DIAGNOSIS — M1711 Unilateral primary osteoarthritis, right knee: Secondary | ICD-10-CM

## 2018-08-03 NOTE — Progress Notes (Signed)
Office Visit Note   Patient: Sophia Smith           Date of Birth: 1960-08-19           MRN: [id number] Visit Date: 08/03/2018              Requested by: Donella Stade, PA-C [place], [place] [address] PCP: Donella Stade, PA-C   Assessment & Plan: Visit Diagnoses:  1. Tricompartment osteoarthritis of right knee   2. Bunion of great toe     Plan: Second Orthovisc injection right knee.  Return in 1 week to complete the series of 3.  Also has painful bunion of right great toe with pes planus.  Applied a toe separator and full arch supports.  Will reevaluate over time  Follow-Up Instructions: Return in about 1 week (around 08/10/2018).   Orders:  No orders of the defined types were placed in this encounter.  No orders of the defined types were placed in this encounter.     Procedures: No procedures performed   Clinical Data: No additional findings.   Subjective: No chief complaint on file. Sophia Smith has been followed for the osteoarthritis in her right knee.  She received the first Orthovisc injection with some soreness for several days but thereafter has not had any problem.  She is ready for the second injection.  She also mention a chronic problem with her right foot with a painful bunion.  She is tried different shoes without much relief.  Has had an old injury when she was a youngster with loss of the fifth toe  HPI  Review of Systems   Objective: Vital Signs: There were no vitals taken for this visit.  Physical Exam Constitutional:      Appearance: She is well-developed.  Eyes:     Pupils: Pupils are equal, round, and reactive to light.  Pulmonary:     Effort: Pulmonary effort is normal.  Skin:    General: Skin is warm and dry.  Neurological:     Mental Status: She is alert and oriented to person, place, and time.  Psychiatric:        Behavior: Behavior normal.     Ortho Exam right knee was not hot red warm  or swollen.  Increased valgus with weightbearing.  I proceeded with a second Orthovisc injection without problem. Right foot with pes planus.  Posterior tibial tendon appears to be functioning.  Significant bunion with some redness and tenderness over the prominence of the base of the great toe.  Great toe abuts the second toe with an area of pressure.  Neurovascular exam intact.  Might have a plantar wart over the second or third metatarsal head    Specialty Comments:  No specialty comments available.  Imaging: No results found.   PMFS History: Patient Active Problem List   Diagnosis Date Noted  . Chronic venous stasis 05/14/2018  . Thumb pain, left 03/22/2018  . Extensor carpi radialis brevis tenosynovitis 03/22/2018  . Non-intractable vomiting with nausea 03/22/2018  . Hiatal hernia 03/22/2018  . Bilateral cataracts 11/04/2017  . Disorder of refraction 11/04/2017  . Mitral regurgitation 10/22/2017  . Tricuspid regurgitation 10/22/2017  . Left atrial enlargement 10/22/2017  . Lumbar spondylolysis 09/23/2017  . Drug-seeking behavior 09/19/2017  . Uncontrolled stage 2 hypertension 09/19/2017  . Opioid dependence with withdrawal ([place]) 09/19/2017  . Chronic bilateral low back pain with bilateral sciatica 09/19/2017  . Cigarette nicotine dependence  without complication 76/73/4193  . Iron deficiency anemia 05/13/2017  . Internal hemorrhoid 12/05/2015  . Insomnia secondary to chronic pain 06/01/2015  . Upper back pain on right side 06/01/2015  . S/P total knee replacement 09/13/2014  . Left ankle pain 09/13/2014  . Plantar fasciitis, bilateral 03/21/2014  . Bilateral lower extremity edema 10/26/2013  . Left knee pain 10/26/2013  . Tricompartment degenerative joint disease of knee 10/26/2013  . Elevated alkaline phosphatase level 10/15/2013  . Aneurysm artery, neck ([place]) 09/15/2013  . Pelvic fracture ([place]) 09/15/2013  . Depression 09/15/2013  . Lymphedema of leg 09/13/2013  .  Pneumothorax on left 09/13/2013  . Left rib fracture 09/13/2013  . Fibromyalgia 09/13/2013  . Lumbar disc disease 09/13/2013   Past Medical History:  Diagnosis Date  . Back disorder   . Cellulitis of left lower leg 08/04/2017  . Fibromyalgia   . Knee gives out   . Other disorders of continuity of bone, right pelvic region and thigh   . Stroke [facility])     Family History  Problem Relation Age of Onset  . Heart attack Father   . Cancer Father     Past Surgical History:  Procedure Laterality Date  . CHOLECYSTECTOMY    . LEG SURGERY Bilateral    numerous leg surgeries post accident age 83  . SIGMOID RESECTION / RECTOPEXY     Social History   Occupational History  . Not on file  Tobacco Use  . Smoking status: Current Every Day Smoker    Packs/day: 0.50    Types: Cigarettes    Last attempt to quit: 08/23/2013    Years since quitting: 4.9  . Smokeless tobacco: Never Used  Substance and Sexual Activity  . Alcohol use: No  . Drug use: No  . Sexual activity: Yes     Garald Balding, MD   Note - This record has been created using [employer].  Chart creation errors have been sought, but may not always  have been located. Such creation errors do not reflect on  the standard of medical care.

## 2018-08-06 DIAGNOSIS — K449 Diaphragmatic hernia without obstruction or gangrene: Secondary | ICD-10-CM | POA: Diagnosis not present

## 2018-08-06 DIAGNOSIS — R1319 Other dysphagia: Secondary | ICD-10-CM | POA: Diagnosis not present

## 2018-08-10 ENCOUNTER — Ambulatory Visit (INDEPENDENT_AMBULATORY_CARE_PROVIDER_SITE_OTHER): Payer: Medicare HMO | Admitting: Orthopaedic Surgery

## 2018-08-11 ENCOUNTER — Ambulatory Visit (INDEPENDENT_AMBULATORY_CARE_PROVIDER_SITE_OTHER): Payer: Medicare HMO | Admitting: Orthopaedic Surgery

## 2018-08-13 ENCOUNTER — Ambulatory Visit (INDEPENDENT_AMBULATORY_CARE_PROVIDER_SITE_OTHER): Payer: Medicare HMO | Admitting: Orthopaedic Surgery

## 2018-08-13 ENCOUNTER — Encounter (INDEPENDENT_AMBULATORY_CARE_PROVIDER_SITE_OTHER): Payer: Self-pay | Admitting: Orthopaedic Surgery

## 2018-08-13 DIAGNOSIS — M1711 Unilateral primary osteoarthritis, right knee: Secondary | ICD-10-CM

## 2018-08-13 DIAGNOSIS — L84 Corns and callosities: Secondary | ICD-10-CM

## 2018-08-13 MED ORDER — HYALURONAN 30 MG/2ML IX SOSY
30.0000 mg | PREFILLED_SYRINGE | INTRA_ARTICULAR | Status: AC | PRN
  Administered 2018-08-13: 30 mg via INTRA_ARTICULAR

## 2018-08-13 NOTE — Progress Notes (Signed)
Office Visit Note   Patient: Sophia Smith           Date of Birth: [birth year]           MRN: [id number] Visit Date: 08/13/2018              Requested by: Donella Stade, PA-C [place], [place] [address] PCP: Donella Stade, PA-C   Assessment & Plan: Visit Diagnoses:  1. Tricompartment osteoarthritis of right knee     Plan: Third Orthovisc injection right knee.  We will plan to see her back as needed.  Also has thickened plantar callus right foot which I will shave will use pumice stone in the interim  Follow-Up Instructions: Return if symptoms worsen or fail to improve.   Orders:  No orders of the defined types were placed in this encounter.  No orders of the defined types were placed in this encounter.     Procedures: Incision & Drainage Date/Time: 08/13/2018 1:59 PM Performed by: Garald Balding, MD Authorized by: Garald Balding, MD   Consent:    Consent given by:  Patient Pre-procedure details:    Skin preparation:  Alcohol Anesthesia (see MAR for exact dosages):    Anesthesia method:  None Procedure type:    Complexity:  Simple Procedure details:    Incision depth:  Subcutaneous   Scalpel blade:  15 Comments:     Right foot plantar callous shaved-much better  Large Joint Inj: R knee on 08/13/2018 2:03 PM Indications: pain and joint swelling Details: 25 G 1.5 in needle  Arthrogram: No  Medications: 30 mg Hyaluronan 30 MG/2ML Outcome: tolerated well, no immediate complications Procedure, treatment alternatives, risks and benefits explained, specific risks discussed. Consent was given by the patient. Immediately prior to procedure a time out was called to verify the correct patient, procedure, equipment, support staff and site/side marked as required. Patient was prepped and draped in the usual sterile fashion.       Clinical Data: No additional findings.   Subjective: No chief complaint on file. Has had 2  prior Orthovisc injections right knee with some relief.  Will have the third today.  Also has a thickened plantar callus right foot that she would like to have shaved  HPI  Review of Systems   Objective: Vital Signs: There were no vitals taken for this visit.  Physical Exam  Ortho Exam right knee was not hot red warm or swollen.  Predominant medial joint pain.  We will proceed with the Orthovisc injection.  Has thickened painful plantar callus right foot in the area of the third metatarsal head.  No evidence of a plantar wart.  We will shave this today Specialty Comments:  No specialty comments available.  Imaging: No results found.   PMFS History: Patient Active Problem List   Diagnosis Date Noted  . Chronic venous stasis 05/14/2018  . Thumb pain, left 03/22/2018  . Extensor carpi radialis brevis tenosynovitis 03/22/2018  . Non-intractable vomiting with nausea 03/22/2018  . Hiatal hernia 03/22/2018  . Bilateral cataracts 11/04/2017  . Disorder of refraction 11/04/2017  . Mitral regurgitation 10/22/2017  . Tricuspid regurgitation 10/22/2017  . Left atrial enlargement 10/22/2017  . Lumbar spondylolysis 09/23/2017  . Drug-seeking behavior 09/19/2017  . Uncontrolled stage 2 hypertension 09/19/2017  . Opioid dependence with withdrawal ([place]) 09/19/2017  . Chronic bilateral low back pain with bilateral sciatica 09/19/2017  . Cigarette nicotine dependence without complication 72/53/6644  .  Iron deficiency anemia 05/13/2017  . Internal hemorrhoid 12/05/2015  . Insomnia secondary to chronic pain 06/01/2015  . Upper back pain on right side 06/01/2015  . S/P total knee replacement 09/13/2014  . Left ankle pain 09/13/2014  . Plantar fasciitis, bilateral 03/21/2014  . Bilateral lower extremity edema 10/26/2013  . Left knee pain 10/26/2013  . Tricompartment degenerative joint disease of knee 10/26/2013  . Elevated alkaline phosphatase level 10/15/2013  . Aneurysm artery, neck  ([place]) 09/15/2013  . Pelvic fracture ([place]) 09/15/2013  . Depression 09/15/2013  . Lymphedema of leg 09/13/2013  . Pneumothorax on left 09/13/2013  . Left rib fracture 09/13/2013  . Fibromyalgia 09/13/2013  . Lumbar disc disease 09/13/2013   Past Medical History:  Diagnosis Date  . Back disorder   . Cellulitis of left lower leg 08/04/2017  . Fibromyalgia   . Knee gives out   . Other disorders of continuity of bone, right pelvic region and thigh   . Stroke [facility] [place])     Family History  Problem Relation Age of Onset  . Heart attack Father   . Cancer Father     Past Surgical History:  Procedure Laterality Date  . CHOLECYSTECTOMY    . LEG SURGERY Bilateral    numerous leg surgeries post accident age 58  . SIGMOID RESECTION / RECTOPEXY     Social History   Occupational History  . Not on file  Tobacco Use  . Smoking status: Current Every Day Smoker    Packs/day: 0.50    Types: Cigarettes    Last attempt to quit: 08/23/2013    Years since quitting: 4.9  . Smokeless tobacco: Never Used  Substance and Sexual Activity  . Alcohol use: No  . Drug use: No  . Sexual activity: Yes     Garald Balding, MD   Note - This record has been created using [employer].  Chart creation errors have been sought, but may not always  have been located. Such creation errors do not reflect on  the standard of medical care.

## 2018-08-21 ENCOUNTER — Other Ambulatory Visit: Payer: Self-pay | Admitting: Physician Assistant

## 2018-08-21 DIAGNOSIS — Z79899 Other long term (current) drug therapy: Secondary | ICD-10-CM | POA: Diagnosis not present

## 2018-08-21 DIAGNOSIS — E860 Dehydration: Secondary | ICD-10-CM | POA: Diagnosis not present

## 2018-08-21 DIAGNOSIS — R112 Nausea with vomiting, unspecified: Secondary | ICD-10-CM

## 2018-08-21 DIAGNOSIS — G8929 Other chronic pain: Secondary | ICD-10-CM | POA: Diagnosis not present

## 2018-08-21 DIAGNOSIS — M5416 Radiculopathy, lumbar region: Secondary | ICD-10-CM | POA: Diagnosis not present

## 2018-08-21 DIAGNOSIS — Z6825 Body mass index (BMI) 25.0-25.9, adult: Secondary | ICD-10-CM | POA: Diagnosis not present

## 2018-08-21 DIAGNOSIS — K449 Diaphragmatic hernia without obstruction or gangrene: Secondary | ICD-10-CM

## 2018-08-31 ENCOUNTER — Ambulatory Visit (INDEPENDENT_AMBULATORY_CARE_PROVIDER_SITE_OTHER): Payer: Medicare HMO | Admitting: Physician Assistant

## 2018-08-31 ENCOUNTER — Telehealth: Payer: Self-pay | Admitting: Physician Assistant

## 2018-08-31 ENCOUNTER — Encounter: Payer: Self-pay | Admitting: Physician Assistant

## 2018-08-31 VITALS — BP 147/87 | HR 91 | Temp 98.5°F | Wt 172.5 lb

## 2018-08-31 DIAGNOSIS — R69 Illness, unspecified: Secondary | ICD-10-CM | POA: Diagnosis not present

## 2018-08-31 DIAGNOSIS — F17200 Nicotine dependence, unspecified, uncomplicated: Secondary | ICD-10-CM

## 2018-08-31 DIAGNOSIS — M5442 Lumbago with sciatica, left side: Secondary | ICD-10-CM

## 2018-08-31 DIAGNOSIS — Z1231 Encounter for screening mammogram for malignant neoplasm of breast: Secondary | ICD-10-CM

## 2018-08-31 DIAGNOSIS — R062 Wheezing: Secondary | ICD-10-CM | POA: Diagnosis not present

## 2018-08-31 DIAGNOSIS — L03116 Cellulitis of left lower limb: Secondary | ICD-10-CM

## 2018-08-31 DIAGNOSIS — I7774 Dissection of vertebral artery: Secondary | ICD-10-CM

## 2018-08-31 DIAGNOSIS — I878 Other specified disorders of veins: Secondary | ICD-10-CM | POA: Diagnosis not present

## 2018-08-31 DIAGNOSIS — I720 Aneurysm of carotid artery: Secondary | ICD-10-CM | POA: Diagnosis not present

## 2018-08-31 DIAGNOSIS — R82998 Other abnormal findings in urine: Secondary | ICD-10-CM

## 2018-08-31 DIAGNOSIS — G8929 Other chronic pain: Secondary | ICD-10-CM

## 2018-08-31 DIAGNOSIS — M5441 Lumbago with sciatica, right side: Secondary | ICD-10-CM

## 2018-08-31 DIAGNOSIS — R600 Localized edema: Secondary | ICD-10-CM

## 2018-08-31 LAB — POCT URINALYSIS DIPSTICK
BILIRUBIN UA: NEGATIVE
Blood, UA: NEGATIVE
Glucose, UA: NEGATIVE
Ketones, UA: NEGATIVE
LEUKOCYTES UA: NEGATIVE
Nitrite, UA: NEGATIVE
Protein, UA: NEGATIVE
Spec Grav, UA: 1.02 (ref 1.010–1.025)
Urobilinogen, UA: 0.2 E.U./dL
pH, UA: 6.5 (ref 5.0–8.0)

## 2018-08-31 LAB — COMPLETE METABOLIC PANEL WITH GFR
AG RATIO: 1.8 (calc) (ref 1.0–2.5)
ALT: 6 U/L (ref 6–29)
AST: 9 U/L — AB (ref 10–35)
Albumin: 3.7 g/dL (ref 3.6–5.1)
Alkaline phosphatase (APISO): 116 U/L (ref 33–130)
BUN: 8 mg/dL (ref 7–25)
CALCIUM: 8.6 mg/dL (ref 8.6–10.4)
CHLORIDE: 104 mmol/L (ref 98–110)
CO2: 32 mmol/L (ref 20–32)
Creat: 0.54 mg/dL (ref 0.50–1.05)
GFR, EST NON AFRICAN AMERICAN: 105 mL/min/{1.73_m2} (ref >=60)
GFR, Est African American: 122 mL/min/{1.73_m2} (ref >=60)
Globulin: 2.1 g/dL (calc) (ref 1.9–3.7)
Glucose, Bld: 96 mg/dL (ref 65–99)
POTASSIUM: 4 mmol/L (ref 3.5–5.3)
Sodium: 142 mmol/L (ref 135–146)
TOTAL PROTEIN: 5.8 g/dL — AB (ref 6.1–8.1)
Total Bilirubin: 0.3 mg/dL (ref 0.2–1.2)

## 2018-08-31 LAB — CBC WITH DIFFERENTIAL/PLATELET
ABSOLUTE MONOCYTES: 634 {cells}/uL (ref 200–950)
BASOS ABS: 61 {cells}/uL (ref 0–200)
Basophils Relative: 1 %
EOS ABS: 189 {cells}/uL (ref 15–500)
Eosinophils Relative: 3.1 %
HCT: 38.6 % (ref 35.0–45.0)
HEMOGLOBIN: 13.1 g/dL (ref 11.7–15.5)
Lymphs Abs: 2208 cells/uL (ref 850–3900)
MCH: 29.8 pg (ref 27.0–33.0)
MCHC: 33.9 g/dL (ref 32.0–36.0)
MCV: 87.7 fL (ref 80.0–100.0)
MONOS PCT: 10.4 %
MPV: 9.2 fL (ref 7.5–12.5)
Neutro Abs: 3007 cells/uL (ref 1500–7800)
Neutrophils Relative %: 49.3 %
Platelets: 266 10*3/uL (ref 140–400)
RBC: 4.4 10*6/uL (ref 3.80–5.10)
RDW: 13 % (ref 11.0–15.0)
Total Lymphocyte: 36.2 %
WBC: 6.1 10*3/uL (ref 3.8–10.8)

## 2018-08-31 MED ORDER — ALBUTEROL SULFATE HFA 108 (90 BASE) MCG/ACT IN AERS: 2.0000 | INHALATION_SPRAY | Freq: Four times a day (QID) | RESPIRATORY_TRACT | 1 refills | Status: DC | PRN

## 2018-08-31 MED ORDER — DOXYCYCLINE HYCLATE 100 MG PO TABS: 100.0000 mg | ORAL_TABLET | Freq: Two times a day (BID) | ORAL | 0 refills | Status: DC

## 2018-08-31 MED ORDER — FUROSEMIDE 40 MG PO TABS: 40.0000 mg | ORAL_TABLET | Freq: Every day | ORAL | 0 refills | Status: AC

## 2018-08-31 NOTE — Patient Instructions (Signed)
Will get CT of neck.  Start doxycycline.

## 2018-08-31 NOTE — Progress Notes (Addendum)
Subjective:    Patient ID: Sophia Smith, female    DOB: [birth year], 58 y.o.   MRN: [id number]  HPI: This is a 58 yo female with hx of recurrent cellulitis and chronic pain who presents to the clinic today with a chief complaint of left lower leg cellulitis. Patient states this began 2 days ago and her leg is red, warm, and swollen. Her right leg is also affected but not as badly. She denies fever, abdominal pain. She is currently receiving Methadone from a clinic and is frustrated as she wishes to change her prescription but is being seen by an interim provider who cannot make these changes.   Last week when at the pain clinic her urine was dark and they were concerned. They gave her fluids and stated to follow up with PCP. Her urine is a little dark today but has improved with hydration. No dysuria, frequency, or urgency issues. No fever, chills, body aches.   She is a current 0.5 pack/day smoker. States she has tried Chantix before but she would sleep walk.   Patient is also concerned about an aneurysm in her left neck which has not been imaged since 2014 and then her provider, Dr. Mee Hives, moved to an out of network location.   .. Active Ambulatory Problems    Diagnosis Date Noted  . Lymphedema of leg 09/13/2013  . Pneumothorax on left 09/13/2013  . Left rib fracture 09/13/2013  . Fibromyalgia 09/13/2013  . Lumbar disc disease 09/13/2013  . Aneurysm artery, neck ([place]) 09/15/2013  . Pelvic fracture ([place]) 09/15/2013  . Depression 09/15/2013  . Elevated alkaline phosphatase level 10/15/2013  . Bilateral lower extremity edema 10/26/2013  . Left knee pain 10/26/2013  . Tricompartment degenerative joint disease of knee 10/26/2013  . Plantar fasciitis, bilateral 03/21/2014  . S/P total knee replacement 09/13/2014  . Left ankle pain 09/13/2014  . Insomnia secondary to chronic pain 06/01/2015  . Upper back pain on right side 06/01/2015  . Internal hemorrhoid 12/05/2015  . Iron  deficiency anemia 05/13/2017  . Drug-seeking behavior 09/19/2017  . Uncontrolled stage 2 hypertension 09/19/2017  . Opioid dependence with withdrawal ([place]) 09/19/2017  . Chronic bilateral low back pain with bilateral sciatica 09/19/2017  . Cigarette nicotine dependence without complication 03/70/4888  . Lumbar spondylolysis 09/23/2017  . Mitral regurgitation 10/22/2017  . Tricuspid regurgitation 10/22/2017  . Left atrial enlargement 10/22/2017  . Bilateral cataracts 11/04/2017  . Disorder of refraction 11/04/2017  . Thumb pain, left 03/22/2018  . Extensor carpi radialis brevis tenosynovitis 03/22/2018  . Non-intractable vomiting with nausea 03/22/2018  . Hiatal hernia 03/22/2018  . Chronic venous stasis 05/14/2018  . Plantar callus 08/13/2018  . Vertebral artery dissection ([place]) 09/01/2018  . Current smoker 09/01/2018   Resolved Ambulatory Problems    Diagnosis Date Noted  . Acute stress reaction 06/01/2015  . Seroma, post-traumatic ([place]) 05/13/2017  . Cellulitis of left lower leg 08/04/2017  . Wound of left leg 08/04/2017   Past Medical History:  Diagnosis Date  . Back disorder   . Knee gives out   . Other disorders of continuity of bone, right pelvic region and thigh   . Stroke [facility])        Review of Systems  Constitutional: Negative for chills and fever.  Respiratory: Negative for cough and shortness of breath.   Cardiovascular: Negative for chest pain.  Gastrointestinal: Negative for abdominal pain and blood in stool.  Skin: Positive for color change. Negative for rash.  Objective:   Physical Exam Vitals signs reviewed.  Constitutional:      General: She is not in acute distress.    Appearance: She is not ill-appearing.  HENT:     Head: Normocephalic and atraumatic.     Right Ear: External ear normal.     Left Ear: External ear normal.     Nose: Nose normal. No congestion or rhinorrhea.     Mouth/Throat:     Mouth: Mucous membranes are moist.      Pharynx: Oropharynx is clear. No oropharyngeal exudate or posterior oropharyngeal erythema.  Eyes:     General: No scleral icterus.       Right eye: No discharge.        Left eye: No discharge.     Extraocular Movements: Extraocular movements intact.     Conjunctiva/sclera: Conjunctivae normal.     Pupils: Pupils are equal, round, and reactive to light.  Cardiovascular:     Rate and Rhythm: Normal rate.     Pulses: Normal pulses.     Heart sounds: Normal heart sounds.  Pulmonary:     Effort: Pulmonary effort is normal. No respiratory distress.     Breath sounds: Wheezing present. No rhonchi or rales.  Abdominal:     General: Abdomen is flat. Bowel sounds are normal. There is no distension.  Skin:    General: Skin is warm and dry.     Capillary Refill: Capillary refill takes less than 2 seconds.     Coloration: Skin is not jaundiced.     Findings: No bruising or rash.     Comments: Bilateral warmth, swelling, and redness of lower legs.   Neurological:     Mental Status: She is alert.     Coordination: Coordination normal.     Gait: Gait normal.  Psychiatric:        Mood and Affect: Mood normal.        Behavior: Behavior normal.        Thought Content: Thought content normal.           Assessment & Plan:  Marland KitchenMarland KitchenDiagnoses and all orders for this visit:  Cellulitis of left lower extremity -     CBC with Differential/Platelet -     doxycycline (VIBRA-TABS) 100 MG tablet; Take 1 tablet (100 mg total) by mouth 2 (two) times daily.  Dark urine -     COMPLETE METABOLIC PANEL WITH GFR -     CBC with Differential/Platelet -     POCT Urinalysis Dipstick  Chronic bilateral low back pain with bilateral sciatica  Bilateral wheezing -     albuterol (PROVENTIL HFA;VENTOLIN HFA) 108 (90 Base) MCG/ACT inhaler; Inhale 2 puffs into the lungs every 6 (six) hours as needed for wheezing or shortness of breath.  Aneurysm artery, neck (HCC) -     CT ANGIO NECK W OR WO CONTRAST  Chronic  venous stasis  Bilateral lower extremity edema -     furosemide (LASIX) 40 MG tablet; Take 1 tablet (40 mg total) by mouth daily. As needed for lower extremity edema.  Vertebral artery dissection (HCC) -     CT ANGIO NECK W OR WO CONTRAST  Current smoker   Treated cellulitis of legs with doxycycline and discussed keeping legs elevated. Refilled lasix to use for edema. Follow up as needed.   .. Results for orders placed or performed in visit on 08/31/18  COMPLETE METABOLIC PANEL WITH GFR  Result Value Ref Range   Glucose, Bld  96 65 - 99 mg/dL   BUN 8 7 - 25 mg/dL   Creat 0.54 0.50 - 1.05 mg/dL   GFR, Est Non African American 105 > OR = 60 mL/min/1.59m2   GFR, Est African American 122 > OR = 60 mL/min/1.39m2   BUN/Creatinine Ratio NOT APPLICABLE 6 - 22 (calc)   Sodium 142 135 - 146 mmol/L   Potassium 4.0 3.5 - 5.3 mmol/L   Chloride 104 98 - 110 mmol/L   CO2 32 20 - 32 mmol/L   Calcium 8.6 8.6 - 10.4 mg/dL   Total Protein 5.8 (L) 6.1 - 8.1 g/dL   Albumin 3.7 3.6 - 5.1 g/dL   Globulin 2.1 1.9 - 3.7 g/dL (calc)   AG Ratio 1.8 1.0 - 2.5 (calc)   Total Bilirubin 0.3 0.2 - 1.2 mg/dL   Alkaline phosphatase (APISO) 116 33 - 130 U/L   AST 9 (L) 10 - 35 U/L   ALT 6 6 - 29 U/L  CBC with Differential/Platelet  Result Value Ref Range   WBC 6.1 3.8 - 10.8 Thousand/uL   RBC 4.40 3.80 - 5.10 Million/uL   Hemoglobin 13.1 11.7 - 15.5 g/dL   HCT 38.6 35.0 - 45.0 %   MCV 87.7 80.0 - 100.0 fL   MCH 29.8 27.0 - 33.0 pg   MCHC 33.9 32.0 - 36.0 g/dL   RDW 13.0 11.0 - 15.0 %   Platelets 266 140 - 400 Thousand/uL   MPV 9.2 7.5 - 12.5 fL   Neutro Abs 3,007 1,500 - 7,800 cells/uL   Lymphs Abs 2,208 850 - 3,900 cells/uL   Absolute Monocytes 634 200 - 950 cells/uL   Eosinophils Absolute 189 15 - 500 cells/uL   Basophils Absolute 61 0 - 200 cells/uL   Neutrophils Relative % 49.3 %   Total Lymphocyte 36.2 %   Monocytes Relative 10.4 %   Eosinophils Relative 3.1 %   Basophils Relative 1.0 %   POCT Urinalysis Dipstick  Result Value Ref Range   Color, UA yellow    Clarity, UA clear    Glucose, UA Negative Negative   Bilirubin, UA neg    Ketones, UA neg    Spec Grav, UA 1.020 1.010 - 1.025   Blood, UA neg    pH, UA 6.5 5.0 - 8.0   Protein, UA Negative Negative   Urobilinogen, UA 0.2 0.2 or 1.0 E.U./dL   Nitrite, UA neg    Leukocytes, UA Negative Negative   Appearance     Odor     UA appeared normal. Will get CBC and CMP to further evaluate. Reassurance given today likely color was from dehydration. Follow up as needed.   Last visit with neurosurgery was 2014.   Last CT showed below. Provider suggested 2 year follow up. It has been 6 years. Will order CTA of neck.   1. 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 Visualized portions of the lumen in the proximal and distal aspect of the right internal carotid stent are patent. Streak artifact related to the carotid coil obscures the evaluation of the middle aspect of the carotid stent lumen. 2. Mild cerebellar tonsillar ectopia noted in the partially imaged portions of the brain.  Strongly encouraged smoking cessation. Wheezing heard on exam today without any c/o coughing, fever, chills. Albuterol inhaler sent to use as needed. Pt did not tolerate chantix. Will try patches.   Marland Kitchen.Spent 30 minutes with patient and greater than 50 percent of visit spent counseling  patient regarding treatment plan.  Marland KitchenVernetta Honey PA-C, have reviewed and agree with the above documentation in it's entirety.

## 2018-08-31 NOTE — Progress Notes (Deleted)
   Subjective:    Patient ID: Sophia Smith, female    DOB: [birth year], 58 y.o.   MRN: [id number]  HPI Pain clinic dark urine not feel  Dr. Mee Hives out with back surgery. Cannot change medication.   Need CT of neck 09/11/2012  vetebral artery dissection.  CT angiography of neck-  1. 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 Visualized portions of the lumen in the proximal and distal aspect of the right internal carotid stent are patent. Streak artifact related to the carotid coil obscures the evaluation of the middle aspect of the carotid stent lumen. 2. Mild cerebellar tonsillar ectopia noted in the partially imaged portions of the brain. Review of Systems     Objective:   Physical Exam        Assessment & Plan:

## 2018-08-31 NOTE — Telephone Encounter (Signed)
Pt called and stated she can not afford the inhaler that was prescribed it was 50.00, can something else be used. Pt is requesting advice.

## 2018-09-01 DIAGNOSIS — I7774 Dissection of vertebral artery: Secondary | ICD-10-CM | POA: Insufficient documentation

## 2018-09-01 DIAGNOSIS — Z720 Tobacco use: Secondary | ICD-10-CM | POA: Insufficient documentation

## 2018-09-01 DIAGNOSIS — F17200 Nicotine dependence, unspecified, uncomplicated: Secondary | ICD-10-CM | POA: Insufficient documentation

## 2018-09-01 NOTE — Telephone Encounter (Signed)
Please call patient and educate her about good [facility]. I looked up and 26 dollars at [facility]. Different pharmacies have different prices. Please send albuterol to [facility] in [place] [address] [place] as last sent to [facility] and [facility] has different prices at [facility].

## 2018-09-01 NOTE — Progress Notes (Signed)
Call pt: kidney, liver look good. Not anemic. Protein is low suggesting your nutrition is lacking. Make sure getting enough protein in diet. Consider boost/ensure as a supplement as well.

## 2018-09-02 NOTE — Telephone Encounter (Signed)
Attempted to call patient, no voicemail setup. 

## 2018-09-07 NOTE — Telephone Encounter (Signed)
LM for retuen call.

## 2018-09-08 ENCOUNTER — Ambulatory Visit (HOSPITAL_BASED_OUTPATIENT_CLINIC_OR_DEPARTMENT_OTHER): Payer: Medicare HMO

## 2018-09-16 ENCOUNTER — Ambulatory Visit (HOSPITAL_BASED_OUTPATIENT_CLINIC_OR_DEPARTMENT_OTHER)
Admission: RE | Admit: 2018-09-16 | Discharge: 2018-09-16 | Disposition: A | Discharge status: Home / Self Care | Payer: Medicare HMO | Source: Ambulatory Visit | Attending: Physician Assistant | Admitting: Physician Assistant

## 2018-09-16 ENCOUNTER — Encounter (HOSPITAL_BASED_OUTPATIENT_CLINIC_OR_DEPARTMENT_OTHER): Payer: Self-pay

## 2018-09-16 DIAGNOSIS — I7774 Dissection of vertebral artery: Secondary | ICD-10-CM | POA: Diagnosis present

## 2018-09-16 DIAGNOSIS — I720 Aneurysm of carotid artery: Secondary | ICD-10-CM | POA: Insufficient documentation

## 2018-09-16 DIAGNOSIS — I6521 Occlusion and stenosis of right carotid artery: Secondary | ICD-10-CM | POA: Diagnosis not present

## 2018-09-16 MED ORDER — IOPAMIDOL (ISOVUE-370) INJECTION 76%
100.0000 mL | Freq: Once | INTRAVENOUS | Status: AC | PRN
  Administered 2018-09-16: 100 mL via INTRAVENOUS

## 2018-09-16 NOTE — Progress Notes (Signed)
Stable CTA.

## 2018-09-18 DIAGNOSIS — Z6826 Body mass index (BMI) 26.0-26.9, adult: Secondary | ICD-10-CM | POA: Diagnosis not present

## 2018-09-18 DIAGNOSIS — Z79891 Long term (current) use of opiate analgesic: Secondary | ICD-10-CM | POA: Diagnosis not present

## 2018-09-18 DIAGNOSIS — Z79899 Other long term (current) drug therapy: Secondary | ICD-10-CM | POA: Diagnosis not present

## 2018-09-18 DIAGNOSIS — M5416 Radiculopathy, lumbar region: Secondary | ICD-10-CM | POA: Diagnosis not present

## 2018-09-18 DIAGNOSIS — G8929 Other chronic pain: Secondary | ICD-10-CM | POA: Diagnosis not present

## 2018-09-25 DIAGNOSIS — R69 Illness, unspecified: Secondary | ICD-10-CM | POA: Diagnosis not present

## 2018-09-30 DIAGNOSIS — R69 Illness, unspecified: Secondary | ICD-10-CM | POA: Diagnosis not present

## 2018-10-07 DIAGNOSIS — R69 Illness, unspecified: Secondary | ICD-10-CM | POA: Diagnosis not present

## 2018-10-08 DIAGNOSIS — R69 Illness, unspecified: Secondary | ICD-10-CM | POA: Diagnosis not present

## 2018-10-08 DIAGNOSIS — Z780 Asymptomatic menopausal state: Secondary | ICD-10-CM | POA: Diagnosis not present

## 2018-10-08 DIAGNOSIS — Z01419 Encounter for gynecological examination (general) (routine) without abnormal findings: Secondary | ICD-10-CM | POA: Diagnosis not present

## 2018-10-14 DIAGNOSIS — Z6826 Body mass index (BMI) 26.0-26.9, adult: Secondary | ICD-10-CM | POA: Diagnosis not present

## 2018-10-14 DIAGNOSIS — Z79899 Other long term (current) drug therapy: Secondary | ICD-10-CM | POA: Diagnosis not present

## 2018-10-14 DIAGNOSIS — G8929 Other chronic pain: Secondary | ICD-10-CM | POA: Diagnosis not present

## 2018-10-14 DIAGNOSIS — M5416 Radiculopathy, lumbar region: Secondary | ICD-10-CM | POA: Diagnosis not present

## 2018-10-14 DIAGNOSIS — Z79891 Long term (current) use of opiate analgesic: Secondary | ICD-10-CM | POA: Diagnosis not present

## 2018-11-12 DIAGNOSIS — R69 Illness, unspecified: Secondary | ICD-10-CM | POA: Diagnosis not present

## 2018-11-12 DIAGNOSIS — Z79899 Other long term (current) drug therapy: Secondary | ICD-10-CM | POA: Diagnosis not present

## 2018-11-12 DIAGNOSIS — M5416 Radiculopathy, lumbar region: Secondary | ICD-10-CM | POA: Diagnosis not present

## 2018-11-12 DIAGNOSIS — Z79891 Long term (current) use of opiate analgesic: Secondary | ICD-10-CM | POA: Diagnosis not present

## 2018-12-09 DIAGNOSIS — G8929 Other chronic pain: Secondary | ICD-10-CM | POA: Diagnosis not present

## 2018-12-09 DIAGNOSIS — Z79899 Other long term (current) drug therapy: Secondary | ICD-10-CM | POA: Diagnosis not present

## 2018-12-09 DIAGNOSIS — M5416 Radiculopathy, lumbar region: Secondary | ICD-10-CM | POA: Diagnosis not present

## 2018-12-09 DIAGNOSIS — R69 Illness, unspecified: Secondary | ICD-10-CM | POA: Diagnosis not present

## 2018-12-09 DIAGNOSIS — Z79891 Long term (current) use of opiate analgesic: Secondary | ICD-10-CM | POA: Diagnosis not present

## 2018-12-17 DIAGNOSIS — M545 Low back pain: Secondary | ICD-10-CM | POA: Diagnosis not present

## 2018-12-17 DIAGNOSIS — M5136 Other intervertebral disc degeneration, lumbar region: Secondary | ICD-10-CM | POA: Diagnosis not present

## 2018-12-24 DIAGNOSIS — M5136 Other intervertebral disc degeneration, lumbar region: Secondary | ICD-10-CM | POA: Diagnosis not present

## 2019-01-06 DIAGNOSIS — I10 Essential (primary) hypertension: Secondary | ICD-10-CM | POA: Diagnosis not present

## 2019-01-06 DIAGNOSIS — Z1159 Encounter for screening for other viral diseases: Secondary | ICD-10-CM | POA: Diagnosis not present

## 2019-01-06 DIAGNOSIS — Z79899 Other long term (current) drug therapy: Secondary | ICD-10-CM | POA: Diagnosis not present

## 2019-01-29 ENCOUNTER — Encounter: Payer: Self-pay | Admitting: Physician Assistant

## 2019-01-29 ENCOUNTER — Other Ambulatory Visit: Payer: Self-pay

## 2019-01-29 ENCOUNTER — Ambulatory Visit (INDEPENDENT_AMBULATORY_CARE_PROVIDER_SITE_OTHER): Payer: Medicare HMO | Admitting: Physician Assistant

## 2019-01-29 VITALS — BP 177/103 | HR 96 | Temp 99.1°F | Ht 69.0 in | Wt 171.0 lb

## 2019-01-29 DIAGNOSIS — G4489 Other headache syndrome: Secondary | ICD-10-CM

## 2019-01-29 MED ORDER — RIZATRIPTAN BENZOATE 10 MG PO TABS: 10.0000 mg | ORAL_TABLET | ORAL | 0 refills | Status: DC | PRN

## 2019-01-29 MED ORDER — TRAMADOL HCL 50 MG PO TABS: 50.0000 mg | ORAL_TABLET | Freq: Three times a day (TID) | ORAL | 0 refills | Status: AC | PRN

## 2019-01-29 MED ORDER — AMOXICILLIN-POT CLAVULANATE 875-125 MG PO TABS: 1.0000 | ORAL_TABLET | Freq: Two times a day (BID) | ORAL | 0 refills | Status: DC

## 2019-01-29 NOTE — Progress Notes (Signed)
Subjective:    Patient ID: Sophia Smith, female    DOB: 1960-11-10, 58 y.o.   MRN: [id number]  HPI  Pt is a 58 yo female with pmhx of aneurysm of neck, vertebral artery dissection, HTN who presents to the clinic with headache and left ear pain. Headache started pretty suddenly yesterday. She describes headache as sharp and consistent. She denies this being the "worst headache of her life" but reports tylenol is not touching it and "pretty bad". She denies any phono sensitivity. No vomiting but she feels nauseated. She does feel light sensitive. She admits she did try to cut caffeine cold [place] yesterday. She start drinking a mt. Dew around lung and starting to feel a little better. She has a lot of sinus pressure as well. Cannot take NSAIDs due to GI upset. She denies any recent head injuries, travel, vision changes, extremity weakness.   She continues to smoke.  .. Active Ambulatory Problems    Diagnosis Date Noted  . Lymphedema of leg 09/13/2013  . Pneumothorax on left 09/13/2013  . Left rib fracture 09/13/2013  . Fibromyalgia 09/13/2013  . Lumbar disc disease 09/13/2013  . Aneurysm artery, neck ([place]) 09/15/2013  . Pelvic fracture ([place]) 09/15/2013  . Depression 09/15/2013  . Elevated alkaline phosphatase level 10/15/2013  . Bilateral lower extremity edema 10/26/2013  . Left knee pain 10/26/2013  . Tricompartment degenerative joint disease of knee 10/26/2013  . Plantar fasciitis, bilateral 03/21/2014  . S/P total knee replacement 09/13/2014  . Left ankle pain 09/13/2014  . Insomnia secondary to chronic pain 06/01/2015  . Upper back pain on right side 06/01/2015  . Internal hemorrhoid 12/05/2015  . Iron deficiency anemia 05/13/2017  . Drug-seeking behavior 09/19/2017  . Uncontrolled stage 2 hypertension 09/19/2017  . Opioid dependence with withdrawal ([place]) 09/19/2017  . Chronic bilateral low back pain with bilateral sciatica 09/19/2017  . Cigarette nicotine dependence  without complication 34/28/7681  . Lumbar spondylolysis 09/23/2017  . Mitral regurgitation 10/22/2017  . Tricuspid regurgitation 10/22/2017  . Left atrial enlargement 10/22/2017  . Bilateral cataracts 11/04/2017  . Disorder of refraction 11/04/2017  . Thumb pain, left 03/22/2018  . Extensor carpi radialis brevis tenosynovitis 03/22/2018  . Non-intractable vomiting with nausea 03/22/2018  . Hiatal hernia 03/22/2018  . Chronic venous stasis 05/14/2018  . Plantar callus 08/13/2018  . Vertebral artery dissection ([place]) 09/01/2018  . Current smoker 09/01/2018   Resolved Ambulatory Problems    Diagnosis Date Noted  . Acute stress reaction 06/01/2015  . Seroma, post-traumatic ([place]) 05/13/2017  . Cellulitis of left lower leg 08/04/2017  . Wound of left leg 08/04/2017   Past Medical History:  Diagnosis Date  . Back disorder   . Knee gives out   . Other disorders of continuity of bone, right pelvic region and thigh   . Stroke [facility])        Review of Systems See HPi.     Objective:   Physical Exam Vitals signs reviewed.  Constitutional:      Appearance: She is well-developed.  HENT:     Head: Normocephalic.  Eyes:     General: No visual field deficit.    Extraocular Movements: Extraocular movements intact.     Pupils: Pupils are equal, round, and reactive to light.  Neck:     Musculoskeletal: Normal range of motion and neck supple.  Cardiovascular:     Rate and Rhythm: Normal rate and regular rhythm.     Heart sounds: Murmur present.  Pulmonary:  Effort: Pulmonary effort is normal.     Breath sounds: Normal breath sounds.  Neurological:     Mental Status: She is alert and oriented to person, place, and time.     Cranial Nerves: No cranial nerve deficit or facial asymmetry.     Sensory: No sensory deficit.     Motor: No weakness.     Coordination: Romberg sign negative. Coordination normal.     Gait: Gait normal.     Deep Tendon Reflexes: Reflexes normal.   Psychiatric:        Mood and Affect: Mood normal.           Assessment & Plan:  Marland KitchenMarland KitchenTayelor was seen today for ear pain and headache.  Diagnoses and all orders for this visit:  Other headache syndrome -     amoxicillin-clavulanate (AUGMENTIN) 875-125 MG tablet; Take 1 tablet by mouth 2 (two) times daily. -     traMADol (ULTRAM) 50 MG tablet; Take 1 tablet (50 mg total) by mouth every 8 (eight) hours as needed for up to 5 days. -     rizatriptan (MAXALT) 10 MG tablet; Take 1 tablet (10 mg total) by mouth as needed for migraine. May repeat in 2 hours if needed   More concerned with headache today due to patients hx of aneurysm and vertebral dissection, uncontrolled BP and the fact that she smokes daily. She has no neuro findings. I think related to BP and no caffeine. BP did not come down in office. Go ahead and start augmentin for potential sinus infection. Hx of migraine. Take one maxalt. Tramadol to use with tylenol for headache relief. Do not feel comfortable giving steroid or Toradol with BP readings like that. She does not have a hx of BP readings that elevated. Will not start anything today but needs to follow up in 3 days with new BP. Pt aware if she has any vision changes, worsening headache, confusion, speech issues, to go to ED. Do not stop caffeine cold [place].   Marland Kitchen.Spent 30 minutes with patient and greater than 50 percent of visit spent counseling patient regarding treatment plan.

## 2019-02-01 ENCOUNTER — Telehealth: Payer: Self-pay | Admitting: Physician Assistant

## 2019-02-01 NOTE — Telephone Encounter (Signed)
Patient states when she wakes up in the AM headache is present a small amount, but medication is helping and overall she is feeling much better.  She has not been anywhere to have blood pressure checked. When she is able to check BP she will call us with readings.   [place].

## 2019-02-01 NOTE — Telephone Encounter (Signed)
Can we call patient and find out how headache is today and also if she has been able to recheck BP readings?

## 2019-02-03 ENCOUNTER — Telehealth: Payer: Self-pay | Admitting: Neurology

## 2019-02-03 DIAGNOSIS — M5136 Other intervertebral disc degeneration, lumbar region: Secondary | ICD-10-CM | POA: Diagnosis not present

## 2019-02-03 DIAGNOSIS — Z79899 Other long term (current) drug therapy: Secondary | ICD-10-CM | POA: Diagnosis not present

## 2019-02-03 MED ORDER — HYDROCHLOROTHIAZIDE 12.5 MG PO TABS: 12.5000 mg | ORAL_TABLET | Freq: Every day | ORAL | 1 refills | Status: DC

## 2019-02-03 NOTE — Progress Notes (Unsigned)
Subjective:   Sophia Smith is a 58 y.o. female who presents for an Initial Medicare Annual Wellness Visit.  Review of Systems    No ROS.  Medicare Wellness Virtual Visit.  Visual/audio telehealth visit, UTA vital signs.   See social history for additional risk factors.       Sleep patterns:    Home Safety/Smoke Alarms: Feels safe in home. Smoke alarms in place.  Living environment;  Seat Belt Safety/Bike Helmet: Wears seat belt.   Female:   Pap-   UTD    Mammo- due      Dexa scan- not eligible due to age       75-  UTD      Objective:    There were no vitals filed for this visit. There is no height or weight on file to calculate BMI.  Advanced Directives 11/06/2017 10/10/2017 10/03/2017 09/24/2017 01/12/2014  Does Patient Have a Medical Advance Directive? Yes Yes Yes Yes Patient has advance directive, copy not in chart  Type of Advance Directive [place];Living will - [place];Living will Living will Living will;Healthcare Power of Attorney  Does patient want to make changes to medical advance directive? - No - Patient declined No - Patient declined - -  Copy of [place] in Chart? - - No - copy requested - -  Would patient like information on creating a medical advance directive? - - No - Patient declined - -    Current Medications (verified) Outpatient Encounter Medications as of 02/08/2019  Medication Sig  . amitriptyline (ELAVIL) 50 MG tablet One half tab PO qHS for a week, then one tab PO qHS. (Patient taking differently: Take 50 mg by mouth at bedtime. )  . amoxicillin-clavulanate (AUGMENTIN) 875-125 MG tablet Take 1 tablet by mouth 2 (two) times daily.  . cyclobenzaprine (FLEXERIL) 10 MG tablet Take 1 tablet (10 mg total) by mouth at bedtime.  . diclofenac sodium (VOLTAREN) 1 % GEL Apply 4 g topically 4 (four) times daily.  . DULoxetine (CYMBALTA) 60 MG capsule Take 1 capsule (60 mg total) by mouth 2 (two)  times daily.  . furosemide (LASIX) 40 MG tablet Take 1 tablet (40 mg total) by mouth daily. As needed for lower extremity edema.  . gabapentin (NEURONTIN) 800 MG tablet Take 800 mg by mouth 3 (three) times daily.  . hydrochlorothiazide (HYDRODIURIL) 12.5 MG tablet Take 1 tablet (12.5 mg total) by mouth daily.  . pantoprazole (PROTONIX) 40 MG tablet TAKE ONE TABLET BY MOUTH DAILY  . rizatriptan (MAXALT) 10 MG tablet Take 1 tablet (10 mg total) by mouth as needed for migraine. May repeat in 2 hours if needed  . [EXPIRED] traMADol (ULTRAM) 50 MG tablet Take 1 tablet (50 mg total) by mouth every 8 (eight) hours as needed for up to 5 days.   No facility-administered encounter medications on file as of 02/08/2019.     Allergies (verified) Morphine and related and Nsaids   History: Past Medical History:  Diagnosis Date  . Back disorder   . Cellulitis of left lower leg 08/04/2017  . Fibromyalgia   . Knee gives out   . Other disorders of continuity of bone, right pelvic region and thigh   . Stroke [facility])    Past Surgical History:  Procedure Laterality Date  . CHOLECYSTECTOMY    . LEG SURGERY Bilateral    numerous leg surgeries post accident age 18  . SIGMOID RESECTION / RECTOPEXY  Family History  Problem Relation Age of Onset  . Heart attack Father   . Cancer Father    Social History   Socioeconomic History  . Marital status: Legally Separated    Spouse name: Not on file  . Number of children: Not on file  . Years of education: Not on file  . Highest education level: Not on file  Occupational History  . Not on file  Social Needs  . Financial resource strain: Not on file  . Food insecurity    Worry: Not on file    Inability: Not on file  . Transportation needs    Medical: Not on file    Non-medical: Not on file  Tobacco Use  . Smoking status: Current Every Day Smoker    Packs/day: 0.50    Types: Cigarettes    Last attempt to quit: 08/23/2013    Years since quitting: 5.4   . Smokeless tobacco: Never Used  Substance and Sexual Activity  . Alcohol use: No  . Drug use: No  . Sexual activity: Yes  Lifestyle  . Physical activity    Days per week: Not on file    Minutes per session: Not on file  . Stress: Not on file  Relationships  . Social Herbalist on phone: Not on file    Gets together: Not on file    Attends religious service: Not on file    Active member of club or organization: Not on file    Attends meetings of clubs or organizations: Not on file    Relationship status: Not on file  Other Topics Concern  . Not on file  Social History Narrative  . Not on file    Tobacco Counseling Ready to quit: Not Answered Counseling given: Not Answered   Clinical Intake:                        Activities of Daily Living No flowsheet data found.   Immunizations and Health Maintenance Immunization History  Administered Date(s) Administered  . Influenza Whole 05/02/2018  . Influenza,inj,Quad PF,6+ Mos 04/04/2014, 05/15/2016, 04/28/2017  . Influenza-Unspecified 05/01/2012, 05/15/2016  . PPD Test 10/03/2009  . Td 08/05/2008  . Tdap 02/21/2014   Health Maintenance Due  Topic Date Due  . Hepatitis C Screening  October 25, 1960  . HIV Screening  10/28/1976  . PAP SMEAR-Modifier  10/29/1982  . MAMMOGRAM  10/29/2011    Patient Care Team: Lavada Mesi as PCP - General (Family Medicine) Awilda Metro, PA-C as Consulting Physician (Pain Medicine)  Indicate any recent Medical Services you may have received from other than [facility] providers in the past year (date may be approximate).     Assessment:   This is a routine wellness examination for Sophia Smith.Physical assessment deferred to PCP.   Hearing/Vision screen No exam data present  Dietary issues and exercise activities discussed:   Diet Breakfast: Lunch:  Dinner:       Goals   None    Depression Screen PHQ 2/9 Scores 01/29/2019 03/10/2018 08/10/2017  06/13/2017  PHQ - 2 Score 2 5 1 2   PHQ- 9 Score 5 12 3  -    Fall Risk No flowsheet data found.  Is the patient's home free of loose throw rugs in walkways, pet beds, electrical cords, etc?   {Blank single:19197::"yes","no"}      Grab bars in the bathroom? {Blank single:19197::"yes","no"}      Handrails on the stairs?   {  Blank single:19197::"yes","no"}      Adequate lighting?   {Blank single:19197::"yes","no"}  Cognitive Function:        Screening Tests Health Maintenance  Topic Date Due  . Hepatitis C Screening  1960/12/20  . HIV Screening  10/28/1976  . PAP SMEAR-Modifier  10/29/1982  . MAMMOGRAM  10/29/2011  . INFLUENZA VACCINE  03/06/2019  . COLONOSCOPY  11/30/2020  . TETANUS/TDAP  02/22/2024       Plan:   ***  I have personally reviewed and noted the following in the patient's chart:   . Medical and social history . Use of alcohol, tobacco or illicit drugs  . Current medications and supplements . Functional ability and status . Nutritional status . Physical activity . Advanced directives . List of other physicians . Hospitalizations, surgeries, and ER visits in previous 12 months . Vitals . Screenings to include cognitive, depression, and falls . Referrals and appointments  In addition, I have reviewed and discussed with patient certain preventive protocols, quality metrics, and best practice recommendations. A written personalized care plan for preventive services as well as general preventive health recommendations were provided to patient.     Joanne Chars, LPN   0/10/129

## 2019-02-03 NOTE — Telephone Encounter (Signed)
Patient made aware. She will start medication and call back to schedule appt to be seen in 2-4 weeks.

## 2019-02-03 NOTE — Telephone Encounter (Signed)
I sent HCTZ 12.5mg  daily recheck in 2-4 weeks in office.

## 2019-02-03 NOTE — Telephone Encounter (Signed)
Patient called back and states BP reading today is 150/100. Still has minor headache. Wondering if she needs to start blood pressure medication. Please advise. [phone].

## 2019-02-08 ENCOUNTER — Ambulatory Visit: Payer: Medicare HMO

## 2019-02-10 NOTE — Progress Notes (Deleted)
Subjective:   Sophia Smith is a 58 y.o. female who presents for an Initial Medicare Annual Wellness Visit.  Review of Systems  No ROS.  Medicare Wellness Virtual Visit.  Visual/audio telehealth visit, UTA vital signs.   See social history for additional risk factors.      Sleep patterns:    Home Safety/Smoke Alarms: Feels safe in home. Smoke alarms in place.  Living environment; Seat Belt Safety/Bike Helmet: Wears seat belt.   Female:   Pap- UTD     Mammo-       Dexa scan-  Not eligible- age      CCS-  UTD      Objective:    There were no vitals filed for this visit. There is no height or weight on file to calculate BMI.  Advanced Directives 11/06/2017 10/10/2017 10/03/2017 09/24/2017 01/12/2014  Does Patient Have a Medical Advance Directive? Yes Yes Yes Yes Patient has advance directive, copy not in chart  Type of Advance Directive [place];Living will - [place];Living will Living will Living will;Healthcare Power of Attorney  Does patient want to make changes to medical advance directive? - No - Patient declined No - Patient declined - -  Copy of [place] in Chart? - - No - copy requested - -  Would patient like information on creating a medical advance directive? - - No - Patient declined - -    Current Medications (verified) Outpatient Encounter Medications as of 02/16/2019  Medication Sig  . amitriptyline (ELAVIL) 50 MG tablet One half tab PO qHS for a week, then one tab PO qHS. (Patient taking differently: Take 50 mg by mouth at bedtime. )  . amoxicillin-clavulanate (AUGMENTIN) 875-125 MG tablet Take 1 tablet by mouth 2 (two) times daily.  . cyclobenzaprine (FLEXERIL) 10 MG tablet Take 1 tablet (10 mg total) by mouth at bedtime.  . diclofenac sodium (VOLTAREN) 1 % GEL Apply 4 g topically 4 (four) times daily.  . DULoxetine (CYMBALTA) 60 MG capsule Take 1 capsule (60 mg total) by mouth 2 (two) times daily.  .  furosemide (LASIX) 40 MG tablet Take 1 tablet (40 mg total) by mouth daily. As needed for lower extremity edema.  . gabapentin (NEURONTIN) 800 MG tablet Take 800 mg by mouth 3 (three) times daily.  . hydrochlorothiazide (HYDRODIURIL) 12.5 MG tablet Take 1 tablet (12.5 mg total) by mouth daily.  . pantoprazole (PROTONIX) 40 MG tablet TAKE ONE TABLET BY MOUTH DAILY  . rizatriptan (MAXALT) 10 MG tablet Take 1 tablet (10 mg total) by mouth as needed for migraine. May repeat in 2 hours if needed   No facility-administered encounter medications on file as of 02/16/2019.     Allergies (verified) Morphine and related and Nsaids   History: Past Medical History:  Diagnosis Date  . Back disorder   . Cellulitis of left lower leg 08/04/2017  . Fibromyalgia   . Knee gives out   . Other disorders of continuity of bone, right pelvic region and thigh   . Stroke [facility])    Past Surgical History:  Procedure Laterality Date  . CHOLECYSTECTOMY    . LEG SURGERY Bilateral    numerous leg surgeries post accident age 30  . SIGMOID RESECTION / RECTOPEXY     Family History  Problem Relation Age of Onset  . Heart attack Father   . Cancer Father    Social History   Socioeconomic History  . Marital status:  Legally Separated    Spouse name: Not on file  . Number of children: Not on file  . Years of education: Not on file  . Highest education level: Not on file  Occupational History  . Not on file  Social Needs  . Financial resource strain: Not on file  . Food insecurity    Worry: Not on file    Inability: Not on file  . Transportation needs    Medical: Not on file    Non-medical: Not on file  Tobacco Use  . Smoking status: Current Every Day Smoker    Packs/day: 0.50    Types: Cigarettes    Last attempt to quit: 08/23/2013    Years since quitting: 5.4  . Smokeless tobacco: Never Used  Substance and Sexual Activity  . Alcohol use: No  . Drug use: No  . Sexual activity: Yes  Lifestyle  .  Physical activity    Days per week: Not on file    Minutes per session: Not on file  . Stress: Not on file  Relationships  . Social Herbalist on phone: Not on file    Gets together: Not on file    Attends religious service: Not on file    Active member of club or organization: Not on file    Attends meetings of clubs or organizations: Not on file    Relationship status: Not on file  Other Topics Concern  . Not on file  Social History Narrative  . Not on file   Tobacco Counseling Ready to quit: Not Answered Counseling given: Not Answered   Clinical Intake:                       Activities of Daily Living No flowsheet data found.   Immunizations and Health Maintenance Immunization History  Administered Date(s) Administered  . Influenza Whole 05/02/2018  . Influenza,inj,Quad PF,6+ Mos 04/04/2014, 05/15/2016, 04/28/2017  . Influenza-Unspecified 05/01/2012, 05/15/2016  . PPD Test 10/03/2009  . Td 08/05/2008  . Tdap 02/21/2014   Health Maintenance Due  Topic Date Due  . Hepatitis C Screening  04-13-1961  . HIV Screening  10/28/1976  . PAP SMEAR-Modifier  10/29/1982  . MAMMOGRAM  10/29/2011    Patient Care Team: Lavada Mesi as PCP - General (Family Medicine) Awilda Metro, PA-C as Consulting Physician (Pain Medicine)  Indicate any recent Medical Services you may have received from other than [facility] providers in the past year (date may be approximate).    Assessment:   This is a routine wellness examination for Shandricka./nocpe   Hearing/Vision screen No exam data present  Dietary issues and exercise activities discussed:   Diet  Breakfast: Lunch:  Dinner:       Goals   None    Depression Screen PHQ 2/9 Scores 01/29/2019 03/10/2018 08/10/2017 06/13/2017  PHQ - 2 Score 2 5 1 2   PHQ- 9 Score 5 12 3  -    Fall Risk No flowsheet data found.  Is the patient's home free of loose throw rugs in walkways, pet beds,  electrical cords, etc?   {Blank single:19197::"yes","no"}      Grab bars in the bathroom? {Blank single:19197::"yes","no"}      Handrails on the stairs?   {Blank single:19197::"yes","no"}      Adequate lighting?   {Blank single:19197::"yes","no"}  Cognitive Function:        Screening Tests Health Maintenance  Topic Date Due  . Hepatitis C  Screening  1960/11/04  . HIV Screening  10/28/1976  . PAP SMEAR-Modifier  10/29/1982  . MAMMOGRAM  10/29/2011  . INFLUENZA VACCINE  03/06/2019  . COLONOSCOPY  11/30/2020  . TETANUS/TDAP  02/22/2024         Plan:   ***  I have personally reviewed and noted the following in the patient's chart:   . Medical and social history . Use of alcohol, tobacco or illicit drugs  . Current medications and supplements . Functional ability and status . Nutritional status . Physical activity . Advanced directives . List of other physicians . Hospitalizations, surgeries, and ER visits in previous 12 months . Vitals . Screenings to include cognitive, depression, and falls . Referrals and appointments  In addition, I have reviewed and discussed with patient certain preventive protocols, quality metrics, and best practice recommendations. A written personalized care plan for preventive services as well as general preventive health recommendations were provided to patient.     Joanne Chars, LPN   09/10/7503

## 2019-02-16 ENCOUNTER — Ambulatory Visit: Payer: Medicare HMO

## 2019-02-22 ENCOUNTER — Ambulatory Visit: Payer: Medicare HMO

## 2019-02-23 NOTE — Progress Notes (Deleted)
Subjective:   Sophia Smith is a 58 y.o. female who presents for an Initial Medicare Annual Wellness Visit.  Review of Systems    No ROS.  Medicare Wellness Virtual Visit.  Visual/audio telehealth visit, UTA vital signs.   See social history for additional risk factors.       Sleep patterns:   Home Safety/Smoke Alarms: Feels safe in home. Smoke alarms in place.  Living environment; Seat Belt Safety/Bike Helmet: Wears seat belt.   Female:   Pap-  UTD     Mammo- ordered      Dexa scan- not due- age 43       CCS- UTD     Objective:    There were no vitals filed for this visit. There is no height or weight on file to calculate BMI.  Advanced Directives 11/06/2017 10/10/2017 10/03/2017 09/24/2017 01/12/2014  Does Patient Have a Medical Advance Directive? Yes Yes Yes Yes Patient has advance directive, copy not in chart  Type of Advance Directive [place];Living will - [place];Living will Living will Living will;Healthcare Power of Attorney  Does patient want to make changes to medical advance directive? - No - Patient declined No - Patient declined - -  Copy of [place] in Chart? - - No - copy requested - -  Would patient like information on creating a medical advance directive? - - No - Patient declined - -    Current Medications (verified) Outpatient Encounter Medications as of 02/24/2019  Medication Sig  . amitriptyline (ELAVIL) 50 MG tablet One half tab PO qHS for a week, then one tab PO qHS. (Patient taking differently: Take 50 mg by mouth at bedtime. )  . amoxicillin-clavulanate (AUGMENTIN) 875-125 MG tablet Take 1 tablet by mouth 2 (two) times daily.  . cyclobenzaprine (FLEXERIL) 10 MG tablet Take 1 tablet (10 mg total) by mouth at bedtime.  . diclofenac sodium (VOLTAREN) 1 % GEL Apply 4 g topically 4 (four) times daily.  . DULoxetine (CYMBALTA) 60 MG capsule Take 1 capsule (60 mg total) by mouth 2 (two) times  daily.  . furosemide (LASIX) 40 MG tablet Take 1 tablet (40 mg total) by mouth daily. As needed for lower extremity edema.  . gabapentin (NEURONTIN) 800 MG tablet Take 800 mg by mouth 3 (three) times daily.  . hydrochlorothiazide (HYDRODIURIL) 12.5 MG tablet Take 1 tablet (12.5 mg total) by mouth daily.  . pantoprazole (PROTONIX) 40 MG tablet TAKE ONE TABLET BY MOUTH DAILY  . rizatriptan (MAXALT) 10 MG tablet Take 1 tablet (10 mg total) by mouth as needed for migraine. May repeat in 2 hours if needed   No facility-administered encounter medications on file as of 02/24/2019.     Allergies (verified) Morphine and related and Nsaids   History: Past Medical History:  Diagnosis Date  . Back disorder   . Cellulitis of left lower leg 08/04/2017  . Fibromyalgia   . Knee gives out   . Other disorders of continuity of bone, right pelvic region and thigh   . Stroke [facility])    Past Surgical History:  Procedure Laterality Date  . CHOLECYSTECTOMY    . LEG SURGERY Bilateral    numerous leg surgeries post accident age 78  . SIGMOID RESECTION / RECTOPEXY     Family History  Problem Relation Age of Onset  . Heart attack Father   . Cancer Father    Social History   Socioeconomic History  .  Marital status: Legally Separated    Spouse name: Not on file  . Number of children: Not on file  . Years of education: Not on file  . Highest education level: Not on file  Occupational History  . Not on file  Social Needs  . Financial resource strain: Not on file  . Food insecurity    Worry: Not on file    Inability: Not on file  . Transportation needs    Medical: Not on file    Non-medical: Not on file  Tobacco Use  . Smoking status: Current Every Day Smoker    Packs/day: 0.50    Types: Cigarettes    Last attempt to quit: 08/23/2013    Years since quitting: 5.5  . Smokeless tobacco: Never Used  Substance and Sexual Activity  . Alcohol use: No  . Drug use: No  . Sexual activity: Yes   Lifestyle  . Physical activity    Days per week: Not on file    Minutes per session: Not on file  . Stress: Not on file  Relationships  . Social Herbalist on phone: Not on file    Gets together: Not on file    Attends religious service: Not on file    Active member of club or organization: Not on file    Attends meetings of clubs or organizations: Not on file    Relationship status: Not on file  Other Topics Concern  . Not on file  Social History Narrative  . Not on file    Tobacco Counseling Ready to quit: Not Answered Counseling given: Not Answered   Clinical Intake:                        Activities of Daily Living No flowsheet data found.   Immunizations and Health Maintenance Immunization History  Administered Date(s) Administered  . Influenza Whole 05/02/2018  . Influenza,inj,Quad PF,6+ Mos 04/04/2014, 05/15/2016, 04/28/2017  . Influenza-Unspecified 05/01/2012, 05/15/2016  . PPD Test 10/03/2009  . Td 08/05/2008  . Tdap 02/21/2014   Health Maintenance Due  Topic Date Due  . Hepatitis C Screening  1961/01/11  . HIV Screening  10/28/1976  . PAP SMEAR-Modifier  10/29/1982  . MAMMOGRAM  10/29/2011    Patient Care Team: Lavada Mesi as PCP - General (Family Medicine) Awilda Metro, PA-C as Consulting Physician (Pain Medicine)  Indicate any recent Medical Services you may have received from other than [facility] providers in the past year (date may be approximate).     Assessment:   This is a routine wellness examination for Imo[place]an.Physical assessment deferred to PCP.   Hearing/Vision screen No exam data present  Dietary issues and exercise activities discussed:   Diet  Breakfast: Lunch:  Dinner:       Goals   None    Depression Screen PHQ 2/9 Scores 01/29/2019 03/10/2018 08/10/2017 06/13/2017  PHQ - 2 Score 2 5 1 2   PHQ- 9 Score 5 12 3  -    Fall Risk No flowsheet data found.  Is the patient's home  free of loose throw rugs in walkways, pet beds, electrical cords, etc?   {Blank single:19197::"yes","no"}      Grab bars in the bathroom? {Blank single:19197::"yes","no"}      Handrails on the stairs?   {Blank single:19197::"yes","no"}      Adequate lighting?   {Blank single:19197::"yes","no"}  Cognitive Function:        Screening Tests Health  Maintenance  Topic Date Due  . Hepatitis C Screening  [birth year]  . HIV Screening  10/28/1976  . PAP SMEAR-Modifier  10/29/1982  . MAMMOGRAM  10/29/2011  . INFLUENZA VACCINE  03/06/2019  . COLONOSCOPY  11/30/2020  . TETANUS/TDAP  02/22/2024       Plan:   ***  I have personally reviewed and noted the following in the patient's chart:   . Medical and social history . Use of alcohol, tobacco or illicit drugs  . Current medications and supplements . Functional ability and status . Nutritional status . Physical activity . Advanced directives . List of other physicians . Hospitalizations, surgeries, and ER visits in previous 12 months . Vitals . Screenings to include cognitive, depression, and falls . Referrals and appointments  In addition, I have reviewed and discussed with patient certain preventive protocols, quality metrics, and best practice recommendations. A written personalized care plan for preventive services as well as general preventive health recommendations were provided to patient.     Joanne Chars, LPN   4/80/1655

## 2019-02-24 ENCOUNTER — Telehealth: Payer: Self-pay | Admitting: Neurology

## 2019-02-24 ENCOUNTER — Ambulatory Visit: Payer: Medicare HMO

## 2019-02-24 NOTE — Telephone Encounter (Signed)
Patient cancelled her visit today due to headache, but called with updated blood pressure readings:  164/108 146/106 170/102  She is taking HCTZ daily. Please advise. [phone].

## 2019-02-25 ENCOUNTER — Other Ambulatory Visit: Payer: Self-pay | Admitting: Physician Assistant

## 2019-02-25 DIAGNOSIS — M797 Fibromyalgia: Secondary | ICD-10-CM

## 2019-02-25 DIAGNOSIS — F3342 Major depressive disorder, recurrent, in full remission: Secondary | ICD-10-CM

## 2019-02-25 MED ORDER — AMLODIPINE BESYLATE 5 MG PO TABS: 5.0000 mg | ORAL_TABLET | Freq: Every day | ORAL | 0 refills | Status: DC

## 2019-02-25 NOTE — Telephone Encounter (Signed)
Should she remain on HCTZ?

## 2019-02-25 NOTE — Telephone Encounter (Signed)
Patient made aware. Appt made.  

## 2019-02-25 NOTE — Telephone Encounter (Signed)
I sent norvasc 5mg  to take once a day. Come in on Tuesday for appt to see me and recheck BP and adjust medications as needed.

## 2019-02-25 NOTE — Telephone Encounter (Signed)
Yes

## 2019-03-02 ENCOUNTER — Other Ambulatory Visit: Payer: Self-pay

## 2019-03-02 ENCOUNTER — Encounter: Payer: Self-pay | Admitting: Physician Assistant

## 2019-03-02 ENCOUNTER — Ambulatory Visit (INDEPENDENT_AMBULATORY_CARE_PROVIDER_SITE_OTHER): Payer: Medicare HMO | Admitting: Physician Assistant

## 2019-03-02 VITALS — BP 136/88 | HR 98 | Temp 99.2°F | Ht 69.0 in | Wt 178.0 lb

## 2019-03-02 DIAGNOSIS — Z13220 Encounter for screening for lipoid disorders: Secondary | ICD-10-CM

## 2019-03-02 DIAGNOSIS — D509 Iron deficiency anemia, unspecified: Secondary | ICD-10-CM | POA: Diagnosis not present

## 2019-03-02 DIAGNOSIS — Z131 Encounter for screening for diabetes mellitus: Secondary | ICD-10-CM

## 2019-03-02 DIAGNOSIS — I10 Essential (primary) hypertension: Secondary | ICD-10-CM

## 2019-03-02 DIAGNOSIS — F3342 Major depressive disorder, recurrent, in full remission: Secondary | ICD-10-CM

## 2019-03-02 DIAGNOSIS — M797 Fibromyalgia: Secondary | ICD-10-CM | POA: Diagnosis not present

## 2019-03-02 DIAGNOSIS — E785 Hyperlipidemia, unspecified: Secondary | ICD-10-CM | POA: Diagnosis not present

## 2019-03-02 DIAGNOSIS — R69 Illness, unspecified: Secondary | ICD-10-CM | POA: Diagnosis not present

## 2019-03-02 MED ORDER — HYDROCHLOROTHIAZIDE 12.5 MG PO TABS: 12.5000 mg | ORAL_TABLET | Freq: Every day | ORAL | 1 refills | Status: DC

## 2019-03-02 MED ORDER — AMLODIPINE BESYLATE 5 MG PO TABS: 5.0000 mg | ORAL_TABLET | Freq: Every day | ORAL | 1 refills | Status: DC

## 2019-03-02 MED ORDER — DULOXETINE HCL 60 MG PO CPEP: 60.0000 mg | ORAL_CAPSULE | Freq: Two times a day (BID) | ORAL | 5 refills | Status: DC

## 2019-03-02 NOTE — Progress Notes (Signed)
Subjective:    Patient ID: Sophia Smith, female    DOB: August 02, 1961, 58 y.o.   MRN: [id number]  HPI  Pt is a 58 yo female with HTN, fibromyaglia, chronic pain, MDD who presents to the clinic to follow up on blood pressure. She had been seen recently and BP ith headache. Started HCtZ and then norvasc. She is doing much better. Headache has resolved. No SOB, CP, palpitations, chest tightness. She continues to be in daily pain. She sees pain clinic. She does feel like cymbalta has helped a lot with pain and mood.   .. Active Ambulatory Problems    Diagnosis Date Noted  . Lymphedema of leg 09/13/2013  . Pneumothorax on left 09/13/2013  . Left rib fracture 09/13/2013  . Fibromyalgia 09/13/2013  . Lumbar disc disease 09/13/2013  . Aneurysm artery, neck ([place]) 09/15/2013  . Pelvic fracture ([place]) 09/15/2013  . Depression 09/15/2013  . Elevated alkaline phosphatase level 10/15/2013  . Bilateral lower extremity edema 10/26/2013  . Left knee pain 10/26/2013  . Tricompartment degenerative joint disease of knee 10/26/2013  . Plantar fasciitis, bilateral 03/21/2014  . S/P total knee replacement 09/13/2014  . Left ankle pain 09/13/2014  . Insomnia secondary to chronic pain 06/01/2015  . Upper back pain on right side 06/01/2015  . Internal hemorrhoid 12/05/2015  . Iron deficiency anemia 05/13/2017  . Drug-seeking behavior 09/19/2017  . Essential hypertension 09/19/2017  . Opioid dependence with withdrawal ([place]) 09/19/2017  . Chronic bilateral low back pain with bilateral sciatica 09/19/2017  . Cigarette nicotine dependence without complication 70/35/0093  . Lumbar spondylolysis 09/23/2017  . Mitral regurgitation 10/22/2017  . Tricuspid regurgitation 10/22/2017  . Left atrial enlargement 10/22/2017  . Bilateral cataracts 11/04/2017  . Disorder of refraction 11/04/2017  . Thumb pain, left 03/22/2018  . Extensor carpi radialis brevis tenosynovitis 03/22/2018  . Non-intractable vomiting  with nausea 03/22/2018  . Hiatal hernia 03/22/2018  . Chronic venous stasis 05/14/2018  . Plantar callus 08/13/2018  . Vertebral artery dissection ([place]) 09/01/2018  . Current smoker 09/01/2018   Resolved Ambulatory Problems    Diagnosis Date Noted  . Acute stress reaction 06/01/2015  . Seroma, post-traumatic ([place]) 05/13/2017  . Cellulitis of left lower leg 08/04/2017  . Wound of left leg 08/04/2017   Past Medical History:  Diagnosis Date  . Back disorder   . Knee gives out   . Other disorders of continuity of bone, right pelvic region and thigh   . Stroke [facility])        Review of Systems See HPI>     Objective:   Physical Exam Vitals signs reviewed.  Constitutional:      Appearance: Normal appearance. She is obese.  Cardiovascular:     Rate and Rhythm: Normal rate and regular rhythm.     Pulses: Normal pulses.  Pulmonary:     Effort: Pulmonary effort is normal.     Breath sounds: Normal breath sounds.  Skin:    Comments: No lower extremity edema.   Neurological:     General: No focal deficit present.     Mental Status: She is alert and oriented to person, place, and time.  Psychiatric:        Mood and Affect: Mood normal.           Assessment & Plan:  Marland KitchenMarland KitchenHiyab was seen today for hypertension.  Diagnoses and all orders for this visit:  Essential hypertension -     amLODipine (NORVASC) 5 MG tablet; Take 1  tablet (5 mg total) by mouth daily. -     hydrochlorothiazide (HYDRODIURIL) 12.5 MG tablet; Take 1 tablet (12.5 mg total) by mouth daily.  Recurrent major depressive disorder, in full remission ([place]) -     DULoxetine (CYMBALTA) 60 MG capsule; Take 1 capsule (60 mg total) by mouth 2 (two) times daily.  Fibromyalgia -     DULoxetine (CYMBALTA) 60 MG capsule; Take 1 capsule (60 mg total) by mouth 2 (two) times daily.  Screening for lipid disorders -     Lipid Panel w/reflex Direct LDL  Screening for diabetes mellitus -     COMPLETE METABOLIC PANEL  WITH GFR  Iron deficiency anemia, unspecified iron deficiency anemia type -     Fe+TIBC+Fer   BP looks much better. Continue on same medications. Continue to monitor at home and call with any increase in headaches or start of CP/tightness. Goal for age is under 150/90 due to not DM or CKD. Will recheck kidney today. Pt in need of lab work. Follow up in 3 months.   Pt doing well on cymbalta. Refilled.

## 2019-03-03 ENCOUNTER — Encounter: Payer: Self-pay | Admitting: Physician Assistant

## 2019-03-03 DIAGNOSIS — E785 Hyperlipidemia, unspecified: Secondary | ICD-10-CM | POA: Insufficient documentation

## 2019-03-03 LAB — IRON,TIBC AND FERRITIN PANEL
%SAT: 30 % (calc) (ref 16–45)
Ferritin: 26 ng/mL (ref 16–232)
Iron: 114 ug/dL (ref 45–160)
TIBC: 384 mcg/dL (calc) (ref 250–450)

## 2019-03-03 LAB — COMPLETE METABOLIC PANEL WITH GFR
AG Ratio: 1.7 (calc) (ref 1.0–2.5)
ALT: 16 U/L (ref 6–29)
AST: 20 U/L (ref 10–35)
Albumin: 4.3 g/dL (ref 3.6–5.1)
Alkaline phosphatase (APISO): 119 U/L (ref 37–153)
BUN: 10 mg/dL (ref 7–25)
CO2: 28 mmol/L (ref 20–32)
Calcium: 9.7 mg/dL (ref 8.6–10.4)
Chloride: 104 mmol/L (ref 98–110)
Creat: 0.69 mg/dL (ref 0.50–1.05)
GFR, Est African American: 112 mL/min/{1.73_m2} (ref >=60)
GFR, Est Non African American: 97 mL/min/{1.73_m2} (ref >=60)
Globulin: 2.6 g/dL (calc) (ref 1.9–3.7)
Glucose, Bld: 97 mg/dL (ref 65–99)
Potassium: 4.2 mmol/L (ref 3.5–5.3)
Sodium: 142 mmol/L (ref 135–146)
Total Bilirubin: 0.5 mg/dL (ref 0.2–1.2)
Total Protein: 6.9 g/dL (ref 6.1–8.1)

## 2019-03-03 LAB — LIPID PANEL W/REFLEX DIRECT LDL
Cholesterol: 204 mg/dL — ABNORMAL HIGH (ref <200)
HDL: 49 mg/dL — ABNORMAL LOW (ref >=50)
LDL Cholesterol (Calc): 125 mg/dL (calc) — ABNORMAL HIGH
Non-HDL Cholesterol (Calc): 155 mg/dL (calc) — ABNORMAL HIGH (ref <130)
Total CHOL/HDL Ratio: 4.2 (calc) (ref <5.0)
Triglycerides: 187 mg/dL — ABNORMAL HIGH (ref <150)

## 2019-03-03 MED ORDER — ATORVASTATIN CALCIUM 40 MG PO TABS: 40.0000 mg | ORAL_TABLET | Freq: Every day | ORAL | 3 refills | Status: DC

## 2019-03-03 NOTE — Addendum Note (Signed)
Addended by: Donella Stade on: 03/03/2019 03:31 PM   Modules accepted: Orders

## 2019-03-03 NOTE — Progress Notes (Signed)
Call pt: cholesterol went way up from 1 year ago. Good cholesterol decreased, TG doubled, LDL almost double. What has changed? With your hx of anersym and elevated blood pressure suggestion would be to start statin.your 10 year CV risk is 9.5 anything greater than 7.5 percent is encouraged statin therapy due to [facility] Thoughts?   Kidney, liver, glucose look great. Iron stores look good.

## 2019-03-03 NOTE — Progress Notes (Signed)
Done

## 2019-03-04 DIAGNOSIS — M5136 Other intervertebral disc degeneration, lumbar region: Secondary | ICD-10-CM | POA: Diagnosis not present

## 2019-03-04 DIAGNOSIS — Z79899 Other long term (current) drug therapy: Secondary | ICD-10-CM | POA: Diagnosis not present

## 2019-03-05 ENCOUNTER — Encounter: Payer: Self-pay | Admitting: Physician Assistant

## 2019-03-10 NOTE — Progress Notes (Addendum)
Subjective:   Sophia Smith is a 58 y.o. female who presents for an Initial Medicare Annual Wellness Visit.  Review of Systems    No ROS.  Medicare Wellness Virtual Visit.  Visual/audio telehealth visit, UTA vital signs.   See social history for additional risk factors.     Cardiac Risk Factors include: advanced age (>67men, >61 women);sedentary lifestyle;smoking/ tobacco exposure;hypertension Sleep patterns: Getting 7 hours of sleep a night. Wakes up 2 times a night to void. Wakes up feeling sluggish.   Home Safety/Smoke Alarms: Feels safe in home. Smoke alarms in place.  Living environment; Lives with daughter in 1 story home no stairs at the home. SHower is a step over tub/shower combo and grab bars are in place as well as a bench in the shower. Seat Belt Safety/Bike Helmet: Wears seat belt.   Female:   Pap-  UTD     Mammo- ordered      Dexa scan- not of age       25- UTD      Objective:    Today's Vitals   03/15/19 1402  BP: 114/76  Pulse: 85  Temp: 99.4 F (37.4 C)  TempSrc: Oral  SpO2: 99%  Weight: 190 lb (86.2 kg)  Height: 5\' 9"  (1.753 m)  PainSc: 4    Body mass index is 28.06 kg/m.  Advanced Directives 03/15/2019 11/06/2017 10/10/2017 10/03/2017 09/24/2017 01/12/2014  Does Patient Have a Medical Advance Directive? No Yes Yes Yes Yes Patient has advance directive, copy not in chart  Type of Advance Directive - [place];Living will - [place];Living will Living will Living will;Healthcare Power of Attorney  Does patient want to make changes to medical advance directive? - - No - Patient declined No - Patient declined - -  Copy of [place] in Chart? - - - No - copy requested - -  Would patient like information on creating a medical advance directive? Yes (MAU/Ambulatory/Procedural Areas - Information given) - - No - Patient declined - -    Current Medications (verified) Outpatient Encounter Medications  as of 03/15/2019  Medication Sig  . amitriptyline (ELAVIL) 50 MG tablet One half tab PO qHS for a week, then one tab PO qHS. (Patient taking differently: Take 50 mg by mouth at bedtime. )  . amLODipine (NORVASC) 5 MG tablet Take 1 tablet (5 mg total) by mouth daily.  Marland Kitchen atorvastatin (LIPITOR) 40 MG tablet Take 1 tablet (40 mg total) by mouth daily.  . diclofenac sodium (VOLTAREN) 1 % GEL Apply 4 g topically 4 (four) times daily.  . DULoxetine (CYMBALTA) 60 MG capsule Take 1 capsule (60 mg total) by mouth 2 (two) times daily.  . furosemide (LASIX) 40 MG tablet Take 1 tablet (40 mg total) by mouth daily. As needed for lower extremity edema.  . gabapentin (NEURONTIN) 800 MG tablet Take 800 mg by mouth 3 (three) times daily.  . hydrochlorothiazide (HYDRODIURIL) 12.5 MG tablet Take 1 tablet (12.5 mg total) by mouth daily.  Marland Kitchen oxyCODONE-acetaminophen (PERCOCET) 10-325 MG tablet Take 1 tablet by mouth every 4 (four) hours as needed for pain.  . pantoprazole (PROTONIX) 40 MG tablet TAKE ONE TABLET BY MOUTH DAILY  . cyclobenzaprine (FLEXERIL) 10 MG tablet Take 1 tablet (10 mg total) by mouth at bedtime. (Patient not taking: Reported on 03/15/2019)   No facility-administered encounter medications on file as of 03/15/2019.     Allergies (verified) Morphine and related and Nsaids  History: Past Medical History:  Diagnosis Date  . Back disorder   . Cellulitis of left lower leg 08/04/2017  . Fibromyalgia   . Knee gives out   . Other disorders of continuity of bone, right pelvic region and thigh   . Stroke [facility])    Past Surgical History:  Procedure Laterality Date  . CHOLECYSTECTOMY    . LEG SURGERY Bilateral    numerous leg surgeries post accident age 35  . SIGMOID RESECTION / RECTOPEXY     Family History  Problem Relation Age of Onset  . Heart attack Father   . Cancer Father    Social History   Socioeconomic History  . Marital status: Divorced    Spouse name: Not on file  . Number of  children: 1  . Years of education: 35  . Highest education level: Associate degree: academic program  Occupational History  . 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 Financial resource strain: Not hard at all  . Food insecurity    Worry: Never true    Inability: Never true  . Transportation needs    Medical: No    Non-medical: No  Tobacco Use  . Smoking status: Current Every Day Smoker    Packs/day: 0.50    Years: 40.00    Pack years: 20.00    Types: Cigarettes    Last attempt to quit: 08/23/2013    Years since quitting: 5.5  . Smokeless tobacco: Never Used  Substance and Sexual Activity  . Alcohol use: No  . Drug use: No  . Sexual activity: Not Currently  Lifestyle  . Physical activity    Days per week: 0 days    Minutes per session: 0 min  . Stress: Not at all  Relationships  . Social Herbalist on phone: More than three times a week    Gets together: Twice a week    Attends religious service: Never    Active member of club or organization: No    Attends meetings of clubs or organizations: Never    Relationship status: Divorced  Other Topics Concern  . Not on file  Social History Narrative  . Not on file    Tobacco Counseling Ready to quit: No Counseling given: Not Answered   Clinical Intake:  Pre-visit preparation completed: Yes  Pain : 0-10 Pain Score: 4  Pain Type: Chronic pain Pain Location: Back Pain Orientation: Right, Left Pain Radiating Towards: down legs Pain Descriptors / Indicators: Aching, Shooting Pain Onset: More than a month ago Pain Frequency: Intermittent Pain Relieving Factors: pain meds  Pain Relieving Factors: pain meds  Nutritional Risks: None Diabetes: No  How often do you need to have someone help you when you read instructions, pamphlets, or other written materials from your doctor or pharmacy?: 1 - Never What is the last grade level you completed in school?: 14  Interpreter Needed?:  No  Information entered by :: Onalee Hua- LPN   Activities of Daily Living In your present state of health, do you have any difficulty performing the following activities: 03/15/2019  Hearing? N  Vision? N  Difficulty concentrating or making decisions? N  Walking or climbing stairs? N  Dressing or bathing? N  Doing errands, shopping? N  Preparing Food and eating ? N  Using the Toilet? N  In the past six months, have you accidently leaked urine? Y  Comment mostly at night when happens  Do you have  problems with loss of bowel control? N  Managing your Medications? N  Managing your Finances? N  Housekeeping or managing your Housekeeping? N  Some recent data might be hidden     Immunizations and Health Maintenance Immunization History  Administered Date(s) Administered  . Influenza Whole 05/02/2018  . Influenza,inj,Quad PF,6+ Mos 04/04/2014, 05/15/2016, 04/28/2017  . Influenza-Unspecified 05/01/2012, 05/15/2016  . PPD Test 10/03/2009  . Td 08/05/2008  . Tdap 02/21/2014   Health Maintenance Due  Topic Date Due  . Hepatitis C Screening  August 04, 1961  . HIV Screening  10/28/1976  . MAMMOGRAM  10/29/2011  . INFLUENZA VACCINE  03/06/2019    Patient Care Team: Lavada Mesi as PCP - General (Family Medicine) Awilda Metro, PA-C as Consulting Physician (Pain Medicine)  Indicate any recent Medical Services you may have received from other than [facility] providers in the past year (date may be approximate).     Assessment:   This is a routine wellness examination for Sophia Smith.Physical assessment deferred to PCP.   Hearing/Vision screen  Hearing Screening   125Hz  250Hz  500Hz  1000Hz  2000Hz  3000Hz  4000Hz  6000Hz  8000Hz   Right ear:           Left ear:           Comments: Hearing test done via whisper test and patient heard all three words.   Visual Acuity Screening   Right eye Left eye Both eyes  Without correction: 20/30 20/30 20/30   With correction:        Dietary issues and exercise activities discussed: Current Exercise Habits: The patient does not participate in regular exercise at present, Exercise limited by: orthopedic condition(s) Diet eats a healthy diet of fruits and vegetables. Breakfast: skips Lunch: sandwich Dinner:  Meat and vegetables or frozen dinner     Goals    . Exercise 3x per week (30 min per time)     Walk at least 3 times a week for short distance and build tolerance      Depression Screen PHQ 2/9 Scores 03/15/2019 01/29/2019 03/10/2018 08/10/2017 06/13/2017  PHQ - 2 Score 0 2 5 1 2   PHQ- 9 Score 3 5 12 3  -    Fall Risk Fall Risk  03/15/2019  Falls in the past year? 0  Injury with Fall? 0  Risk for fall due to : Impaired balance/gait  Follow up Falls prevention discussed    Is the patient's home free of loose throw rugs in walkways, pet beds, electrical cords, etc?   yes      Grab bars in the bathroom? yes      Handrails on the stairs?   no      Adequate lighting?   yes  Cognitive Function:     6CIT Screen 03/15/2019  What Year? 0 points  What month? 0 points  What time? 0 points  Count back from 20 0 points  Months in reverse 0 points  Repeat phrase 0 points  Total Score 0    Screening Tests Health Maintenance  Topic Date Due  . Hepatitis C Screening  29-Oct-1960  . HIV Screening  10/28/1976  . MAMMOGRAM  10/29/2011  . INFLUENZA VACCINE  03/06/2019  . PAP SMEAR-Modifier  09/20/2019  . COLONOSCOPY  11/30/2020  . TETANUS/TDAP  02/22/2024       Plan:    Sophia Smith , Thank you for taking time to come for your Medicare Wellness Visit. I appreciate your ongoing commitment to your health goals. Please review the  following plan we discussed and let me know if I can assist you in the future.  Please schedule your next medicare wellness visit with me in 1 yr. Bring a copy of your living will and/or healthcare power of attorney to your next office visit.   These are the goals we  discussed: Goals    . Exercise 3x per week (30 min per time)     Walk at least 3 times a week for short distance and build tolerance       This is a list of the screening recommended for you and due dates:  Health Maintenance  Topic Date Due  .  Hepatitis C: One time screening is recommended by Center for Disease Control  (CDC) for  adults born from [birth year] through 1964.   05/11/1961  . HIV Screening  10/28/1976  . Mammogram  10/29/2011  . Flu Shot  03/06/2019  . Pap Smear  09/20/2019  . Colon Cancer Screening  11/30/2020  . Tetanus Vaccine  02/22/2024     I have personally reviewed and noted the following in the patient's chart:   . Medical and social history . Use of alcohol, tobacco or illicit drugs  . Current medications and supplements . Functional ability and status . Nutritional status . Physical activity . Advanced directives . List of other physicians . Hospitalizations, surgeries, and ER visits in previous 12 months . Vitals . Screenings to include cognitive, depression, and falls . Referrals and appointments  In addition, I have reviewed and discussed with patient certain preventive protocols, quality metrics, and best practice recommendations. A written personalized care plan for preventive services as well as general preventive health recommendations were provided to patient.     Joanne Chars, LPN   [id number]    .Marland KitchenMedical screening examination/treatment was performed by qualified clinical staff member and as supervising physician I was immediately available for consultation/collaboration. I have reviewed documentation and agree with assessment and plan.  Iran Planas, PA-C

## 2019-03-15 ENCOUNTER — Ambulatory Visit (INDEPENDENT_AMBULATORY_CARE_PROVIDER_SITE_OTHER): Payer: Medicare HMO | Admitting: *Deleted

## 2019-03-15 ENCOUNTER — Other Ambulatory Visit: Payer: Self-pay

## 2019-03-15 VITALS — BP 114/76 | HR 85 | Temp 99.4°F | Ht 69.0 in | Wt 190.0 lb

## 2019-03-15 DIAGNOSIS — Z0000 Encounter for general adult medical examination without abnormal findings: Secondary | ICD-10-CM

## 2019-03-15 NOTE — Patient Instructions (Addendum)
Ms. Pennywell , Thank you for taking time to come for your Medicare Wellness Visit. I appreciate your ongoing commitment to your health goals. Please review the following plan we discussed and let me know if I can assist you in the future.  Please schedule your next medicare wellness visit with me in 1 yr. Bring a copy of your living will and/or healthcare power of attorney to your next office visit. These are the goals we discussed: Goals    . Exercise 3x per week (30 min per time)     Walk at least 3 times a week for short distance and build tolerance

## 2019-03-27 ENCOUNTER — Other Ambulatory Visit: Payer: Self-pay | Admitting: Physician Assistant

## 2019-03-27 DIAGNOSIS — M519 Unspecified thoracic, thoracolumbar and lumbosacral intervertebral disc disorder: Secondary | ICD-10-CM

## 2019-03-27 DIAGNOSIS — G8929 Other chronic pain: Secondary | ICD-10-CM

## 2019-03-27 DIAGNOSIS — F3342 Major depressive disorder, recurrent, in full remission: Secondary | ICD-10-CM

## 2019-03-27 DIAGNOSIS — M797 Fibromyalgia: Secondary | ICD-10-CM

## 2019-03-27 DIAGNOSIS — M4306 Spondylolysis, lumbar region: Secondary | ICD-10-CM

## 2019-04-01 DIAGNOSIS — M5136 Other intervertebral disc degeneration, lumbar region: Secondary | ICD-10-CM | POA: Diagnosis not present

## 2019-04-01 DIAGNOSIS — G8929 Other chronic pain: Secondary | ICD-10-CM | POA: Diagnosis not present

## 2019-04-01 DIAGNOSIS — Z79899 Other long term (current) drug therapy: Secondary | ICD-10-CM | POA: Diagnosis not present

## 2019-04-01 DIAGNOSIS — M5416 Radiculopathy, lumbar region: Secondary | ICD-10-CM | POA: Diagnosis not present

## 2019-04-30 DIAGNOSIS — M5416 Radiculopathy, lumbar region: Secondary | ICD-10-CM | POA: Diagnosis not present

## 2019-04-30 DIAGNOSIS — Z79891 Long term (current) use of opiate analgesic: Secondary | ICD-10-CM | POA: Diagnosis not present

## 2019-04-30 DIAGNOSIS — G8929 Other chronic pain: Secondary | ICD-10-CM | POA: Diagnosis not present

## 2019-04-30 DIAGNOSIS — Z79899 Other long term (current) drug therapy: Secondary | ICD-10-CM | POA: Diagnosis not present

## 2019-05-01 DIAGNOSIS — R69 Illness, unspecified: Secondary | ICD-10-CM | POA: Diagnosis not present

## 2019-05-28 DIAGNOSIS — G8929 Other chronic pain: Secondary | ICD-10-CM | POA: Diagnosis not present

## 2019-05-28 DIAGNOSIS — Z79891 Long term (current) use of opiate analgesic: Secondary | ICD-10-CM | POA: Diagnosis not present

## 2019-05-28 DIAGNOSIS — Z79899 Other long term (current) drug therapy: Secondary | ICD-10-CM | POA: Diagnosis not present

## 2019-05-28 DIAGNOSIS — M5416 Radiculopathy, lumbar region: Secondary | ICD-10-CM | POA: Diagnosis not present

## 2019-06-06 IMAGING — MR MR LUMBAR SPINE W/O CM
4 of 5 series · 25 of 48 positions shown · non-contrast
Comparison: None.

CLINICAL DATA: Low back pain radiating into the right hip. Right
leg weakness.

EXAM:
MRI LUMBAR SPINE WITHOUT CONTRAST
TECHNIQUE: Multiplanar, multisequence MR imaging of the lumbar spine was
performed. No intravenous contrast was administered.

[Series 2: T2 · sagittal · 4.0mm · 0.81mm/px · 6 of 15 slices shown (1 of 2)]
[im 1/15]
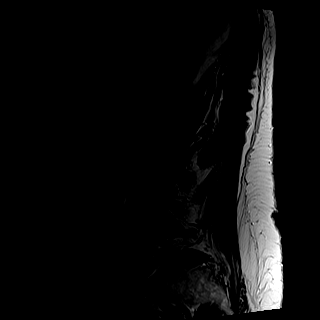
[im 3/15]
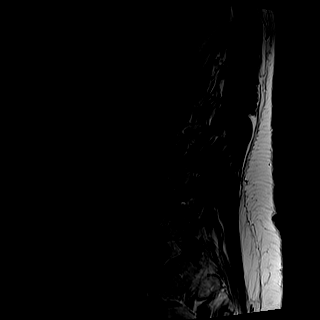
[im 6/15]
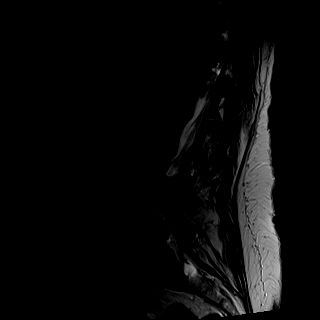
[im 9/15]
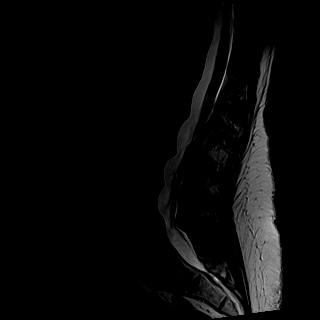
[im 12/15]
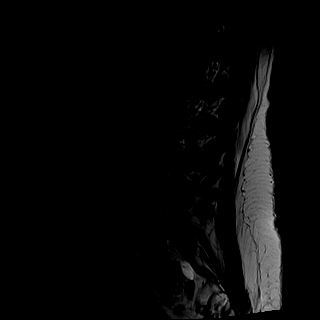
[im 15/15]
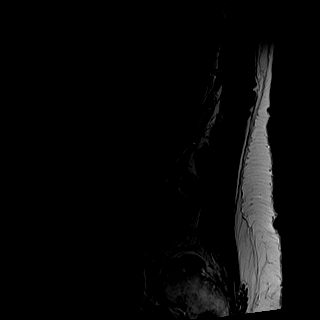

[Series 3: T1 · sagittal · 4.0mm · 0.41mm/px · 6 of 15 slices shown (1 of 2)]
[im 1/15]
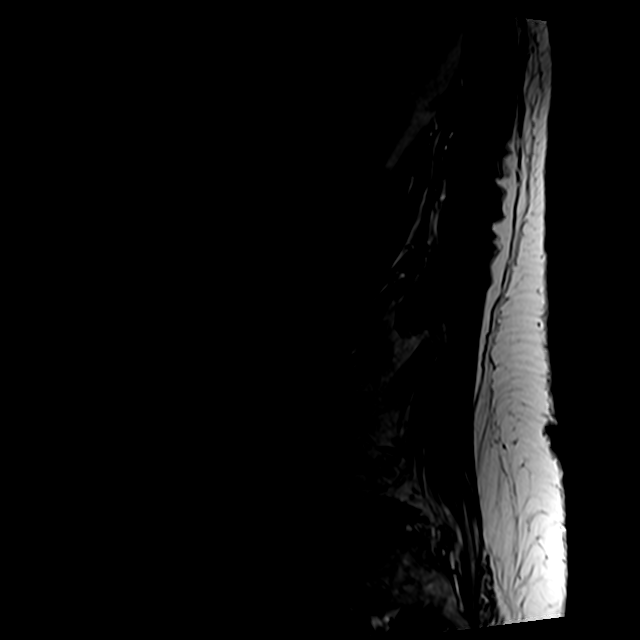
[im 3/15]
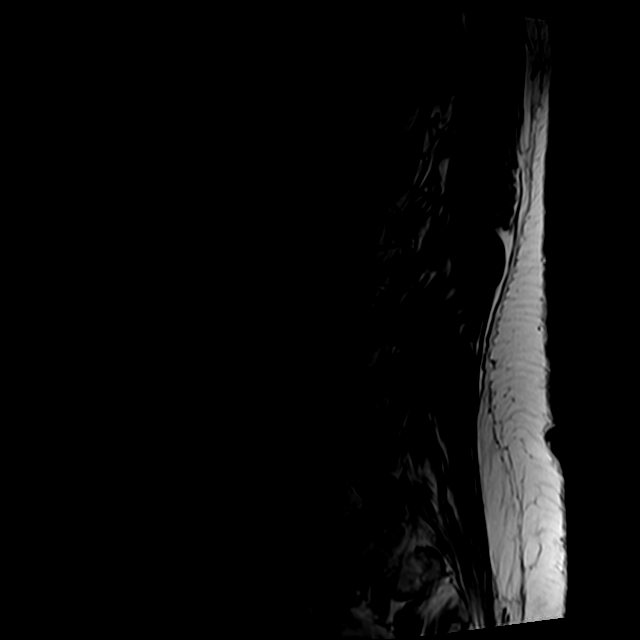
[im 6/15]
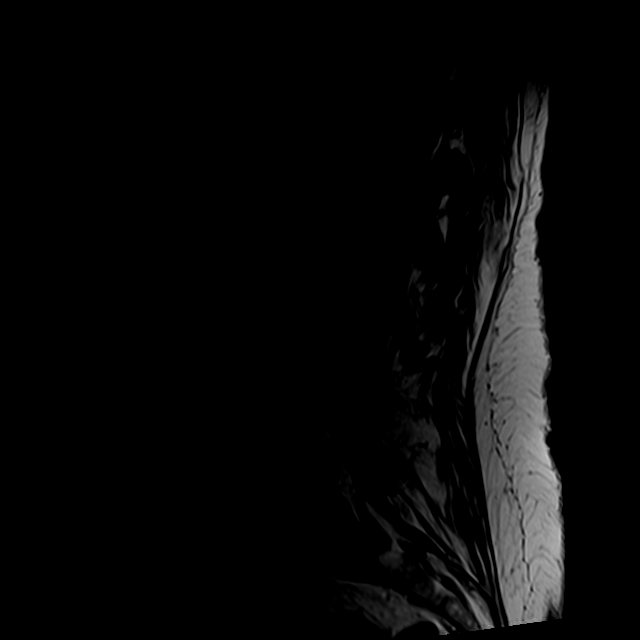
[im 9/15]
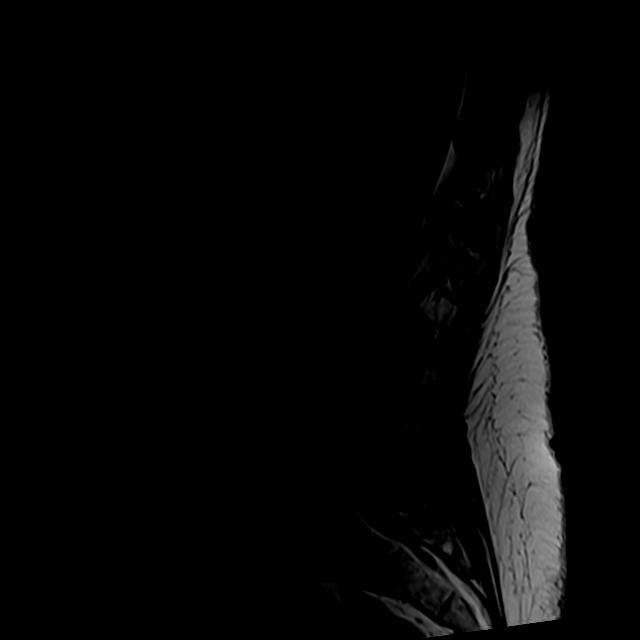
[im 12/15]
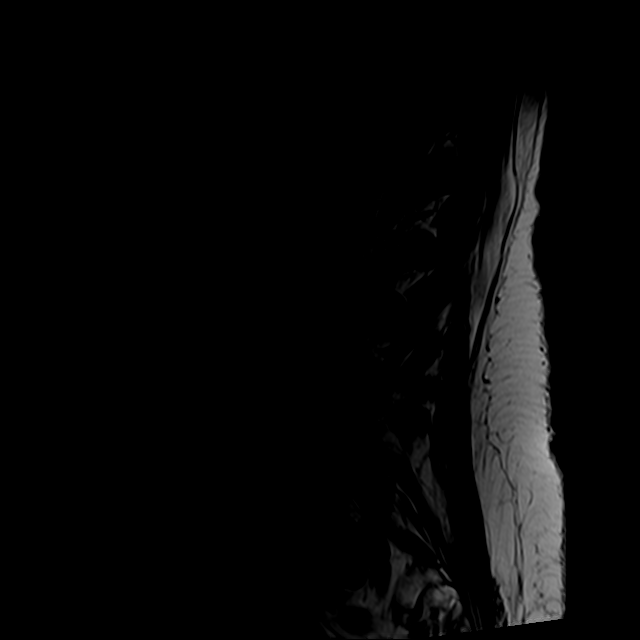
[im 15/15]
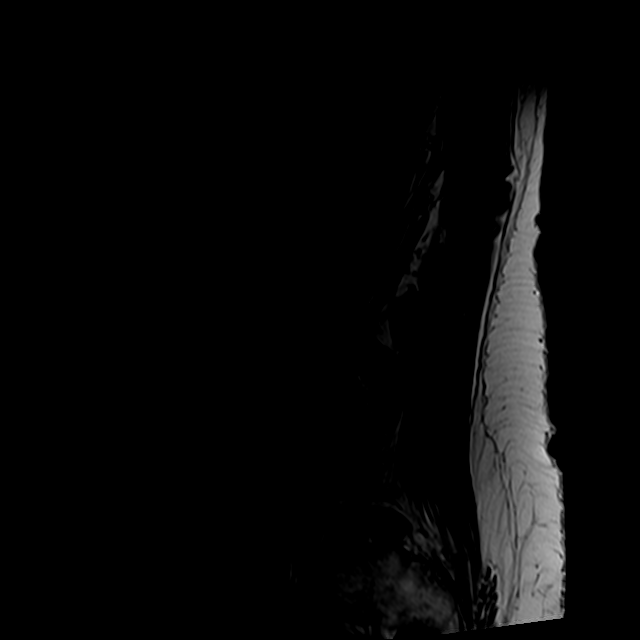

[Series 5: T2 · axial · 4.0mm · 0.78mm/px · z∈[-65,+159]mm · 9 of 38 slices shown (2 of 2)]
[im 1/38]
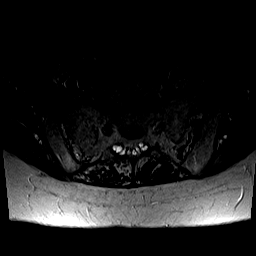
[im 6/38]
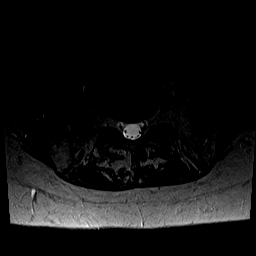
[im 11/38]
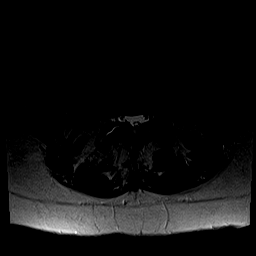
[im 16/38]
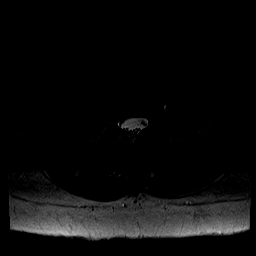
[im 19/38]
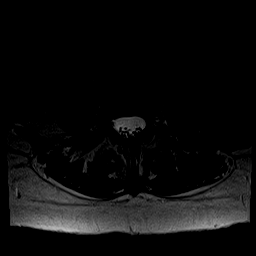
[im 22/38]
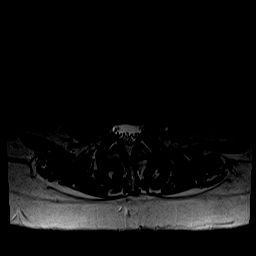
[im 27/38]
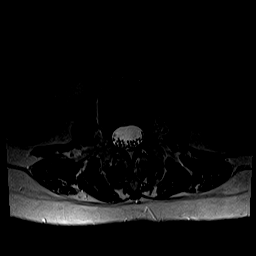
[im 32/38]
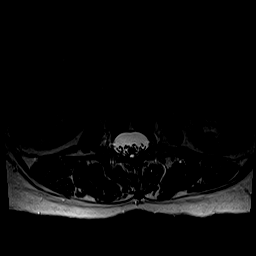
[im 38/38]
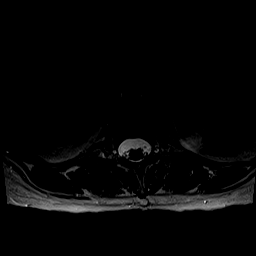

[Series 6: T1 · axial · 4.0mm · 0.39mm/px · z∈[-65,+130]mm · 4 of 38 slices shown (2 of 2)]
[im 1/38]
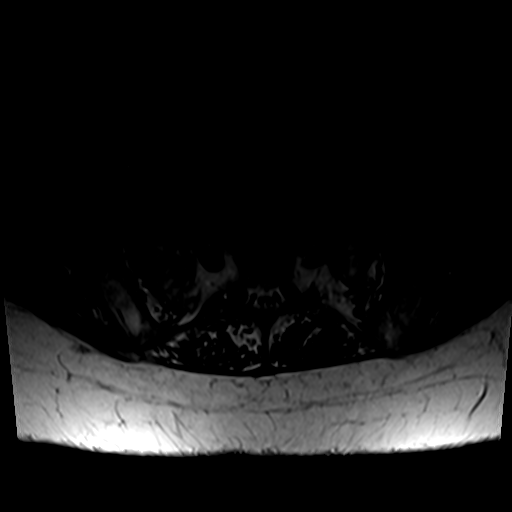
[im 6/38]
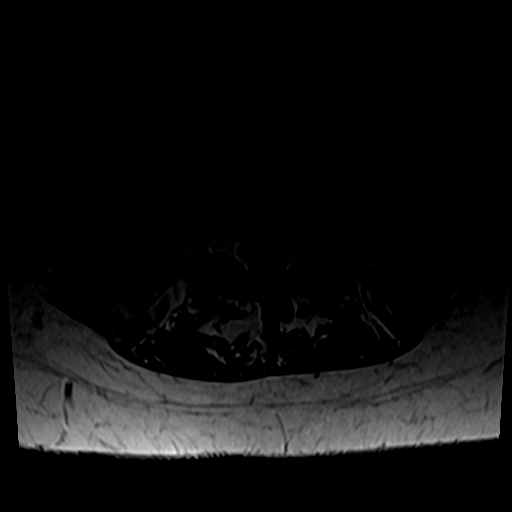
[im 19/38]
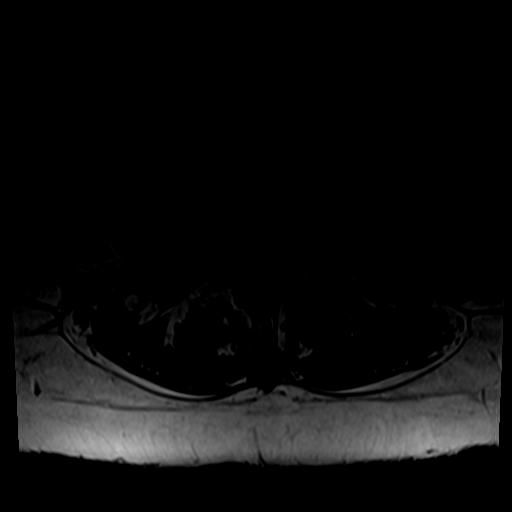
[im 32/38]
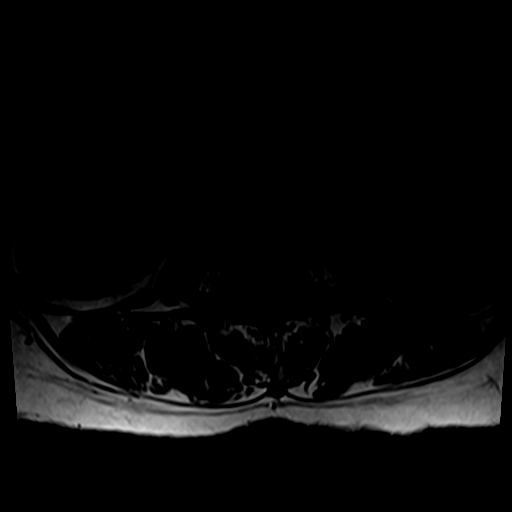

[25 of 48 positions shown; findings below may reference images not displayed]

FINDINGS: Segmentation: Normal lumbar segmentation is assumed, with the lowest
fully formed disc space designated L5-S1.

Alignment: Mild lumbar dextroscoliosis. Slight retrolisthesis of L1
on L2 and L2 on L3 and slight anterolisthesis of L4 on L5.

Vertebrae: No fracture, suspicious osseous lesion, or significant
marrow edema. Small L1 vertebral body hemangioma.

Conus medullaris: Extends to the L1-2 level and appears normal.

Paraspinal and other soft tissues: Partially visualized small T2
hyperintense lesions in both kidneys, likely cysts but incompletely
evaluated. Bilateral S1 nerve root sleeve diverticula.

Disc levels:

T12-L1:  Negative.

L1-2: Disc desiccation and mild disc space narrowing. Mild disc
bulging without stenosis.

L2-3: Disc desiccation and moderate disc space narrowing. Disc
bulging and mild facet arthrosis without stenosis.

L3-4: Disc desiccation and mild-to-moderate disc space narrowing
asymmetric to the right. Disc bulging, shallow right subarticular
disc protrusion, and mild facet arthrosis result in minimal right
lateral recess narrowing without spinal or neural foraminal stenosis
or evidence of L4 nerve root impingement.

L4-5: Mild disc desiccation. Listhesis with mild bulging of
uncovered disc, mild ligamentum flavum thickening, and moderate
facet arthrosis result in minimal right greater than left lateral
recess narrowing and borderline spinal stenosis without significant
neural foraminal stenosis. There is a right extraforaminal disc
protrusion with annular fissure which contacts the right L4 nerve
without evidence of neural compression. Trace right facet joint
effusion.

L5-S1:  Mild facet arthrosis without disc herniation or stenosis.
IMPRESSION: 1. Moderate L4-5 facet arthrosis with grade 1 anterolisthesis. Right
extraforaminal disc protrusion with annular fissure may irritate the
right L4 nerve.
2. L2-3 and L3-4 disc degeneration without evidence of neural
impingement.

## 2019-06-09 ENCOUNTER — Ambulatory Visit (INDEPENDENT_AMBULATORY_CARE_PROVIDER_SITE_OTHER): Payer: Medicare HMO | Admitting: Physician Assistant

## 2019-06-09 ENCOUNTER — Other Ambulatory Visit: Payer: Self-pay

## 2019-06-09 VITALS — BP 155/95 | HR 98 | Ht 69.0 in | Wt 189.0 lb

## 2019-06-09 DIAGNOSIS — G8929 Other chronic pain: Secondary | ICD-10-CM | POA: Diagnosis not present

## 2019-06-09 DIAGNOSIS — G4701 Insomnia due to medical condition: Secondary | ICD-10-CM | POA: Diagnosis not present

## 2019-06-09 DIAGNOSIS — M5441 Lumbago with sciatica, right side: Secondary | ICD-10-CM | POA: Diagnosis not present

## 2019-06-09 DIAGNOSIS — M519 Unspecified thoracic, thoracolumbar and lumbosacral intervertebral disc disorder: Secondary | ICD-10-CM | POA: Diagnosis not present

## 2019-06-09 DIAGNOSIS — M4306 Spondylolysis, lumbar region: Secondary | ICD-10-CM | POA: Diagnosis not present

## 2019-06-09 DIAGNOSIS — M5442 Lumbago with sciatica, left side: Secondary | ICD-10-CM | POA: Diagnosis not present

## 2019-06-09 DIAGNOSIS — I10 Essential (primary) hypertension: Secondary | ICD-10-CM

## 2019-06-09 NOTE — Patient Instructions (Signed)
Will make referral ortho for evaluation.

## 2019-06-09 NOTE — Progress Notes (Signed)
Subjective:    Patient ID: Sophia Smith, female    DOB: 08-12-1960, 58 y.o.   MRN: [id number]  HPI  Pt is a 58 yo female with HTN, chronic pain syndrome, LDD, chronic low back pain, OA who presents to the clinic to discuss pain and not sleeping. She sees a pain clinic but feels like they are not doing anything to help with sleep.   Pt can go to sleep fine but wakes up in pain 1-2 hours later. Taking a pain pill helps the most but then she does not have enough to last her the rest of the day. Pain clinic will not increase pain rx. She is on percocet, amitriptyline, gabapentin, Cymbalta.   Last MRI 2018 IMPRESSION: 1. Moderate L4-5 facet arthrosis with grade 1 anterolisthesis. Right extraforaminal disc protrusion with annular fissure may irritate the right L4 nerve. 2. L2-3 and L3-4 disc degeneration without evidence of neural Impingement.  Never had back surgery. Hx of pain stimulator but it is out now.   She has tried just tylenol at night but does not seem to help. She has tried injections and PT in past as well.   .. Active Ambulatory Problems    Diagnosis Date Noted  . Lymphedema of leg 09/13/2013  . Pneumothorax on left 09/13/2013  . Left rib fracture 09/13/2013  . Fibromyalgia 09/13/2013  . Lumbar disc disease 09/13/2013  . Aneurysm artery, neck ([place]) 09/15/2013  . Pelvic fracture ([place]) 09/15/2013  . Depression 09/15/2013  . Elevated alkaline phosphatase level 10/15/2013  . Bilateral lower extremity edema 10/26/2013  . Left knee pain 10/26/2013  . Tricompartment degenerative joint disease of knee 10/26/2013  . Plantar fasciitis, bilateral 03/21/2014  . S/P total knee replacement 09/13/2014  . Left ankle pain 09/13/2014  . Insomnia secondary to chronic pain 06/01/2015  . Upper back pain on right side 06/01/2015  . Internal hemorrhoid 12/05/2015  . Iron deficiency anemia 05/13/2017  . Drug-seeking behavior 09/19/2017  . Essential hypertension 09/19/2017  . Opioid  dependence with withdrawal ([place]) 09/19/2017  . Chronic bilateral low back pain with bilateral sciatica 09/19/2017  . Cigarette nicotine dependence without complication [id number]  . Lumbar spondylolysis 09/23/2017  . Mitral regurgitation 10/22/2017  . Tricuspid regurgitation 10/22/2017  . Left atrial enlargement 10/22/2017  . Bilateral cataracts 11/04/2017  . Disorder of refraction 11/04/2017  . Thumb pain, left 03/22/2018  . Extensor carpi radialis brevis tenosynovitis 03/22/2018  . Non-intractable vomiting with nausea 03/22/2018  . Hiatal hernia 03/22/2018  . Chronic venous stasis 05/14/2018  . Plantar callus 08/13/2018  . Vertebral artery dissection ([place]) 09/01/2018  . Current smoker 09/01/2018  . Dyslipidemia (high LDL; low HDL) 03/03/2019   Resolved Ambulatory Problems    Diagnosis Date Noted  . Acute stress reaction 06/01/2015  . Seroma, post-traumatic ([place]) 05/13/2017  . Cellulitis of left lower leg 08/04/2017  . Wound of left leg 08/04/2017   Past Medical History:  Diagnosis Date  . Back disorder   . Knee gives out   . Other disorders of continuity of bone, right pelvic region and thigh   . Stroke [facility])        Review of Systems  All other systems reviewed and are negative.      Objective:   Physical Exam Vitals signs reviewed.  Constitutional:      Appearance: Normal appearance. She is obese.  HENT:     Head: Normocephalic.  Cardiovascular:     Rate and Rhythm: Normal rate and regular  rhythm.     Heart sounds: Murmur present.  Pulmonary:     Effort: Pulmonary effort is normal.  Musculoskeletal:     Comments: Limited ROM due to pain.   Neurological:     General: No focal deficit present.     Mental Status: She is alert and oriented to person, place, and time.  Psychiatric:        Mood and Affect: Mood normal.           Assessment & Plan:  Marland KitchenMarland KitchenShelby was seen today for insomnia.  Diagnoses and all orders for this visit:  Insomnia secondary  to chronic pain  Essential hypertension  Chronic bilateral low back pain with bilateral sciatica -     Ambulatory referral to Dr. Rolena Infante, Spine Surgery  Lumbar disc disease -     Ambulatory referral to Dr. Rolena Infante, Spine Surgery  Lumbar spondylolysis -     Ambulatory referral to Dr. Rolena Infante, Spine Surgery   Pain seems to be what is keeping patient up at night. She is on multiple medications for pain and some of which should help with sleep. She is seeing pain clinic and discussed I will not do any adjusting to this medication. Will make referral to spine surgery to see if any options for her to help more with pain.

## 2019-06-14 ENCOUNTER — Encounter: Payer: Self-pay | Admitting: Physician Assistant

## 2019-06-21 DIAGNOSIS — R69 Illness, unspecified: Secondary | ICD-10-CM | POA: Diagnosis not present

## 2019-06-25 DIAGNOSIS — Z79899 Other long term (current) drug therapy: Secondary | ICD-10-CM | POA: Diagnosis not present

## 2019-06-25 DIAGNOSIS — Z79891 Long term (current) use of opiate analgesic: Secondary | ICD-10-CM | POA: Diagnosis not present

## 2019-06-25 DIAGNOSIS — M5416 Radiculopathy, lumbar region: Secondary | ICD-10-CM | POA: Diagnosis not present

## 2019-06-25 DIAGNOSIS — R69 Illness, unspecified: Secondary | ICD-10-CM | POA: Diagnosis not present

## 2019-06-25 DIAGNOSIS — G8929 Other chronic pain: Secondary | ICD-10-CM | POA: Diagnosis not present

## 2019-07-06 ENCOUNTER — Ambulatory Visit (INDEPENDENT_AMBULATORY_CARE_PROVIDER_SITE_OTHER): Payer: Medicare HMO

## 2019-07-06 ENCOUNTER — Encounter: Payer: Self-pay | Admitting: Orthopaedic Surgery

## 2019-07-06 ENCOUNTER — Ambulatory Visit: Payer: Medicare HMO | Admitting: Orthopaedic Surgery

## 2019-07-06 ENCOUNTER — Other Ambulatory Visit: Payer: Self-pay

## 2019-07-06 VITALS — BP 146/100 | HR 83 | Ht 69.0 in | Wt 180.0 lb

## 2019-07-06 DIAGNOSIS — M5136 Other intervertebral disc degeneration, lumbar region: Secondary | ICD-10-CM | POA: Diagnosis not present

## 2019-07-06 DIAGNOSIS — G8929 Other chronic pain: Secondary | ICD-10-CM | POA: Diagnosis not present

## 2019-07-06 DIAGNOSIS — M4807 Spinal stenosis, lumbosacral region: Secondary | ICD-10-CM

## 2019-07-06 DIAGNOSIS — M545 Low back pain: Secondary | ICD-10-CM | POA: Diagnosis not present

## 2019-07-06 DIAGNOSIS — M4316 Spondylolisthesis, lumbar region: Secondary | ICD-10-CM | POA: Diagnosis not present

## 2019-07-06 NOTE — Progress Notes (Signed)
Office Visit Note   Patient: Sophia Smith           Date of Birth: 1960-11-19           MRN: [id number] Visit Date: 07/06/2019              Requested by: Donella Stade, PA-C [address] [place],  [place] [address] PCP: Donella Stade, PA-C   Assessment & Plan: Visit Diagnoses:  1. Chronic bilateral low back pain, unspecified whether sciatica present   2. Other intervertebral disc degeneration, lumbar region   3. Spinal stenosis of lumbosacral region   4. Spondylolisthesis of lumbar region     Plan: Since patient has failed conservative treatment up to this point and her symptoms have been worsening I recommend repeating lumbar MRI and comparing to the study that was done in 2018.  Follow-up with Dr. Lorin Mercy after completion to discuss results and further treatment options.  No medication given today.  Follow-Up Instructions: Return in about 3 weeks (around 07/27/2019) for with dr yates to review lumbar mri.   Orders:  Orders Placed This Encounter  Procedures  . XR Lumbar Spine Complete  . MR Lumbar Spine w/o contrast   No orders of the defined types were placed in this encounter.     Procedures: No procedures performed   Clinical Data: No additional findings.   Subjective: Chief Complaint  Patient presents with  . Lower Back - Pain    HPI 58 year old white female who is new patient to [place] is being seen for worsening low back pain and right greater than left lower extremity radiculopathy.  Patient has had problems with her low back for several years.  She is currently getting chronic pain management from [place].  Patient is also been established with 2 other pain clinics.  Previous lumbar MRI from 2018 showed:  CLINICAL DATA:  Low back pain radiating into the right hip. Right leg weakness.  EXAM: MRI LUMBAR SPINE WITHOUT CONTRAST  TECHNIQUE: Multiplanar, multisequence MR imaging of the lumbar spine was performed. No intravenous  contrast was administered.  COMPARISON:  None.  FINDINGS: Segmentation: Normal lumbar segmentation is assumed, with the lowest fully formed disc space designated L5-S1.  Alignment: Mild lumbar dextroscoliosis. Slight retrolisthesis of L1 on L2 and L2 on L3 and slight anterolisthesis of L4 on L5.  Vertebrae: No fracture, suspicious osseous lesion, or significant marrow edema. Small L1 vertebral body hemangioma.  Conus medullaris: Extends to the L1-2 level and appears normal.  Paraspinal and other soft tissues: Partially visualized small T2 hyperintense lesions in both kidneys, likely cysts but incompletely evaluated. Bilateral S1 nerve root sleeve diverticula.  Disc levels:  T12-L1:  Negative.  L1-2: Disc desiccation and mild disc space narrowing. Mild disc bulging without stenosis.  L2-3: Disc desiccation and moderate disc space narrowing. Disc bulging and mild facet arthrosis without stenosis.  L3-4: Disc desiccation and mild-to-moderate disc space narrowing asymmetric to the right. Disc bulging, shallow right subarticular disc protrusion, and mild facet arthrosis result in minimal right lateral recess narrowing without spinal or neural foraminal stenosis or evidence of L4 nerve root impingement.  L4-5: Mild disc desiccation. Listhesis with mild bulging of uncovered disc, mild ligamentum flavum thickening, and moderate facet arthrosis result in minimal right greater than left lateral recess narrowing and borderline spinal stenosis without significant neural foraminal stenosis. There is a right extraforaminal disc protrusion with annular fissure which contacts the right L4 nerve without evidence of  neural compression. Trace right facet joint effusion.  L5-S1:  Mild facet arthrosis without disc herniation or stenosis.  IMPRESSION: 1. Moderate L4-5 facet arthrosis with grade 1 anterolisthesis. Right extraforaminal disc protrusion with annular fissure may  irritate the right L4 nerve. 2. L2-3 and L3-4 disc degeneration without evidence of neural impingement.   Electronically Signed   By: Logan Bores M.D.   On: 01/20/2017 14:26  Patient had spinal cord stimulator implanted in 2019 but states that this was removed a short period later since it did not work.  She states that she has never seen a spine surgeon that discussed any other surgical options for her.  She has had multiple injections in the past.  Currently she describes worsening right greater than left low back pain with radiation into the right buttock and right thigh just above the knee.  Left side radiates to the left buttock.  She describes all areas of pain as being constant but aggravated with standing, bending, walking.  States that walking distances have greatly decreased over the last 6 months.  While in the grocery store she does have to lean forward over a cart in hopes of trying to relieve her symptoms.  No complaints of lower extremity numbness tingling.  Denies bowel or bladder incontinence.  She is currently taking oxycodone for pain.     Review of Systems No current cardiac pulmonary GI GU issues  Objective: Vital Signs: BP (!) 146/100   Pulse 83   Ht 5\' 9"  (1.753 m)   Wt 180 lb (81.6 kg)   BMI 26.58 kg/m   Physical Exam HENT:     Head: Normocephalic and atraumatic.  Eyes:     Extraocular Movements: Extraocular movements intact.     Pupils: Pupils are equal, round, and reactive to light.  Pulmonary:     Effort: No respiratory distress.  Musculoskeletal:     Comments: Gait is somewhat antalgic.  Some low back pain with lumbar extension.  Positive bilateral lumbar paraspinal tenderness.  Positive bilateral SI joint and sciatic notch tenderness.  Negative logroll bilateral hips.  Positive bilateral straight leg raise.  No focal motor deficits.  Neurological:     Mental Status: She is alert and oriented to person, place, and time.  Psychiatric:        Mood  and Affect: Mood normal.     Ortho Exam  Specialty Comments:  No specialty comments available.  Imaging: No results found.   PMFS History: Patient Active Problem List   Diagnosis Date Noted  . Dyslipidemia (high LDL; low HDL) 03/03/2019  . Vertebral artery dissection ([place]) 09/01/2018  . Current smoker 09/01/2018  . Plantar callus 08/13/2018  . Chronic venous stasis 05/14/2018  . Thumb pain, left 03/22/2018  . Extensor carpi radialis brevis tenosynovitis 03/22/2018  . Non-intractable vomiting with nausea 03/22/2018  . Hiatal hernia 03/22/2018  . Bilateral cataracts 11/04/2017  . Disorder of refraction 11/04/2017  . Mitral regurgitation 10/22/2017  . Tricuspid regurgitation 10/22/2017  . Left atrial enlargement 10/22/2017  . Lumbar spondylolysis 09/23/2017  . Drug-seeking behavior 09/19/2017  . Essential hypertension 09/19/2017  . Opioid dependence with withdrawal ([place]) 09/19/2017  . Chronic bilateral low back pain with bilateral sciatica 09/19/2017  . Cigarette nicotine dependence without complication [id number]  . Iron deficiency anemia 05/13/2017  . Internal hemorrhoid 12/05/2015  . Insomnia secondary to chronic pain 06/01/2015  . Upper back pain on right side 06/01/2015  . S/P total knee replacement 09/13/2014  . Left ankle  pain 09/13/2014  . Plantar fasciitis, bilateral 03/21/2014  . Bilateral lower extremity edema 10/26/2013  . Left knee pain 10/26/2013  . Tricompartment degenerative joint disease of knee 10/26/2013  . Elevated alkaline phosphatase level 10/15/2013  . Aneurysm artery, neck ([place]) 09/15/2013  . Pelvic fracture ([place]) 09/15/2013  . Depression 09/15/2013  . Lymphedema of leg 09/13/2013  . Pneumothorax on left 09/13/2013  . Left rib fracture 09/13/2013  . Fibromyalgia 09/13/2013  . Lumbar disc disease 09/13/2013   Past Medical History:  Diagnosis Date  . Back disorder   . Cellulitis of left lower leg 08/04/2017  . Fibromyalgia   . Knee gives  out   . Other disorders of continuity of bone, right pelvic region and thigh   . Stroke [facility])     Family History  Problem Relation Age of Onset  . Heart attack Father   . Cancer Father     Past Surgical History:  Procedure Laterality Date  . CHOLECYSTECTOMY    . LEG SURGERY Bilateral    numerous leg surgeries post accident age 24  . SIGMOID RESECTION / RECTOPEXY     Social History   Occupational History  . Occupation: nurse    Comment: retired  Tobacco Use  . Smoking status: Current Every Day Smoker    Packs/day: 0.50    Years: 40.00    Pack years: 20.00    Types: Cigarettes    Last attempt to quit: 08/23/2013    Years since quitting: 5.8  . Smokeless tobacco: Never Used  Substance and Sexual Activity  . Alcohol use: No  . Drug use: No  . Sexual activity: Not Currently

## 2019-07-11 ENCOUNTER — Other Ambulatory Visit: Payer: Self-pay

## 2019-07-11 ENCOUNTER — Ambulatory Visit (INDEPENDENT_AMBULATORY_CARE_PROVIDER_SITE_OTHER): Payer: Medicare HMO

## 2019-07-11 DIAGNOSIS — M5136 Other intervertebral disc degeneration, lumbar region: Secondary | ICD-10-CM | POA: Diagnosis not present

## 2019-07-11 DIAGNOSIS — M4807 Spinal stenosis, lumbosacral region: Secondary | ICD-10-CM | POA: Diagnosis not present

## 2019-07-11 DIAGNOSIS — M545 Low back pain: Secondary | ICD-10-CM | POA: Diagnosis not present

## 2019-07-11 DIAGNOSIS — M4316 Spondylolisthesis, lumbar region: Secondary | ICD-10-CM

## 2019-07-23 DIAGNOSIS — Z79891 Long term (current) use of opiate analgesic: Secondary | ICD-10-CM | POA: Diagnosis not present

## 2019-07-23 DIAGNOSIS — G8929 Other chronic pain: Secondary | ICD-10-CM | POA: Diagnosis not present

## 2019-07-23 DIAGNOSIS — Z79899 Other long term (current) drug therapy: Secondary | ICD-10-CM | POA: Diagnosis not present

## 2019-07-23 DIAGNOSIS — R69 Illness, unspecified: Secondary | ICD-10-CM | POA: Diagnosis not present

## 2019-07-23 DIAGNOSIS — M5416 Radiculopathy, lumbar region: Secondary | ICD-10-CM | POA: Diagnosis not present

## 2019-07-27 ENCOUNTER — Other Ambulatory Visit: Payer: Self-pay

## 2019-07-27 ENCOUNTER — Encounter: Payer: Self-pay | Admitting: Orthopaedic Surgery

## 2019-07-27 ENCOUNTER — Ambulatory Visit: Payer: Medicare HMO | Admitting: Orthopaedic Surgery

## 2019-07-27 VITALS — Ht 69.0 in | Wt 180.0 lb

## 2019-07-27 DIAGNOSIS — M5442 Lumbago with sciatica, left side: Secondary | ICD-10-CM

## 2019-07-27 DIAGNOSIS — M5441 Lumbago with sciatica, right side: Secondary | ICD-10-CM | POA: Diagnosis not present

## 2019-07-27 DIAGNOSIS — G8929 Other chronic pain: Secondary | ICD-10-CM | POA: Diagnosis not present

## 2019-07-27 DIAGNOSIS — R69 Illness, unspecified: Secondary | ICD-10-CM | POA: Diagnosis not present

## 2019-07-27 DIAGNOSIS — F3342 Major depressive disorder, recurrent, in full remission: Secondary | ICD-10-CM

## 2019-07-27 NOTE — Progress Notes (Signed)
Office Visit Note   Patient: Sophia Smith           Date of Birth: July 25, 1961           MRN: [id number] Visit Date: 07/27/2019              Requested by: Donella Stade, PA-C [address] [place],  [place] [address] PCP: Donella Stade, PA-C   Assessment & Plan: Visit Diagnoses:  1. Chronic bilateral low back pain with bilateral sciatica   2. Recurrent major depressive disorder, in full remission ([place])     Plan: We reviewed MRI images comparison to 2018 MRI.  Patient has transitional anatomy.  Some progression of grade 1 anterolisthesis L4-5 since 2018 due to chronic facet arthropathy without pars defect.  Degenerative endplate changes at [id number] with edema.  Mild chronic T12 superior endplate compression which is new since 2018. Patient does not have severe stenosis on MRI scan.  Her long-term opioid use with 40 mg of oxycodone a day would make postoperative pain a problem if operative intervention is considered.  She does have lateral recess stenosis and would need stabilization at the L4-5 level where she has some progressive anterolisthesis.  Some portion of her pain may be coming from endplate degenerative changes at L2-3.  Does not appear that the endplate fracture 624THL is symptomatic.  Previous history of falls with rib fractures, pneumothorax T12 compression fracture.  We discussed options I will check her back again in 2 months.  If she decides that she would like to consider surgery should need to decrease her narcotic dosage by cutting it in half so it has some room to increase it postoperatively.  We discussed the poor postoperative results in patients taking higher dosages of chronic narcotic medication.  We will send in some time discussing this and reviewing her scans recheck 2 months.  Follow-Up Instructions: Return in about 2 months (around 09/27/2019).   Orders:  No orders of the defined types were placed in this encounter.  No orders of the defined  types were placed in this encounter.     Procedures: No procedures performed   Clinical Data: No additional findings.   Subjective: Chief Complaint  Patient presents with  . Lower Back - Pain, Follow-up    MRI Lumbar Review    HPI 58 year old female returns with ongoing problems with chronic back pain new MRI has been obtained.  She has been using oxycodone for pain and is on Percocet 10/325 4 tablets daily, 120 tablets monthly.  She has been on some narcotic for pain medication for multiple years.  She has more symptoms in the right leg than left leg.  She is on her third pain clinic currently.  New MRI scan is been obtained and is available for comparison to MRI scan 2018, lumbar.  Patient has spinal cord stimulator placed in 2019 later had it removed since it did not work.  She has neurogenic claudication symptoms has to lean over a grocery cart cannot stand long has problems walking more than 2-3 blocks.  No bowel or bladder incontinence no fever chills.  Review of Systems 14 point update unchanged from 07/06/2019 other than as mentioned in HPI.   Objective: Vital Signs: Ht 5\' 9"  (1.753 m)   Wt 180 lb (81.6 kg)   BMI 26.58 kg/m   Physical Exam Constitutional:      Appearance: She is well-developed.  HENT:     Head: Normocephalic.  Right Ear: External ear normal.     Left Ear: External ear normal.  Eyes:     Pupils: Pupils are equal, round, and reactive to light.  Neck:     Thyroid: No thyromegaly.     Trachea: No tracheal deviation.  Cardiovascular:     Rate and Rhythm: Normal rate.  Pulmonary:     Effort: Pulmonary effort is normal.  Abdominal:     Palpations: Abdomen is soft.  Skin:    General: Skin is warm and dry.  Neurological:     Mental Status: She is alert and oriented to person, place, and time.  Psychiatric:        Behavior: Behavior normal.     Ortho Exam well-healed midline incision L5-S1 from a spinal cord stimulator surgery.  Mild sciatic  notch tenderness some pain with straight leg raising.  Knee and ankle jerk are symmetrical.  Negative logroll to the hips knees reach full extension no isolated motor weakness anterior tib quads gastrocsoleus hip flexors hip abductors or adductors.  Distal pulses 2+.  Specialty Comments:  No specialty comments available.  Imaging: CLINICAL DATA:  58 year old female with chronic low back pain radiating to the right side, right hip and leg.  EXAM: MRI LUMBAR SPINE WITHOUT CONTRAST  TECHNIQUE: Multiplanar, multisequence MR imaging of the lumbar spine was performed. No intravenous contrast was administered.  COMPARISON:  Lumbar radiographs 07/06/2019. Lumbar MRI 01/20/2017.  FINDINGS: Segmentation: There appear to be 6 non-rib-bearing lumbar type vertebral bodies on the recent radiographs. The same numbering system will be used as on the 2018 MRI, designating absent ribs at T12, lowest open disc space L5-S1. Correlation with radiographs is recommended prior to any operative intervention.  Alignment: Grade 1 anterolisthesis of L4 on L5 appears more mildly increased since 2018 from about 3 millimeters to about 5 millimeters now. Stable superimposed mild retrolisthesis of L1 on L2 and L2 on L3. Overall stable lumbar lordosis.  Vertebrae: Mild chronic T12 superior endplate compression fracture is also new since 2018. No retropulsion of bone or complicating features. Degenerative endplate marrow edema posteriorly at L2-L3 is new. No other No marrow edema or evidence of acute osseous abnormality. Normal background bone marrow signal. Intact visible sacrum and SI joints.  Conus medullaris and cauda equina: Conus extends to the L1 level. No lower spinal cord or conus signal abnormality.  Paraspinal and other soft tissues: Stable and negative.  Disc levels:  No lower thoracic spinal stenosis.  T12-L1:  Negative.  L1-L2: Mild chronic retrolisthesis with disc space loss and  mild disc bulge.  L2-L3: Chronic retrolisthesis with disc space loss. Circumferential disc osteophyte complex. Left greater than right foraminal involvement. No significant spinal or lateral recess stenosis. Mild to moderate left and borderline to mild right L2 foraminal stenosis appears stable.  L3-L4: Circumferential disc bulge with mild endplate spurring. Mild right greater than left L3 foraminal stenosis is stable.  L4-L5: Anterolisthesis with circumferential disc/pseudo disc and moderate to severe facet and ligament flavum hypertrophy. Resolved facet joint fluid since 2018 and there may now be developing bilateral facet ankylosis at this level (series 2, image 4 on the right).  Moderate right lateral recess stenosis has increased (right L5 nerve level. Mild spinal stenosis is stable. Mild right greater than left L4 foraminal stenosis appears stable.  L5-S1: Negative disc. Mild to moderate facet hypertrophy. But no stenosis.  IMPRESSION: 1. Transitional lumbosacral anatomy. Same numbering system used as on a 2018 MRI designating the lowest open disc space  L5-S1, and this results in absent ribs at T12. Correlation with radiographs is recommended prior to any operative intervention. 2. Mild progression of grade 1 anterolisthesis at L4-L5 since 2018. Associated severe chronic facet arthropathy, although possible developing facet ankylosis now at this level. Moderate right lateral recess stenosis appears increased, query right L5 radiculitis. Stable mild spinal and foraminal stenosis. 3. Degenerative endplate marrow edema at L2-L3. No other acute osseous abnormality although a mild chronic T12 superior endplate compression fracture is new since 2018. No complicating features. 4. Other lumbar levels are stable since 2018.   Electronically Signed   By: Genevie Ann M.D.   On: 07/11/2019 21:14   PMFS History: Patient Active Problem List   Diagnosis Date Noted  .  Dyslipidemia (high LDL; low HDL) 03/03/2019  . Vertebral artery dissection ([place]) 09/01/2018  . Current smoker 09/01/2018  . Plantar callus 08/13/2018  . Chronic venous stasis 05/14/2018  . Thumb pain, left 03/22/2018  . Extensor carpi radialis brevis tenosynovitis 03/22/2018  . Non-intractable vomiting with nausea 03/22/2018  . Hiatal hernia 03/22/2018  . Bilateral cataracts 11/04/2017  . Disorder of refraction 11/04/2017  . Mitral regurgitation 10/22/2017  . Tricuspid regurgitation 10/22/2017  . Left atrial enlargement 10/22/2017  . Lumbar spondylolysis 09/23/2017  . Drug-seeking behavior 09/19/2017  . Essential hypertension 09/19/2017  . Opioid dependence with withdrawal ([place]) 09/19/2017  . Chronic bilateral low back pain with bilateral sciatica 09/19/2017  . Cigarette nicotine dependence without complication [id number]  . Iron deficiency anemia 05/13/2017  . Internal hemorrhoid 12/05/2015  . Insomnia secondary to chronic pain 06/01/2015  . Upper back pain on right side 06/01/2015  . S/P total knee replacement 09/13/2014  . Left ankle pain 09/13/2014  . Plantar fasciitis, bilateral 03/21/2014  . Bilateral lower extremity edema 10/26/2013  . Left knee pain 10/26/2013  . Tricompartment degenerative joint disease of knee 10/26/2013  . Elevated alkaline phosphatase level 10/15/2013  . Aneurysm artery, neck ([place]) 09/15/2013  . Pelvic fracture ([place]) 09/15/2013  . Depression 09/15/2013  . Lymphedema of leg 09/13/2013  . Pneumothorax on left 09/13/2013  . Left rib fracture 09/13/2013  . Fibromyalgia 09/13/2013  . Lumbar disc disease 09/13/2013   Past Medical History:  Diagnosis Date  . Back disorder   . Cellulitis of left lower leg 08/04/2017  . Fibromyalgia   . Knee gives out   . Other disorders of continuity of bone, right pelvic region and thigh   . Stroke [facility])     Family History  Problem Relation Age of Onset  . Heart attack Father   . Cancer Father     Past  Surgical History:  Procedure Laterality Date  . CHOLECYSTECTOMY    . LEG SURGERY Bilateral    numerous leg surgeries post accident age 51  . SIGMOID RESECTION / RECTOPEXY     Social History   Occupational History  . Occupation: nurse    Comment: retired  Tobacco Use  . Smoking status: Current Every Day Smoker    Packs/day: 0.50    Years: 40.00    Pack years: 20.00    Types: Cigarettes    Last attempt to quit: 08/23/2013    Years since quitting: 5.9  . Smokeless tobacco: Never Used  Substance and Sexual Activity  . Alcohol use: No  . Drug use: No  . Sexual activity: Not Currently

## 2019-07-28 ENCOUNTER — Ambulatory Visit: Payer: Medicare HMO

## 2019-08-11 ENCOUNTER — Ambulatory Visit (INDEPENDENT_AMBULATORY_CARE_PROVIDER_SITE_OTHER): Payer: Medicare HMO

## 2019-08-11 ENCOUNTER — Other Ambulatory Visit: Payer: Self-pay

## 2019-08-11 DIAGNOSIS — Z1231 Encounter for screening mammogram for malignant neoplasm of breast: Secondary | ICD-10-CM | POA: Diagnosis not present

## 2019-08-11 NOTE — Progress Notes (Signed)
Normal mammogram

## 2019-08-21 DIAGNOSIS — Z79899 Other long term (current) drug therapy: Secondary | ICD-10-CM | POA: Diagnosis not present

## 2019-08-21 DIAGNOSIS — M5416 Radiculopathy, lumbar region: Secondary | ICD-10-CM | POA: Diagnosis not present

## 2019-08-21 DIAGNOSIS — Z79891 Long term (current) use of opiate analgesic: Secondary | ICD-10-CM | POA: Diagnosis not present

## 2019-08-21 DIAGNOSIS — F17210 Nicotine dependence, cigarettes, uncomplicated: Secondary | ICD-10-CM | POA: Diagnosis not present

## 2019-08-21 DIAGNOSIS — G8929 Other chronic pain: Secondary | ICD-10-CM | POA: Diagnosis not present

## 2019-08-21 DIAGNOSIS — G4700 Insomnia, unspecified: Secondary | ICD-10-CM | POA: Diagnosis not present

## 2019-09-03 ENCOUNTER — Ambulatory Visit: Payer: Medicare HMO | Admitting: Physician Assistant

## 2019-09-06 ENCOUNTER — Ambulatory Visit (INDEPENDENT_AMBULATORY_CARE_PROVIDER_SITE_OTHER): Payer: Medicare HMO | Admitting: Physician Assistant

## 2019-09-06 ENCOUNTER — Other Ambulatory Visit: Payer: Self-pay

## 2019-09-06 VITALS — BP 139/95 | HR 110 | Ht 69.0 in | Wt 181.0 lb

## 2019-09-06 DIAGNOSIS — G8929 Other chronic pain: Secondary | ICD-10-CM | POA: Diagnosis not present

## 2019-09-06 DIAGNOSIS — M5441 Lumbago with sciatica, right side: Secondary | ICD-10-CM | POA: Diagnosis not present

## 2019-09-06 DIAGNOSIS — I10 Essential (primary) hypertension: Secondary | ICD-10-CM

## 2019-09-06 DIAGNOSIS — E782 Mixed hyperlipidemia: Secondary | ICD-10-CM | POA: Diagnosis not present

## 2019-09-06 DIAGNOSIS — M5442 Lumbago with sciatica, left side: Secondary | ICD-10-CM

## 2019-09-06 DIAGNOSIS — Z1159 Encounter for screening for other viral diseases: Secondary | ICD-10-CM | POA: Diagnosis not present

## 2019-09-06 DIAGNOSIS — G4701 Insomnia due to medical condition: Secondary | ICD-10-CM | POA: Diagnosis not present

## 2019-09-06 MED ORDER — AMITRIPTYLINE HCL 150 MG PO TABS: 150.0000 mg | ORAL_TABLET | Freq: Every day | ORAL | 1 refills | Status: DC

## 2019-09-06 NOTE — Progress Notes (Signed)
Acute Office Visit  Subjective:    Patient ID: Sophia Smith, female    DOB: Apr 27, 1961, 59 y.o.   MRN: [id number]  Chief Complaint  Patient presents with  . Hypertension    HPI Patient is in today for 6 month follow up.   Pt continues to have problems with sleep due to pain. No real difference with elavil. She goes to pain clinic for oxycodone. Elavil did help her get to sleep but she still woke up due to pain a few hours earlier. No CP, palpitations, headaches or vision changes. No swelling or joint pain. Melatonin and unisom did not work.    Past Medical History:  Diagnosis Date  . Back disorder   . Cellulitis of left lower leg 08/04/2017  . Fibromyalgia   . Knee gives out   . Other disorders of continuity of bone, right pelvic region and thigh   . Stroke [facility])     Past Surgical History:  Procedure Laterality Date  . CHOLECYSTECTOMY    . LEG SURGERY Bilateral    numerous leg surgeries post accident age 65  . SIGMOID RESECTION / RECTOPEXY      Family History  Problem Relation Age of Onset  . Heart attack Father   . Cancer Father     Social History   Socioeconomic History  . Marital status: Divorced    Spouse name: Not on file  . Number of children: 1  . Years of education: 59  . Highest education level: Associate degree: academic program  Occupational History  . Occupation: nurse    Comment: retired  Tobacco Use  . Smoking status: Current Every Day Smoker    Packs/day: 0.50    Years: 40.00    Pack years: 20.00    Types: Cigarettes    Last attempt to quit: 08/23/2013    Years since quitting: 6.0  . Smokeless tobacco: Never Used  Substance and Sexual Activity  . Alcohol use: No  . Drug use: No  . Sexual activity: Not Currently  Other Topics Concern  . Not on file  Social History Narrative  . Not on file   Social Determinants of Health   Financial Resource Strain: Low Risk   . Difficulty of Paying Living Expenses: Not hard at all  Food  Insecurity: No Food Insecurity  . Worried About Charity fundraiser in the Last Year: Never true  . Ran Out of Food in the Last Year: Never true  Transportation Needs: No Transportation Needs  . Lack of Transportation (Medical): No  . Lack of Transportation (Non-Medical): No  Physical Activity: Inactive  . Days of Exercise per Week: 0 days  . Minutes of Exercise per Session: 0 min  Stress: No Stress Concern Present  . Feeling of Stress : Not at all  Social Connections: Moderately Isolated  . Frequency of Communication with Friends and Family: More than three times a week  . Frequency of Social Gatherings with Friends and Family: Twice a week  . Attends Religious Services: Never  . Active Member of Clubs or Organizations: No  . Attends Archivist Meetings: Never  . Marital Status: Divorced  Human resources officer Violence: Not At Risk  . Fear of Current or Ex-Partner: No  . Emotionally Abused: No  . Physically Abused: No  . Sexually Abused: No    Outpatient Medications Prior to Visit  Medication Sig Dispense Refill  . amLODipine (NORVASC) 5 MG tablet Take 1 tablet (5 mg total)  by mouth daily. 90 tablet 1  . atorvastatin (LIPITOR) 40 MG tablet Take 1 tablet (40 mg total) by mouth daily. 90 tablet 3  . cyclobenzaprine (FLEXERIL) 10 MG tablet Take 1 tablet (10 mg total) by mouth at bedtime. 30 tablet 5  . diclofenac sodium (VOLTAREN) 1 % GEL Apply 4 g topically 4 (four) times daily. 1 Tube 3  . DULoxetine (CYMBALTA) 60 MG capsule Take 1 capsule (60 mg total) by mouth 2 (two) times daily. 60 capsule 5  . furosemide (LASIX) 40 MG tablet Take 1 tablet (40 mg total) by mouth daily. As needed for lower extremity edema. 30 tablet 0  . gabapentin (NEURONTIN) 600 MG tablet Take 900 mg by mouth 3 (three) times daily.    . hydrochlorothiazide (HYDRODIURIL) 12.5 MG tablet Take 1 tablet (12.5 mg total) by mouth daily. 90 tablet 1  . oxyCODONE-acetaminophen (PERCOCET) 10-325 MG tablet Take 1  tablet by mouth every 4 (four) hours as needed for pain.    . pantoprazole (PROTONIX) 40 MG tablet TAKE ONE TABLET BY MOUTH DAILY 90 tablet 2  . amitriptyline (ELAVIL) 50 MG tablet TAKE ONE TABLET BY MOUTH EVERY NIGHT AT BEDTIME (Patient taking differently: Take 100 mg by mouth at bedtime. ) 90 tablet 1   No facility-administered medications prior to visit.    Allergies  Allergen Reactions  . Morphine And Related   . Nsaids     Aleve,ibuprofen all seems to caused sharp pains in abdominal and discomfort.     Review of Systems     Objective:    Physical Exam Manual BP: 140/90  HR: 102 bpm Heart rate is tachycardic but in normal rhythm. Lungs CTAB. BP (!) 139/95   Pulse (!) 110   Ht 5\' 9"  (1.753 m)   Wt 181 lb (82.1 kg)   SpO2 99%   BMI 26.73 kg/m  Wt Readings from Last 3 Encounters:  09/06/19 181 lb (82.1 kg)  07/27/19 180 lb (81.6 kg)  07/06/19 180 lb (81.6 kg)    .Marland Kitchen Depression screen [facility] 2/9 09/06/2019 06/09/2019 03/15/2019 01/29/2019 03/10/2018  Decreased Interest 1 3 0 1 3  Down, Depressed, Hopeless 0 1 0 1 2  PHQ - 2 Score 1 4 0 2 5  Altered sleeping 3 3 2 3 3   Tired, decreased energy 1 3 1  0 1  Change in appetite 1 0 0 0 1  Feeling bad or failure about yourself  0 0 0 0 1  Trouble concentrating 0 0 0 0 0  Moving slowly or fidgety/restless 0 0 0 0 0  Suicidal thoughts 0 0 0 0 1  PHQ-9 Score 6 10 3 5 12   Difficult doing work/chores Somewhat difficult Very difficult Not difficult at all Somewhat difficult Extremely dIfficult   .Marland Kitchen GAD 7 : Generalized Anxiety Score 09/06/2019 06/09/2019 01/29/2019 03/10/2018  Nervous, Anxious, on Edge 0 0 0 0  Control/stop worrying 1 0 0 3  Worry too much - different things 0 0 0 1  Trouble relaxing 1 1 1 2   Restless 1 0 0 0  Easily annoyed or irritable 0 0 0 0  Afraid - awful might happen 0 0 0 0  Total GAD 7 Score 3 1 1 6   Anxiety Difficulty Somewhat difficult Very difficult Somewhat difficult Very difficult     Assessment & Plan:   Marland KitchenMarland KitchenHonora was seen today for hypertension.  Diagnoses and all orders for this visit:  Essential hypertension  Mixed hyperlipidemia -  Lipid Panel w/reflex Direct LDL  Encounter for hepatitis C screening test for low risk patient -     Hepatitis C Antibody  Chronic bilateral low back pain with bilateral sciatica -     amitriptyline (ELAVIL) 150 MG tablet; Take 1 tablet (150 mg total) by mouth at bedtime.  Insomnia secondary to chronic pain -     amitriptyline (ELAVIL) 150 MG tablet; Take 1 tablet (150 mg total) by mouth at bedtime.   BP up. Increase norvasc to 10mg  daily. Keep BP log and recheck in 2 weeks.   Increase elavil to 150mg  daily. Perhaps will help with sleep and pain. Discussed exercises and conservative ways to decrease pain. Continue follow up with pain clinic.   Recheck lipid level after starting lipitor.

## 2019-09-07 LAB — LIPID PANEL W/REFLEX DIRECT LDL
Cholesterol: 183 mg/dL (ref <200)
HDL: 49 mg/dL — ABNORMAL LOW (ref >=50)
LDL Cholesterol (Calc): 112 mg/dL (calc) — ABNORMAL HIGH
Non-HDL Cholesterol (Calc): 134 mg/dL (calc) — ABNORMAL HIGH (ref <130)
Total CHOL/HDL Ratio: 3.7 (calc) (ref <5.0)
Triglycerides: 116 mg/dL (ref <150)

## 2019-09-07 LAB — HEPATITIS C ANTIBODY
Hepatitis C Ab: NONREACTIVE
SIGNAL TO CUT-OFF: 0.01 (ref <1.00)

## 2019-09-07 NOTE — Progress Notes (Signed)
Call pt:   LDL decreased but not to the level predicted. Are you taking lipitor 40mg  every day?

## 2019-09-14 ENCOUNTER — Other Ambulatory Visit: Payer: Self-pay | Admitting: Family

## 2019-09-14 ENCOUNTER — Other Ambulatory Visit: Payer: Self-pay | Admitting: Physician Assistant

## 2019-09-14 DIAGNOSIS — K449 Diaphragmatic hernia without obstruction or gangrene: Secondary | ICD-10-CM

## 2019-09-14 DIAGNOSIS — I10 Essential (primary) hypertension: Secondary | ICD-10-CM

## 2019-09-14 DIAGNOSIS — R112 Nausea with vomiting, unspecified: Secondary | ICD-10-CM

## 2019-09-18 DIAGNOSIS — G8929 Other chronic pain: Secondary | ICD-10-CM | POA: Diagnosis not present

## 2019-09-18 DIAGNOSIS — M5416 Radiculopathy, lumbar region: Secondary | ICD-10-CM | POA: Diagnosis not present

## 2019-09-18 DIAGNOSIS — Z6831 Body mass index (BMI) 31.0-31.9, adult: Secondary | ICD-10-CM | POA: Diagnosis not present

## 2019-09-18 DIAGNOSIS — Z79899 Other long term (current) drug therapy: Secondary | ICD-10-CM | POA: Diagnosis not present

## 2019-09-28 ENCOUNTER — Ambulatory Visit: Payer: Medicare HMO | Admitting: Orthopaedic Surgery

## 2019-10-05 ENCOUNTER — Telehealth: Payer: Self-pay | Admitting: Neurology

## 2019-10-05 NOTE — Telephone Encounter (Signed)
Patient left a vm stating she received [employer]mons for 11/04/2019 and would like an exemption letter written due to her back/hip pain. Okay to write?

## 2019-10-06 NOTE — Telephone Encounter (Signed)
Yes she has chronic pain that makes it hard for her to sit for long periods of time.

## 2019-10-06 NOTE — Telephone Encounter (Signed)
Left message on machine for patient letting her know letter written and at the front desk for pick up.

## 2019-10-12 ENCOUNTER — Ambulatory Visit: Payer: Medicare HMO | Admitting: Orthopaedic Surgery

## 2019-10-16 DIAGNOSIS — M5416 Radiculopathy, lumbar region: Secondary | ICD-10-CM | POA: Diagnosis not present

## 2019-10-16 DIAGNOSIS — G8929 Other chronic pain: Secondary | ICD-10-CM | POA: Diagnosis not present

## 2019-10-16 DIAGNOSIS — Z79899 Other long term (current) drug therapy: Secondary | ICD-10-CM | POA: Diagnosis not present

## 2019-10-16 DIAGNOSIS — F1120 Opioid dependence, uncomplicated: Secondary | ICD-10-CM | POA: Diagnosis not present

## 2019-10-16 DIAGNOSIS — Z79891 Long term (current) use of opiate analgesic: Secondary | ICD-10-CM | POA: Diagnosis not present

## 2019-10-22 ENCOUNTER — Other Ambulatory Visit: Payer: Self-pay | Admitting: Physician Assistant

## 2019-10-22 DIAGNOSIS — M797 Fibromyalgia: Secondary | ICD-10-CM

## 2019-10-22 MED ORDER — CYCLOBENZAPRINE HCL 10 MG PO TABS: 10.0000 mg | ORAL_TABLET | Freq: Every day | ORAL | 5 refills | Status: DC

## 2019-10-22 NOTE — Telephone Encounter (Signed)
Patient calls and would like a refill on her Flexeril 10mg .  She is out of it and pharmacy said it has been so long she will need a new rx sent in.  [place] LPN

## 2019-11-10 DIAGNOSIS — Z79899 Other long term (current) drug therapy: Secondary | ICD-10-CM | POA: Diagnosis not present

## 2019-11-10 DIAGNOSIS — Z79891 Long term (current) use of opiate analgesic: Secondary | ICD-10-CM | POA: Diagnosis not present

## 2019-11-10 DIAGNOSIS — G8929 Other chronic pain: Secondary | ICD-10-CM | POA: Diagnosis not present

## 2019-11-10 DIAGNOSIS — M5416 Radiculopathy, lumbar region: Secondary | ICD-10-CM | POA: Diagnosis not present

## 2019-11-15 ENCOUNTER — Telehealth: Payer: Self-pay | Admitting: Neurology

## 2019-11-15 DIAGNOSIS — I10 Essential (primary) hypertension: Secondary | ICD-10-CM

## 2019-11-15 DIAGNOSIS — F3342 Major depressive disorder, recurrent, in full remission: Secondary | ICD-10-CM

## 2019-11-15 DIAGNOSIS — M797 Fibromyalgia: Secondary | ICD-10-CM

## 2019-11-15 MED ORDER — DULOXETINE HCL 60 MG PO CPEP: 60.0000 mg | ORAL_CAPSULE | Freq: Two times a day (BID) | ORAL | 0 refills | Status: DC

## 2019-11-15 MED ORDER — AMLODIPINE BESYLATE 10 MG PO TABS: 10.0000 mg | ORAL_TABLET | Freq: Every day | ORAL | 0 refills | Status: DC

## 2019-11-15 MED ORDER — HYDROCHLOROTHIAZIDE 12.5 MG PO TABS: 12.5000 mg | ORAL_TABLET | Freq: Every day | ORAL | 0 refills | Status: DC

## 2019-11-15 NOTE — Telephone Encounter (Signed)
I sent correct medications and refills.

## 2019-11-15 NOTE — Telephone Encounter (Signed)
Patient left vm stating she needs refills on Amlodipine and HCTZ. She states last visit Amlodipine was increased to 2 tablets daily. I don't see anything in note on 09/06/2019. Also looks like due for labs. Please advise on Amlodipine if okay to send for 10 mg daily.

## 2019-12-06 ENCOUNTER — Ambulatory Visit: Payer: Medicare HMO | Admitting: Physician Assistant

## 2019-12-08 DIAGNOSIS — M5416 Radiculopathy, lumbar region: Secondary | ICD-10-CM | POA: Diagnosis not present

## 2019-12-08 DIAGNOSIS — Z79899 Other long term (current) drug therapy: Secondary | ICD-10-CM | POA: Diagnosis not present

## 2019-12-08 DIAGNOSIS — Z79891 Long term (current) use of opiate analgesic: Secondary | ICD-10-CM | POA: Diagnosis not present

## 2019-12-08 DIAGNOSIS — G8929 Other chronic pain: Secondary | ICD-10-CM | POA: Diagnosis not present

## 2019-12-13 ENCOUNTER — Ambulatory Visit: Payer: Medicare HMO | Admitting: Physician Assistant

## 2019-12-27 ENCOUNTER — Ambulatory Visit: Payer: Medicare HMO | Admitting: Physician Assistant

## 2020-01-05 DIAGNOSIS — Z79891 Long term (current) use of opiate analgesic: Secondary | ICD-10-CM | POA: Diagnosis not present

## 2020-01-05 DIAGNOSIS — G8929 Other chronic pain: Secondary | ICD-10-CM | POA: Diagnosis not present

## 2020-01-05 DIAGNOSIS — Z79899 Other long term (current) drug therapy: Secondary | ICD-10-CM | POA: Diagnosis not present

## 2020-01-05 DIAGNOSIS — M5416 Radiculopathy, lumbar region: Secondary | ICD-10-CM | POA: Diagnosis not present

## 2020-01-13 ENCOUNTER — Ambulatory Visit (INDEPENDENT_AMBULATORY_CARE_PROVIDER_SITE_OTHER): Payer: Medicare HMO

## 2020-01-13 ENCOUNTER — Encounter: Payer: Self-pay | Admitting: Sports Medicine

## 2020-01-13 ENCOUNTER — Other Ambulatory Visit: Payer: Self-pay

## 2020-01-13 ENCOUNTER — Ambulatory Visit (INDEPENDENT_AMBULATORY_CARE_PROVIDER_SITE_OTHER): Payer: Medicare HMO | Admitting: Sports Medicine

## 2020-01-13 VITALS — BP 102/66 | HR 90 | Ht 69.0 in | Wt 193.0 lb

## 2020-01-13 DIAGNOSIS — I340 Nonrheumatic mitral (valve) insufficiency: Secondary | ICD-10-CM

## 2020-01-13 DIAGNOSIS — E876 Hypokalemia: Secondary | ICD-10-CM | POA: Diagnosis not present

## 2020-01-13 DIAGNOSIS — I509 Heart failure, unspecified: Secondary | ICD-10-CM | POA: Diagnosis not present

## 2020-01-13 DIAGNOSIS — D649 Anemia, unspecified: Secondary | ICD-10-CM

## 2020-01-13 DIAGNOSIS — F17200 Nicotine dependence, unspecified, uncomplicated: Secondary | ICD-10-CM

## 2020-01-13 DIAGNOSIS — M7989 Other specified soft tissue disorders: Secondary | ICD-10-CM | POA: Diagnosis not present

## 2020-01-13 DIAGNOSIS — J9811 Atelectasis: Secondary | ICD-10-CM | POA: Diagnosis not present

## 2020-01-13 NOTE — Progress Notes (Addendum)
    Procedures performed today:    None.  Independent interpretation of notes and tests performed by another provider:   None.  Brief History, Exam, Impression, and Recommendations:    Mitral regurgitation This is a pleasant 59 year old female with new onset swelling in both lower extremities, she has bilateral 2+ pitting edema. No orthopnea or PND. No new pulmonary symptoms per her report though she is a heavy smoker. I note that she does have history of moderate mitral regurgitation, she does have a 3/6 systolic murmur. I would like a repeat echocardiogram, I am also going to get some labs including CBC, CMP, BNP. Urinalysis. She is 19 pounds up from 3 months ago. She will continue her Lasix, adding a chest x-ray she does have some coarse sounds in her lungs. She will also wear her lower extremity compression hose and elevate her legs. I like to see her back after her echocardiogram. Though she requested I do not see any evidence of cellulitis or any role for antibiotics at this juncture.  Normocytic anemia Adding anemia panel work-up.  Hypokalemia Likely due to Lasix supplementation. Adding KCl 40 M EQ daily, recheck BMP in a week.  Current smoker Greater than 30-pack-year smoking history, ordering lung cancer screening CT    ___________________________________________ Gwen Her. Dianah Field, M.D., ABFM., CAQSM. Primary Care and [place] Instructor of [place] [employer] [facility] [employer]

## 2020-01-13 NOTE — Assessment & Plan Note (Addendum)
This is a pleasant 59 year old female with new onset swelling in both lower extremities, she has bilateral 2+ pitting edema. No orthopnea or PND. No new pulmonary symptoms per her report though she is a heavy smoker. I note that she does have history of moderate mitral regurgitation, she does have a 3/6 systolic murmur. I would like a repeat echocardiogram, I am also going to get some labs including CBC, CMP, BNP. Urinalysis. She is 19 pounds up from 3 months ago. She will continue her Lasix, adding a chest x-ray she does have some coarse sounds in her lungs. She will also wear her lower extremity compression hose and elevate her legs. I like to see her back after her echocardiogram. Though she requested I do not see any evidence of cellulitis or any role for antibiotics at this juncture.

## 2020-01-14 DIAGNOSIS — E876 Hypokalemia: Secondary | ICD-10-CM | POA: Insufficient documentation

## 2020-01-14 DIAGNOSIS — D649 Anemia, unspecified: Secondary | ICD-10-CM | POA: Insufficient documentation

## 2020-01-14 LAB — COMPLETE METABOLIC PANEL WITH GFR
AG Ratio: 1.6 (calc) (ref 1.0–2.5)
ALT: 12 U/L (ref 6–29)
AST: 17 U/L (ref 10–35)
Albumin: 4.1 g/dL (ref 3.6–5.1)
Alkaline phosphatase (APISO): 131 U/L (ref 37–153)
BUN: 13 mg/dL (ref 7–25)
CO2: 31 mmol/L (ref 20–32)
Calcium: 9 mg/dL (ref 8.6–10.4)
Chloride: 97 mmol/L — ABNORMAL LOW (ref 98–110)
Creat: 0.91 mg/dL (ref 0.50–1.05)
GFR, Est African American: 81 mL/min/{1.73_m2} (ref >=60)
GFR, Est Non African American: 70 mL/min/{1.73_m2} (ref >=60)
Globulin: 2.5 g/dL (calc) (ref 1.9–3.7)
Glucose, Bld: 87 mg/dL (ref 65–99)
Potassium: 3 mmol/L — ABNORMAL LOW (ref 3.5–5.3)
Sodium: 139 mmol/L (ref 135–146)
Total Bilirubin: 0.4 mg/dL (ref 0.2–1.2)
Total Protein: 6.6 g/dL (ref 6.1–8.1)

## 2020-01-14 LAB — CBC
HCT: 34.7 % — ABNORMAL LOW (ref 35.0–45.0)
Hemoglobin: 11.3 g/dL — ABNORMAL LOW (ref 11.7–15.5)
MCH: 26.8 pg — ABNORMAL LOW (ref 27.0–33.0)
MCHC: 32.6 g/dL (ref 32.0–36.0)
MCV: 82.2 fL (ref 80.0–100.0)
MPV: 9.6 fL (ref 7.5–12.5)
Platelets: 361 10*3/uL (ref 140–400)
RBC: 4.22 10*6/uL (ref 3.80–5.10)
RDW: 14.5 % (ref 11.0–15.0)
WBC: 7.6 10*3/uL (ref 3.8–10.8)

## 2020-01-14 LAB — BRAIN NATRIURETIC PEPTIDE: Brain Natriuretic Peptide: 13 pg/mL (ref <100)

## 2020-01-14 LAB — TSH: TSH: 1.77 mIU/L (ref 0.40–4.50)

## 2020-01-14 MED ORDER — POTASSIUM CHLORIDE CRYS ER 20 MEQ PO TBCR: 40.0000 meq | EXTENDED_RELEASE_TABLET | Freq: Every day | ORAL | 3 refills | Status: DC

## 2020-01-14 NOTE — Addendum Note (Signed)
Addended by: Silverio Decamp on: 01/14/2020 08:52 AM   Modules accepted: Orders

## 2020-01-14 NOTE — Assessment & Plan Note (Signed)
Greater than 30-pack-year smoking history, ordering lung cancer screening CT

## 2020-01-14 NOTE — Assessment & Plan Note (Signed)
Likely due to Lasix supplementation. Adding KCl 40 M EQ daily, recheck BMP in a week.

## 2020-01-14 NOTE — Addendum Note (Signed)
Addended by: Silverio Decamp on: 01/14/2020 05:17 PM   Modules accepted: Orders

## 2020-01-14 NOTE — Assessment & Plan Note (Signed)
Adding anemia panel work-up.

## 2020-01-18 ENCOUNTER — Ambulatory Visit (INDEPENDENT_AMBULATORY_CARE_PROVIDER_SITE_OTHER): Payer: Medicare HMO

## 2020-01-18 ENCOUNTER — Telehealth: Payer: Self-pay | Admitting: *Deleted

## 2020-01-18 ENCOUNTER — Other Ambulatory Visit: Payer: Self-pay

## 2020-01-18 DIAGNOSIS — Z122 Encounter for screening for malignant neoplasm of respiratory organs: Secondary | ICD-10-CM | POA: Diagnosis not present

## 2020-01-18 DIAGNOSIS — F17200 Nicotine dependence, unspecified, uncomplicated: Secondary | ICD-10-CM

## 2020-01-18 DIAGNOSIS — F17210 Nicotine dependence, cigarettes, uncomplicated: Secondary | ICD-10-CM | POA: Diagnosis not present

## 2020-01-18 NOTE — Telephone Encounter (Signed)
Pt left vm letting you know that she did not get the b12 or folate labs drawn because it was going to cost her $287 out of pocket.

## 2020-01-24 ENCOUNTER — Encounter: Payer: Self-pay | Admitting: Physician Assistant

## 2020-01-24 ENCOUNTER — Ambulatory Visit (INDEPENDENT_AMBULATORY_CARE_PROVIDER_SITE_OTHER): Payer: Medicare HMO | Admitting: Physician Assistant

## 2020-01-24 ENCOUNTER — Ambulatory Visit (INDEPENDENT_AMBULATORY_CARE_PROVIDER_SITE_OTHER): Payer: Medicare HMO

## 2020-01-24 ENCOUNTER — Other Ambulatory Visit: Payer: Self-pay

## 2020-01-24 VITALS — BP 130/82 | HR 100 | Wt 187.0 lb

## 2020-01-24 DIAGNOSIS — W19XXXA Unspecified fall, initial encounter: Secondary | ICD-10-CM

## 2020-01-24 DIAGNOSIS — I340 Nonrheumatic mitral (valve) insufficiency: Secondary | ICD-10-CM

## 2020-01-24 DIAGNOSIS — I700 Atherosclerosis of aorta: Secondary | ICD-10-CM | POA: Insufficient documentation

## 2020-01-24 DIAGNOSIS — R0781 Pleurodynia: Secondary | ICD-10-CM | POA: Diagnosis not present

## 2020-01-24 DIAGNOSIS — F3342 Major depressive disorder, recurrent, in full remission: Secondary | ICD-10-CM

## 2020-01-24 DIAGNOSIS — J431 Panlobular emphysema: Secondary | ICD-10-CM

## 2020-01-24 DIAGNOSIS — I10 Essential (primary) hypertension: Secondary | ICD-10-CM | POA: Diagnosis not present

## 2020-01-24 DIAGNOSIS — Z720 Tobacco use: Secondary | ICD-10-CM

## 2020-01-24 DIAGNOSIS — M797 Fibromyalgia: Secondary | ICD-10-CM

## 2020-01-24 DIAGNOSIS — S299XXA Unspecified injury of thorax, initial encounter: Secondary | ICD-10-CM | POA: Diagnosis not present

## 2020-01-24 DIAGNOSIS — E876 Hypokalemia: Secondary | ICD-10-CM

## 2020-01-24 DIAGNOSIS — R600 Localized edema: Secondary | ICD-10-CM

## 2020-01-24 DIAGNOSIS — R918 Other nonspecific abnormal finding of lung field: Secondary | ICD-10-CM

## 2020-01-24 MED ORDER — HYDROCHLOROTHIAZIDE 12.5 MG PO TABS: 12.5000 mg | ORAL_TABLET | Freq: Every day | ORAL | 1 refills | Status: DC

## 2020-01-24 MED ORDER — BEVESPI AEROSPHERE 9-4.8 MCG/ACT IN AERO: 2.0000 | INHALATION_SPRAY | Freq: Two times a day (BID) | RESPIRATORY_TRACT | 2 refills | Status: DC

## 2020-01-24 MED ORDER — AMLODIPINE BESYLATE 10 MG PO TABS: 10.0000 mg | ORAL_TABLET | Freq: Every day | ORAL | 1 refills | Status: DC

## 2020-01-24 MED ORDER — DULOXETINE HCL 60 MG PO CPEP: 60.0000 mg | ORAL_CAPSULE | Freq: Two times a day (BID) | ORAL | 1 refills | Status: DC

## 2020-01-24 NOTE — Progress Notes (Signed)
Subjective:    Patient ID: Sophia Smith, female    DOB: [birth year], 59 y.o.   MRN: [id number]  HPI  Pt is a 59 yo female who presents to the clinic to follow up on bilateral lower extremity edema and possible CHF exacerbation.   Patient's bilateral lower leg edema has completely resolved.  She is no longer on Lasix.  Her potassium was low when labs were drawn.  She was given potassium to take with Lasix.  She denies any cramps or side effects.  She has not had her potassium rechecked.  She did have a CT of her chest done for lung cancer screening.  It did show aortic atherosclerosis, emphysema, pulmonary nodules.  She does admit to being progressively short of breath with exertion.  She does have a productive cough that is ongoing.  Patient is already on a statin.  Patient continues to smoke with no interest in quitting. She has not had echo but scheduled for July.   She does complain of some left posterior rib pain.  She did fall a few weeks ago.  Her left posterior ribs hurt with coughing and sneezing.  She wonders if there is anything we can do.  She does see pain clinic and gets OxyContin for her chronic pain.  .. Active Ambulatory Problems    Diagnosis Date Noted   Lymphedema of leg 09/13/2013   Pneumothorax on left 09/13/2013   Left rib fracture 09/13/2013   Fibromyalgia 09/13/2013   Lumbar disc disease 09/13/2013   Aneurysm artery, neck ([place]) 09/15/2013   Pelvic fracture (HCC) 09/15/2013   Depression 09/15/2013   Elevated alkaline phosphatase level 10/15/2013   Bilateral lower extremity edema 10/26/2013   Left knee pain 10/26/2013   Tricompartment degenerative joint disease of knee 10/26/2013   Plantar fasciitis, bilateral 03/21/2014   S/P total knee replacement 09/13/2014   Left ankle pain 09/13/2014   Insomnia secondary to chronic pain 06/01/2015   Upper back pain on right side 06/01/2015   Internal hemorrhoid 12/05/2015   Iron deficiency anemia  05/13/2017   Drug-seeking behavior 09/19/2017   Essential hypertension 09/19/2017   Opioid dependence with withdrawal ([place]) 09/19/2017   Chronic bilateral low back pain with bilateral sciatica 09/19/2017   Cigarette nicotine dependence without complication 02/40/9735   Lumbar spondylolysis 09/23/2017   Mitral regurgitation 10/22/2017   Tricuspid regurgitation 10/22/2017   Left atrial enlargement 10/22/2017   Bilateral cataracts 11/04/2017   Disorder of refraction 11/04/2017   Thumb pain, left 03/22/2018   Extensor carpi radialis brevis tenosynovitis 03/22/2018   Non-intractable vomiting with nausea 03/22/2018   Hiatal hernia 03/22/2018   Chronic venous stasis 05/14/2018   Plantar callus 08/13/2018   Vertebral artery dissection ([place]) 09/01/2018   Tobacco abuse 09/01/2018   Dyslipidemia (high LDL; low HDL) 03/03/2019   Normocytic anemia 01/14/2020   Hypokalemia 01/14/2020   Aortic atherosclerosis ([place]) 01/24/2020   Rib pain on left side 01/24/2020   Panlobular emphysema ([place]) 01/25/2020   Resolved Ambulatory Problems    Diagnosis Date Noted   Acute stress reaction 06/01/2015   Seroma, post-traumatic ([place]) 05/13/2017   Cellulitis of left lower leg 08/04/2017   Wound of left leg 08/04/2017   Past Medical History:  Diagnosis Date   Back disorder    Knee gives out    Other disorders of continuity of bone, right pelvic region and thigh    Stroke [facility])     Review of Systems See HPI.     Objective:  Physical Exam Vitals reviewed.  Constitutional:      Appearance: Normal appearance.  HENT:     Head: Normocephalic.  Cardiovascular:     Rate and Rhythm: Normal rate and regular rhythm.     Pulses: Normal pulses.     Heart sounds: Normal heart sounds.  Pulmonary:     Effort: Pulmonary effort is normal.     Comments: Coarse breath sounds.  Musculoskeletal:     Right lower leg: No edema.     Left lower leg: No edema.  Neurological:      General: No focal deficit present.     Mental Status: She is alert and oriented to person, place, and time.  Psychiatric:        Mood and Affect: Mood normal.        Behavior: Behavior normal.           Assessment & Plan:  Marland KitchenMarland KitchenDevan was seen today for follow-up.  Diagnoses and all orders for this visit:  Bilateral lower extremity edema  Essential hypertension -     hydrochlorothiazide (HYDRODIURIL) 12.5 MG tablet; Take 1 tablet (12.5 mg total) by mouth daily. -     amLODipine (NORVASC) 10 MG tablet; Take 1 tablet (10 mg total) by mouth daily.  Nonrheumatic mitral valve regurgitation  Recurrent major depressive disorder, in full remission (HCC) -     DULoxetine (CYMBALTA) 60 MG capsule; Take 1 capsule (60 mg total) by mouth 2 (two) times daily.  Fibromyalgia -     DULoxetine (CYMBALTA) 60 MG capsule; Take 1 capsule (60 mg total) by mouth 2 (two) times daily.  Hypokalemia -     BASIC METABOLIC PANEL WITH GFR  Rib pain on left side -     DG Ribs Unilateral W/Chest Left  Fall, initial encounter -     DG Ribs Unilateral W/Chest Left  Aortic atherosclerosis (HCC)  Panlobular emphysema (HCC) -     Glycopyrrolate-Formoterol (BEVESPI AEROSPHERE) 9-4.8 MCG/ACT AERO; Inhale 2 puffs into the lungs in the morning and at bedtime.  Tobacco abuse   BMP to recheck potassium. Only take potassium with lasix as needed.  Swelling looks great today.  BP great.  Keep echo appt.  I think less likely CHF with BNP so low.  Discussed compression stockings as needed and low salt diet.   Rib pain left side-order xray to look for fractured ribs.   CT of lungs showed emphysema.  Start bevispi. 2 puffs bid.  Declined smoking cessation.  Follow up in 1 month. Need spirometry.

## 2020-01-25 DIAGNOSIS — R918 Other nonspecific abnormal finding of lung field: Secondary | ICD-10-CM | POA: Insufficient documentation

## 2020-01-25 DIAGNOSIS — J431 Panlobular emphysema: Secondary | ICD-10-CM | POA: Insufficient documentation

## 2020-01-25 LAB — BASIC METABOLIC PANEL WITH GFR
BUN/Creatinine Ratio: 8 (calc) (ref 6–22)
BUN: 6 mg/dL — ABNORMAL LOW (ref 7–25)
CO2: 28 mmol/L (ref 20–32)
Calcium: 9.2 mg/dL (ref 8.6–10.4)
Chloride: 109 mmol/L (ref 98–110)
Creat: 0.71 mg/dL (ref 0.50–1.05)
GFR, Est African American: 109 mL/min/{1.73_m2} (ref >=60)
GFR, Est Non African American: 94 mL/min/{1.73_m2} (ref >=60)
Glucose, Bld: 131 mg/dL — ABNORMAL HIGH (ref 65–99)
Potassium: 3.9 mmol/L (ref 3.5–5.3)
Sodium: 145 mmol/L (ref 135–146)

## 2020-01-25 NOTE — Progress Notes (Signed)
You do have some healing fractures over ribs 7, 8, 9 on the left back area. No acute findings. Seem to be healing well may take weeks for pain to completely resolve. Ice/heat area. Take good deep breaths every day.

## 2020-01-25 NOTE — Progress Notes (Signed)
Potassium looks good. Continue if you take lasix then take potassium to supplement.

## 2020-01-26 ENCOUNTER — Telehealth: Payer: Self-pay | Admitting: Neurology

## 2020-01-26 NOTE — Telephone Encounter (Signed)
Patient states inhaler sent for her at her appt is too expensive ($95 per month) and she is asking if there is an alternative drug?   Also, no Internet access and wants copy of results mailed to patient. Placed in mail.

## 2020-02-01 MED ORDER — TRELEGY ELLIPTA 100-62.5-25 MCG/INH IN AEPB
INHALATION_SPRAY | RESPIRATORY_TRACT | 0 refills | Status: DC
Start: 2020-02-01 — End: 2020-02-09

## 2020-02-01 NOTE — Telephone Encounter (Signed)
Will try trelegy 1 puff once a day.

## 2020-02-02 NOTE — Telephone Encounter (Signed)
Left message on machine for patient letting her know alternative medication sent in.

## 2020-02-04 DIAGNOSIS — Z79899 Other long term (current) drug therapy: Secondary | ICD-10-CM | POA: Diagnosis not present

## 2020-02-04 DIAGNOSIS — Z79891 Long term (current) use of opiate analgesic: Secondary | ICD-10-CM | POA: Diagnosis not present

## 2020-02-04 DIAGNOSIS — M5416 Radiculopathy, lumbar region: Secondary | ICD-10-CM | POA: Diagnosis not present

## 2020-02-04 DIAGNOSIS — G8929 Other chronic pain: Secondary | ICD-10-CM | POA: Diagnosis not present

## 2020-02-08 ENCOUNTER — Telehealth: Payer: Self-pay | Admitting: Neurology

## 2020-02-08 NOTE — Telephone Encounter (Signed)
Patient left a vm stating she thinks she has bronchitis. Wheezing is worse and feels congested. Patient was just seen. Does she need appt?

## 2020-02-09 ENCOUNTER — Telehealth (INDEPENDENT_AMBULATORY_CARE_PROVIDER_SITE_OTHER): Payer: Medicare HMO | Admitting: Physician Assistant

## 2020-02-09 ENCOUNTER — Encounter: Payer: Self-pay | Admitting: Physician Assistant

## 2020-02-09 ENCOUNTER — Other Ambulatory Visit: Payer: Medicare HMO

## 2020-02-09 VITALS — BP 128/64 | Temp 98.7°F | Ht 69.0 in | Wt 187.0 lb

## 2020-02-09 DIAGNOSIS — J431 Panlobular emphysema: Secondary | ICD-10-CM

## 2020-02-09 DIAGNOSIS — J441 Chronic obstructive pulmonary disease with (acute) exacerbation: Secondary | ICD-10-CM

## 2020-02-09 MED ORDER — DOXYCYCLINE HYCLATE 100 MG PO TABS: 100.0000 mg | ORAL_TABLET | Freq: Two times a day (BID) | ORAL | 0 refills | Status: DC

## 2020-02-09 MED ORDER — TRELEGY ELLIPTA 100-62.5-25 MCG/INH IN AEPB: INHALATION_SPRAY | RESPIRATORY_TRACT | 11 refills | Status: DC

## 2020-02-09 MED ORDER — PREDNISONE 20 MG PO TABS: ORAL_TABLET | ORAL | 0 refills | Status: DC

## 2020-02-09 MED ORDER — ALBUTEROL SULFATE HFA 108 (90 BASE) MCG/ACT IN AERS: 2.0000 | INHALATION_SPRAY | RESPIRATORY_TRACT | 1 refills | Status: DC | PRN

## 2020-02-09 NOTE — Addendum Note (Signed)
Addended byAnnamaria Helling on: 02/09/2020 02:52 PM   Modules accepted: Orders

## 2020-02-09 NOTE — Progress Notes (Signed)
Telephone call (no access to internet) Feels like congestion going to chest Worse when laying down Cough - feels like loosening up but not productive yet Has taken sudafed, no other meds, didn't help

## 2020-02-09 NOTE — Telephone Encounter (Signed)
Scheduled appt.

## 2020-02-09 NOTE — Progress Notes (Signed)
Patient ID: Sophia Smith, female   DOB: [birth year], 59 y.o.   MRN: [id number] .Marland KitchenVirtual Visit via Telephone Note  I connected with Sophia Smith on 02/09/20 at  2:00 PM EDT by telephone and verified that I am speaking with the correct person using two identifiers.  Location: Patient: home Provider: clinic   I discussed the limitations, risks, security and privacy concerns of performing an evaluation and management service by telephone and the availability of in person appointments. I also discussed with the patient that there may be a patient responsible charge related to this service. The patient expressed understanding and agreed to proceed.   History of Present Illness: Patient is a 59 year old female with panlobular emphysema, hypertension, chronic venous stasis who calls into the clinic with productive cough, fatigue, shortness of breath, wheezing for the last 2 to 3 days.  She actually called to cancel her spirometry appointment for today because she is feeling so bad. She denies any loss of taste or smell or GI symptoms. No body aches. She is not started her daily inhaler. Bevespi was 95 dollars and Trelegy was 45 dollars. Both are really too expensive but she might be able to make 45 dollars a month work. She does not currently have albuterol to use. She has been using OTC tylenol cold and sinus with little relief. No significant swelling of lower legs. No CP or palpitations.   .. Active Ambulatory Problems    Diagnosis Date Noted  . Lymphedema of leg 09/13/2013  . Pneumothorax on left 09/13/2013  . Left rib fracture 09/13/2013  . Fibromyalgia 09/13/2013  . Lumbar disc disease 09/13/2013  . Aneurysm artery, neck ([place]) 09/15/2013  . Pelvic fracture ([place]) 09/15/2013  . Depression 09/15/2013  . Elevated alkaline phosphatase level 10/15/2013  . Bilateral lower extremity edema 10/26/2013  . Left knee pain 10/26/2013  . Tricompartment degenerative joint disease of knee 10/26/2013   . Plantar fasciitis, bilateral 03/21/2014  . S/P total knee replacement 09/13/2014  . Left ankle pain 09/13/2014  . Insomnia secondary to chronic pain 06/01/2015  . Upper back pain on right side 06/01/2015  . Internal hemorrhoid 12/05/2015  . Iron deficiency anemia 05/13/2017  . Drug-seeking behavior 09/19/2017  . Essential hypertension 09/19/2017  . Opioid dependence with withdrawal ([place]) 09/19/2017  . Chronic bilateral low back pain with bilateral sciatica 09/19/2017  . Cigarette nicotine dependence without complication 77/41/2878  . Lumbar spondylolysis 09/23/2017  . Mitral regurgitation 10/22/2017  . Tricuspid regurgitation 10/22/2017  . Left atrial enlargement 10/22/2017  . Bilateral cataracts 11/04/2017  . Disorder of refraction 11/04/2017  . Thumb pain, left 03/22/2018  . Extensor carpi radialis brevis tenosynovitis 03/22/2018  . Non-intractable vomiting with nausea 03/22/2018  . Hiatal hernia 03/22/2018  . Chronic venous stasis 05/14/2018  . Plantar callus 08/13/2018  . Vertebral artery dissection ([place]) 09/01/2018  . Tobacco abuse 09/01/2018  . Dyslipidemia (high LDL; low HDL) 03/03/2019  . Normocytic anemia 01/14/2020  . Hypokalemia 01/14/2020  . Aortic atherosclerosis ([place]) 01/24/2020  . Rib pain on left side 01/24/2020  . Panlobular emphysema ([place]) 01/25/2020  . Pulmonary nodules 01/25/2020   Resolved Ambulatory Problems    Diagnosis Date Noted  . Acute stress reaction 06/01/2015  . Seroma, post-traumatic ([place]) 05/13/2017  . Cellulitis of left lower leg 08/04/2017  . Wound of left leg 08/04/2017   Past Medical History:  Diagnosis Date  . Back disorder   . Knee gives out   . Other disorders of continuity of bone,  right pelvic region and thigh   . Stroke [facility])    Reviewed med, allergy, problem list.     Observations/Objective: No acute distress.  Productive cough and wheezing heard over phone.  No labored breathing.   .. Today's Vitals   02/09/20  0951  BP: 128/64  Temp: 98.7 F (37.1 C)  TempSrc: Oral  Weight: 187 lb (84.8 kg)  Height: 5\' 9"  (1.753 m)   Body mass index is 27.62 kg/m.    Assessment and Plan: Marland KitchenMarland KitchenDiagnoses and all orders for this visit:  COPD exacerbation ([place]) -     albuterol (VENTOLIN HFA) 108 (90 Base) MCG/ACT inhaler; Inhale 2 puffs into the lungs every 4 (four) hours as needed for wheezing or shortness of breath. -     predniSONE (DELTASONE) 20 MG tablet; Take 3 tablets for 3 days, take 2 tablets for 3 days, take 1 tablet for 3 days, take 1/2 tablet for 4 days. -     doxycycline (VIBRA-TABS) 100 MG tablet; Take 1 tablet (100 mg total) by mouth 2 (two) times daily.  Panlobular emphysema (HCC) -     albuterol (VENTOLIN HFA) 108 (90 Base) MCG/ACT inhaler; Inhale 2 puffs into the lungs every 4 (four) hours as needed for wheezing or shortness of breath.   Treated with doxycycline/prednisone/albuterol as needed every 4-6 hours.  Needs to start trelegy. It is 45 dollars a month. Per patient that is a huge stretch for her. Will print out [place] patient assistance information for patient to see if can get some help with medication.  Once improved reschedule spirometry.    Follow Up Instructions:    I discussed the assessment and treatment plan with the patient. The patient was provided an opportunity to ask questions and all were answered. The patient agreed with the plan and demonstrated an understanding of the instructions.   The patient was advised to call back or seek an in-person evaluation if the symptoms worsen or if the condition fails to improve as anticipated.  I provided  15  minutes of non-face-to-face time during this encounter.   Iran Planas, PA-C

## 2020-02-10 ENCOUNTER — Telehealth: Payer: Medicare HMO | Admitting: Medical-Surgical

## 2020-02-11 ENCOUNTER — Telehealth: Payer: Self-pay | Admitting: Neurology

## 2020-02-11 NOTE — Telephone Encounter (Signed)
Patient assistance paperwork for Trelegy filled out, patient needs to complete her portion and turn in. Paperwork and copy of RX at the front for patient pick up. Patient aware.

## 2020-02-16 ENCOUNTER — Ambulatory Visit (HOSPITAL_BASED_OUTPATIENT_CLINIC_OR_DEPARTMENT_OTHER): Payer: Medicare HMO | Attending: Sports Medicine

## 2020-03-03 DIAGNOSIS — M5416 Radiculopathy, lumbar region: Secondary | ICD-10-CM | POA: Diagnosis not present

## 2020-03-03 DIAGNOSIS — Z79891 Long term (current) use of opiate analgesic: Secondary | ICD-10-CM | POA: Diagnosis not present

## 2020-03-03 DIAGNOSIS — G8929 Other chronic pain: Secondary | ICD-10-CM | POA: Diagnosis not present

## 2020-03-03 DIAGNOSIS — Z79899 Other long term (current) drug therapy: Secondary | ICD-10-CM | POA: Diagnosis not present

## 2020-03-20 ENCOUNTER — Ambulatory Visit: Payer: Medicare HMO

## 2020-03-22 ENCOUNTER — Other Ambulatory Visit: Payer: Self-pay

## 2020-03-22 ENCOUNTER — Ambulatory Visit (INDEPENDENT_AMBULATORY_CARE_PROVIDER_SITE_OTHER): Payer: Medicare HMO | Admitting: Physician Assistant

## 2020-03-22 ENCOUNTER — Ambulatory Visit (HOSPITAL_BASED_OUTPATIENT_CLINIC_OR_DEPARTMENT_OTHER)
Admission: RE | Admit: 2020-03-22 | Discharge: 2020-03-22 | Disposition: A | Discharge status: Home / Self Care | Payer: Medicare HMO | Source: Ambulatory Visit | Attending: Physician Assistant | Admitting: Physician Assistant

## 2020-03-22 VITALS — BP 128/86 | HR 78 | Wt 187.0 lb

## 2020-03-22 DIAGNOSIS — Z96652 Presence of left artificial knee joint: Secondary | ICD-10-CM | POA: Diagnosis not present

## 2020-03-22 DIAGNOSIS — F17200 Nicotine dependence, unspecified, uncomplicated: Secondary | ICD-10-CM | POA: Diagnosis not present

## 2020-03-22 DIAGNOSIS — W19XXXA Unspecified fall, initial encounter: Secondary | ICD-10-CM

## 2020-03-22 DIAGNOSIS — M79605 Pain in left leg: Secondary | ICD-10-CM

## 2020-03-22 DIAGNOSIS — Z0000 Encounter for general adult medical examination without abnormal findings: Secondary | ICD-10-CM | POA: Diagnosis not present

## 2020-03-22 DIAGNOSIS — R600 Localized edema: Secondary | ICD-10-CM | POA: Diagnosis not present

## 2020-03-22 NOTE — Progress Notes (Signed)
Subjective:   Sophia Smith is a 59 y.o. female who presents for Medicare Annual (Subsequent) preventive examination.  Review of Systems    Pt does report left leg pain for the last week just behind left knee and down lateral leg. No swelling. About 2 weeks ago she did fall but does not remember it. She told her daughter and her daughter told her. Hx of knee replacement many years ago. Pt has appt with ortho on September 2nd. Denies any SOB or cough.         Objective:    There were no vitals filed for this visit. There is no height or weight on file to calculate BMI.  Advanced Directives 03/15/2019 11/06/2017 10/10/2017 10/03/2017 09/24/2017 01/12/2014  Does Patient Have a Medical Advance Directive? No Yes Yes Yes Yes Patient has advance directive, copy not in chart  Type of Advance Directive - [place];Living will - [place];Living will Living will Living will;Healthcare Power of Attorney  Does patient want to make changes to medical advance directive? - - No - Patient declined No - Patient declined - -  Copy of Micro in Chart? - - - No - copy requested - -  Would patient like information on creating a medical advance directive? Yes (MAU/Ambulatory/Procedural Areas - Information given) - - No - Patient declined - -    Current Medications (verified) Outpatient Encounter Medications as of 03/22/2020  Medication Sig   albuterol (VENTOLIN HFA) 108 (90 Base) MCG/ACT inhaler Inhale 2 puffs into the lungs every 4 (four) hours as needed for wheezing or shortness of breath.   amitriptyline (ELAVIL) 150 MG tablet Take 1 tablet (150 mg total) by mouth at bedtime.   amLODipine (NORVASC) 10 MG tablet Take 1 tablet (10 mg total) by mouth daily.   atorvastatin (LIPITOR) 40 MG tablet Take 1 tablet (40 mg total) by mouth daily.   cyclobenzaprine (FLEXERIL) 10 MG tablet Take 1 tablet (10 mg total) by mouth at bedtime.   diclofenac sodium  (VOLTAREN) 1 % GEL Apply 4 g topically 4 (four) times daily.   DULoxetine (CYMBALTA) 60 MG capsule Take 1 capsule (60 mg total) by mouth 2 (two) times daily.   Fluticasone-Umeclidin-Vilant (TRELEGY ELLIPTA) 100-62.5-25 MCG/INH AEPB One puff once a day in the morning.   furosemide (LASIX) 40 MG tablet Take 1 tablet (40 mg total) by mouth daily. As needed for lower extremity edema.   gabapentin (NEURONTIN) 600 MG tablet Take 900 mg by mouth 3 (three) times daily.   hydrochlorothiazide (HYDRODIURIL) 12.5 MG tablet Take 1 tablet (12.5 mg total) by mouth daily.   oxyCODONE-acetaminophen (PERCOCET) 10-325 MG tablet Take 1 tablet by mouth every 4 (four) hours as needed for pain.   pantoprazole (PROTONIX) 40 MG tablet TAKE ONE TABLET BY MOUTH DAILY   potassium chloride SA (KLOR-CON) 20 MEQ tablet Take 2 tablets (40 mEq total) by mouth daily.   [DISCONTINUED] doxycycline (VIBRA-TABS) 100 MG tablet Take 1 tablet (100 mg total) by mouth 2 (two) times daily.   [DISCONTINUED] predniSONE (DELTASONE) 20 MG tablet Take 3 tablets for 3 days, take 2 tablets for 3 days, take 1 tablet for 3 days, take 1/2 tablet for 4 days.   No facility-administered encounter medications on file as of 03/22/2020.    Allergies (verified) Morphine and related and Nsaids   History: Past Medical History:  Diagnosis Date   Back disorder    Cellulitis of left lower leg 08/04/2017  Fibromyalgia    Knee gives out    Other disorders of continuity of bone, right pelvic region and thigh    Stroke [employer])    Past Surgical History:  Procedure Laterality Date   CHOLECYSTECTOMY     LEG SURGERY Bilateral    numerous leg surgeries post accident age 42   SIGMOID RESECTION / RECTOPEXY     Family History  Problem Relation Age of Onset   Heart attack Father    Cancer Father    Social History   Socioeconomic History   Marital status: Divorced    Spouse name: Not on file   Number of children: 1   Years of  education: 14   Highest education level: Associate degree: academic program  Occupational History   Occupation: Marine scientist    Comment: retired  Tobacco Use   Smoking status: Current Every Day Smoker    Packs/day: 0.50    Years: 40.00    Pack years: 20.00    Types: Cigarettes    Last attempt to quit: 08/23/2013    Years since quitting: 6.5   Smokeless tobacco: Never Used  Vaping Use   Vaping Use: Never used  Substance and Sexual Activity   Alcohol use: No   Drug use: No   Sexual activity: Not Currently  Other Topics Concern   Not on file  Social History Narrative   Not on file   Social Determinants of Health   Financial Resource Strain:    Difficulty of Paying Living Expenses:   Food Insecurity:    Worried About Charity fundraiser in the Last Year:    Arboriculturist in the Last Year:   Transportation Needs:    Film/video editor (Medical):    Lack of Transportation (Non-Medical):   Physical Activity:    Days of Exercise per Week:    Minutes of Exercise per Session:   Stress:    Feeling of Stress :   Social Connections:    Frequency of Communication with Friends and Family:    Frequency of Social Gatherings with Friends and Family:    Attends Religious Services:    Active Member of Clubs or Organizations:    Attends Archivist Meetings:    Marital Status:     Tobacco Counseling Ready to quit: Not Answered Counseling given: Not Answered   Clinical Intake:                 Diabetic?NO          Activities of Daily Living In your present state of health, do you have any difficulty performing the following activities: 03/22/2020  Hearing? N  Vision? N  Difficulty concentrating or making decisions? N  Walking or climbing stairs? Y  Dressing or bathing? N  Doing errands, shopping? N  Some recent data might be hidden    Patient Care Team: Lavada Mesi as PCP - General (Family Medicine) Awilda Metro, PA-C as Consulting Physician (Pain Medicine)  Indicate any recent Medical Services you may have received from other than [facility] providers in the past year (date may be approximate).     Assessment:   This is a routine wellness examination for Gavina.  Hearing/Vision screen No exam data present  Dietary issues and exercise activities discussed:    Goals     Exercise 3x per week (30 min per time)     Walk at least 3 times a week for short distance and build  tolerance      Depression Screen PHQ 2/9 Scores 09/06/2019 06/09/2019 03/15/2019 01/29/2019 03/10/2018 08/10/2017 06/13/2017  PHQ - 2 Score 1 4 0 2 5 1 2   PHQ- 9 Score 6 10 3 5 12 3  -    Fall Risk Fall Risk  03/15/2019  Falls in the past year? 0  Injury with Fall? 0  Risk for fall due to : Impaired balance/gait  Follow up Falls prevention discussed    Any stairs in or around the home? No  If so, are there any without handrails? No  Home free of loose throw rugs in walkways, pet beds, electrical cords, etc? Yes  Adequate lighting in your home to reduce risk of falls? Yes   ASSISTIVE DEVICES UTILIZED TO PREVENT FALLS:  Life alert? No  Use of a cane, walker or w/c? Yes  Grab bars in the bathroom? Yes  Shower chair or bench in shower? Yes  Elevated toilet seat or a handicapped toilet? Yes   TIMED UP AND GO:  Was the test performed? No .    Gait slow and steady with assistive device  Cognitive Function:     6CIT Screen 03/22/2020 03/15/2019  What Year? 0 points 0 points  What month? 0 points 0 points  What time? 0 points 0 points  Count back from 20 0 points 0 points  Months in reverse 0 points 0 points  Repeat phrase 0 points 0 points  Total Score 0 0    Immunizations Immunization History  Administered Date(s) Administered   Influenza Whole 05/02/2018   Influenza,inj,Quad PF,6+ Mos 04/04/2014, 05/15/2016, 04/28/2017   Influenza-Unspecified 05/01/2012, 05/15/2016   PPD Test 10/03/2009   Td  08/05/2008   Tdap 02/21/2014    TDAP status: Up to date Flu Vaccine status: Up to date Covid-19 vaccine status: Declined, Education has been provided regarding the importance of this vaccine but patient still declined. Advised may receive this vaccine at local pharmacy or Health Dept.or vaccine clinic. Aware to provide a copy of the vaccination record if obtained from local pharmacy or Health Dept. Verbalized acceptance and understanding.  Qualifies for Shingles Vaccine? Yes   Zostavax completed No   Shingrix Completed?: No.    Education has been provided regarding the importance of this vaccine. Patient has been advised to call insurance company to determine out of pocket expense if they have not yet received this vaccine. Advised may also receive vaccine at local pharmacy or Health Dept. Verbalized acceptance and understanding.  Screening Tests Health Maintenance  Topic Date Due   HIV Screening  Never done   INFLUENZA VACCINE  03/05/2020   COVID-19 Vaccine (1) 04/07/2020 (Originally 10/28/1972)   COLONOSCOPY  11/30/2020   MAMMOGRAM  08/10/2021   PAP SMEAR-Modifier  09/19/2021   TETANUS/TDAP  02/22/2024   Hepatitis C Screening  Completed    Health Maintenance  Health Maintenance Due  Topic Date Due   HIV Screening  Never done   INFLUENZA VACCINE  03/05/2020    Colorectal cancer screening: Completed 12/01/2015. Repeat every 5 years Mammogram status: Completed 08/11/2019. Repeat every year   Lung Cancer Screening: (Low Dose CT Chest recommended if Age 53-80 years, 30 pack-year currently smoking OR have quit w/in 15years.) does qualify.   40 year 1 pack a day.   Lung Cancer Screening Referral: will place  Additional Screening:  Hepatitis C Screening: does qualify; Completed 09/06/2019  Vision Screening: Recommended annual ophthalmology exams for early detection of glaucoma and other disorders of the eye.  Is the patient up to date with their annual eye exam?  Yes   Who is the provider or what is the name of the office in which the patient attends annual eye exams? [place] [facility] If pt is not established with a provider, would they like to be referred to a provider to establish care? already established.   Dental Screening: Recommended annual dental exams for proper oral hygiene      Plan:     I have personally reviewed and noted the following in the patients chart:    Medical and social history  Use of alcohol, tobacco or illicit drugs   Current medications and supplements  Functional ability and status  Nutritional status  Physical activity  Advanced directives  List of other physicians  Hospitalizations, surgeries, and ER visits in previous 12 months  Vitals  Screenings to include cognitive, depression, and falls  Referrals and appointments  In addition, I have reviewed and discussed with patient certain preventive protocols, quality metrics, and best practice recommendations. A written personalized care plan for preventive services as well as general preventive health recommendations were provided to patient.   Need to get stat u/s of left lower ext for DVT evaluation. If negative ice/stretch follow up with orthopedic.   Lung cancer screening low dose CT was ordered.   High dose flu shots not in will get in next few months.   Iran Planas, PA-C   03/22/2020

## 2020-03-22 NOTE — Patient Instructions (Signed)
  Sophia Smith , Thank you for taking time to come for your Medicare Wellness Visit. I appreciate your ongoing commitment to your health goals. Please review the following plan we discussed and let me know if I can assist you in the future.   These are the goals we discussed: Goals    . Exercise 3x per week (30 min per time)     Walk at least 3 times a week for short distance and build tolerance       This is a list of the screening recommended for you and due dates:  Health Maintenance  Topic Date Due  . Flu Shot  03/05/2020  . COVID-19 Vaccine (1) 04/07/2020*  . HIV Screening  03/22/2021*  . Colon Cancer Screening  11/30/2020  . Mammogram  08/10/2021  . Pap Smear  09/19/2021  . Tetanus Vaccine  02/22/2024  .  Hepatitis C: One time screening is recommended by Center for Disease Control  (CDC) for  adults born from [birth year] through 1964.   Completed  *Topic was postponed. The date shown is not the original due date.

## 2020-03-23 ENCOUNTER — Other Ambulatory Visit (HOSPITAL_BASED_OUTPATIENT_CLINIC_OR_DEPARTMENT_OTHER): Payer: Medicare HMO

## 2020-03-23 NOTE — Progress Notes (Signed)
No DVT in leg. Follow up with ortho concerning left leg pain.

## 2020-03-27 ENCOUNTER — Encounter: Payer: Self-pay | Admitting: Physician Assistant

## 2020-03-27 DIAGNOSIS — W19XXXA Unspecified fall, initial encounter: Secondary | ICD-10-CM | POA: Insufficient documentation

## 2020-03-27 DIAGNOSIS — F17200 Nicotine dependence, unspecified, uncomplicated: Secondary | ICD-10-CM | POA: Insufficient documentation

## 2020-03-29 ENCOUNTER — Other Ambulatory Visit: Payer: Self-pay | Admitting: Physician Assistant

## 2020-03-31 DIAGNOSIS — G8929 Other chronic pain: Secondary | ICD-10-CM | POA: Diagnosis not present

## 2020-03-31 DIAGNOSIS — Z6830 Body mass index (BMI) 30.0-30.9, adult: Secondary | ICD-10-CM | POA: Diagnosis not present

## 2020-03-31 DIAGNOSIS — M5416 Radiculopathy, lumbar region: Secondary | ICD-10-CM | POA: Diagnosis not present

## 2020-03-31 DIAGNOSIS — Z79899 Other long term (current) drug therapy: Secondary | ICD-10-CM | POA: Diagnosis not present

## 2020-04-05 ENCOUNTER — Other Ambulatory Visit: Payer: Self-pay

## 2020-04-05 ENCOUNTER — Ambulatory Visit (HOSPITAL_BASED_OUTPATIENT_CLINIC_OR_DEPARTMENT_OTHER)
Admission: RE | Admit: 2020-04-05 | Discharge: 2020-04-05 | Disposition: A | Discharge status: Home / Self Care | Payer: Medicare HMO | Source: Ambulatory Visit | Attending: Sports Medicine | Admitting: Sports Medicine

## 2020-04-05 DIAGNOSIS — I340 Nonrheumatic mitral (valve) insufficiency: Secondary | ICD-10-CM | POA: Insufficient documentation

## 2020-04-05 LAB — ECHOCARDIOGRAM COMPLETE
Area-P 1/2: 4.86 cm2
Calc EF: 59.1 %
MV M vel: 5.31 m/s
MV Peak grad: 112.8 mmHg
S' Lateral: 3.32 cm
Single Plane A2C EF: 48.4 %
Single Plane A4C EF: 65.1 %

## 2020-04-05 NOTE — Progress Notes (Signed)
  Echocardiogram 2D Echocardiogram has been performed.  Michiel Cowboy 04/05/2020, 2:27 PM

## 2020-04-06 ENCOUNTER — Ambulatory Visit: Payer: Medicare HMO | Admitting: Orthopaedic Surgery

## 2020-04-13 ENCOUNTER — Other Ambulatory Visit: Payer: Self-pay | Admitting: Physician Assistant

## 2020-04-13 DIAGNOSIS — G8929 Other chronic pain: Secondary | ICD-10-CM

## 2020-04-13 DIAGNOSIS — G4701 Insomnia due to medical condition: Secondary | ICD-10-CM

## 2020-04-18 ENCOUNTER — Encounter: Payer: Self-pay | Admitting: Orthopaedic Surgery

## 2020-04-18 ENCOUNTER — Other Ambulatory Visit: Payer: Self-pay

## 2020-04-18 ENCOUNTER — Ambulatory Visit (INDEPENDENT_AMBULATORY_CARE_PROVIDER_SITE_OTHER): Payer: Medicare HMO

## 2020-04-18 ENCOUNTER — Ambulatory Visit: Payer: Medicare HMO | Admitting: Orthopaedic Surgery

## 2020-04-18 VITALS — Ht 69.0 in | Wt 187.0 lb

## 2020-04-18 DIAGNOSIS — M21611 Bunion of right foot: Secondary | ICD-10-CM | POA: Diagnosis not present

## 2020-04-18 DIAGNOSIS — M79671 Pain in right foot: Secondary | ICD-10-CM

## 2020-04-18 DIAGNOSIS — M25561 Pain in right knee: Secondary | ICD-10-CM

## 2020-04-18 DIAGNOSIS — G8929 Other chronic pain: Secondary | ICD-10-CM

## 2020-04-18 DIAGNOSIS — M810 Age-related osteoporosis without current pathological fracture: Secondary | ICD-10-CM | POA: Diagnosis not present

## 2020-04-18 DIAGNOSIS — M1711 Unilateral primary osteoarthritis, right knee: Secondary | ICD-10-CM

## 2020-04-18 MED ORDER — METHYLPREDNISOLONE ACETATE 40 MG/ML IJ SUSP
80.0000 mg | INTRAMUSCULAR | Status: AC | PRN
  Administered 2020-04-18: 80 mg via INTRA_ARTICULAR

## 2020-04-18 MED ORDER — BUPIVACAINE HCL 0.5 % IJ SOLN
2.0000 mL | INTRAMUSCULAR | Status: AC | PRN
  Administered 2020-04-18: 2 mL via INTRA_ARTICULAR

## 2020-04-18 MED ORDER — LIDOCAINE HCL 1 % IJ SOLN
2.0000 mL | INTRAMUSCULAR | Status: AC | PRN
  Administered 2020-04-18: 2 mL

## 2020-04-18 NOTE — Addendum Note (Signed)
Addended by: Lendon Collar on: 04/18/2020 03:49 PM   Modules accepted: Orders

## 2020-04-18 NOTE — Progress Notes (Signed)
Office Visit Note   Patient: Sophia Smith           Date of Birth: 1961-07-19           MRN: [id number] Visit Date: 04/18/2020              Requested by: Donella Stade, PA-C [facility] [place],  [place] [address] PCP: Donella Stade, PA-C   Assessment & Plan: Visit Diagnoses:  1. Pain in right foot   2. Chronic pain of right knee   3. Tricompartment osteoarthritis of right knee   4. Bunion of great toe of right foot     Plan: Chronic osteoarthritis right knee.  Status post left total knee replacement many years ago.  Long discussion regarding treatment options including cortisone, repeat viscosupplementation or even knee replacement.  She would like to proceed with a cortisone injection.  This was performed without difficulty  There is evidence of decreased bone density.  Patient is to have been evaluated for osteoporosis.  We order a DEXA scan.  Will refer to Dr. Sharol Given for bunion surgery  Follow-Up Instructions: Return Refer to Dr. Sharol Given for bunion surgery, bone density study.   Orders:  Orders Placed This Encounter  Procedures  . XR KNEE 3 VIEW RIGHT  . XR Foot Complete Right   No orders of the defined types were placed in this encounter.     Procedures: Large Joint Inj: R knee on 04/18/2020 3:31 PM Indications: pain and diagnostic evaluation Details: 25 G 1.5 in needle, anteromedial approach  Arthrogram: No  Medications: 2 mL lidocaine 1 %; 2 mL bupivacaine 0.5 %; 80 mg methylPREDNISolone acetate 40 MG/ML Procedure, treatment alternatives, risks and benefits explained, specific risks discussed. Consent was given by the patient. Immediately prior to procedure a time out was called to verify the correct patient, procedure, equipment, support staff and site/side marked as required. Patient was prepped and draped in the usual sterile fashion.       Clinical Data: No additional findings.   Subjective: Chief Complaint  Patient presents  with  . Right Knee - Pain  . Right Foot - Pain  Patient presents today for her right foot and right knee. She states that she has a bunion on her right foot that is causing her great toe to push in on all the others. She states that the bunion has been there for at least two years, and worsening. She states that it hurts constantly, and worse walking barefoot. She also states that there is a callus on the bottom of her foot that bleeds when she tries to file it down.  Her right knee is painful. She finished the orthovisc injection in January of 2020. She states that it started hurting again at the beginning of summer. She has pain and swelling medially. Her pain is only present with weightbearing. She takes Neurontin and also goes to pain management for percocet 10/325.  His chronic issues with her lumbar spine and is seeing Dr. Lorin Mercy for consideration of fusion Has been seen in the past with osteoarthritis of her right knee and has been through a course of viscosupplementation as mentioned above.  She would like to have a cortisone injection today. Her biggest issue is a painful bunion of her right great toe associated with calluses on the plantar aspect of her feet.  She has had bunions for years and at this point would like to consider surgical correction  HPI  Review of Systems  Constitutional: Negative for fatigue.  HENT: Negative for ear pain.   Eyes: Negative for pain.  Respiratory: Negative for shortness of breath.   Cardiovascular: Negative for leg swelling.  Gastrointestinal: Negative for constipation and diarrhea.  Endocrine: Negative for cold intolerance and heat intolerance.  Genitourinary: Negative for difficulty urinating.  Musculoskeletal: Positive for joint swelling.  Skin: Negative for rash.  Allergic/Immunologic: Negative for food allergies.  Neurological: Positive for weakness.  Hematological: Does not bruise/bleed easily.  Psychiatric/Behavioral: Positive for sleep  disturbance.     Objective: Vital Signs: Ht 5\' 9"  (1.753 m)   Wt 187 lb (84.8 kg)   LMP 12/11/2012   BMI 27.62 kg/m   Physical Exam Constitutional:      Appearance: She is well-developed.  Eyes:     Pupils: Pupils are equal, round, and reactive to light.  Pulmonary:     Effort: Pulmonary effort is normal.  Skin:    General: Skin is warm and dry.  Neurological:     Mental Status: She is alert and oriented to person, place, and time.  Psychiatric:        Behavior: Behavior normal.     Ortho Exam awake alert and oriented x3.  Comfortable sitting.  Examination of her right knee there appeared to be slight increased valgus with weightbearing and mostly lateral joint pain.  Some patella crepitation.  No obvious effusion.  No instability.  Full extension and flexion over 100 degrees.  No popliteal mass.  No calf pain.  Right foot with surgical absence of the fifth metatarsal from an old injury when she was 59 years old.  There are several painful calluses on the plantar aspect of the second and first metatarsal heads.  Has hammer hammering of the second and third toes and a large bunion with tenderness over the first metatarsal head medially.  The great toe abuts the second with a dorsal callus on the second from the toe pressure.  Good capillary refill.  Normal sensation.  Does have some limited motion of the metatarsal phalangeal joint great toe related to the arthritis identified on x-ray.  Hammering is correctable to neutral  Specialty Comments:  No specialty comments available.  Imaging: XR Foot Complete Right  Result Date: 04/18/2020 Films of the right foot were obtained in several projections.  There is a large bunion associated with degenerative changes at the first metatarsal phalangeal joint.  There is about 10 degree angulation between the first and second metatarsal.  There is surgical absence of the fifth metatarsal to its base from an old injury when the patient was younger  no acute changes  XR KNEE 3 VIEW RIGHT  Result Date: 04/18/2020 Films of the right knee obtained in several projections standing.  Alignment appears to be neutral.  There are degenerative changes in all 3 compartments.  Laterally there is a little sclerosis in the subchondral region of the tibia with peripheral osteophytes.  Joint space is minimally narrowed.  There is evidence of patellofemoral arthritis and medial compartment arthritis as well but to a lesser extent.  Films are consistent with moderate osteoarthritis.  No acute changes or ectopic calcification.  Bone density appears to be decreased    PMFS History: Patient Active Problem List   Diagnosis Date Noted  . Bunion of great toe of right foot 04/18/2020  . Fall 03/27/2020  . Current every day smoker 03/27/2020  . Status post left knee replacement 03/22/2020  . Panlobular emphysema ([place]) 01/25/2020  .  Pulmonary nodules 01/25/2020  . Aortic atherosclerosis ([place]) 01/24/2020  . Rib pain on left side 01/24/2020  . Normocytic anemia 01/14/2020  . Hypokalemia 01/14/2020  . Dyslipidemia (high LDL; low HDL) 03/03/2019  . Vertebral artery dissection ([place]) 09/01/2018  . Tobacco abuse 09/01/2018  . Plantar callus 08/13/2018  . Chronic venous stasis 05/14/2018  . Thumb pain, left 03/22/2018  . Extensor carpi radialis brevis tenosynovitis 03/22/2018  . Non-intractable vomiting with nausea 03/22/2018  . Hiatal hernia 03/22/2018  . Bilateral cataracts 11/04/2017  . Disorder of refraction 11/04/2017  . Mitral regurgitation 10/22/2017  . Tricuspid regurgitation 10/22/2017  . Left atrial enlargement 10/22/2017  . Lumbar spondylolysis 09/23/2017  . Drug-seeking behavior 09/19/2017  . Essential hypertension 09/19/2017  . Opioid dependence with withdrawal ([place]) 09/19/2017  . Chronic bilateral low back pain with bilateral sciatica 09/19/2017  . Cigarette nicotine dependence without complication [id number]/91/9166  . Iron deficiency anemia  05/13/2017  . Internal hemorrhoid 12/05/2015  . Insomnia secondary to chronic pain 06/01/2015  . Upper back pain on right side 06/01/2015  . S/P total knee replacement 09/13/2014  . Left ankle pain 09/13/2014  . Plantar fasciitis, bilateral 03/21/2014  . Bilateral lower extremity edema 10/26/2013  . Left knee pain 10/26/2013  . Tricompartment degenerative joint disease of knee 10/26/2013  . Elevated alkaline phosphatase level 10/15/2013  . Aneurysm artery, neck ([place]) 09/15/2013  . Pelvic fracture ([place]) 09/15/2013  . Depression 09/15/2013  . Lymphedema of leg 09/13/2013  . Pneumothorax on left 09/13/2013  . Left rib fracture 09/13/2013  . Fibromyalgia 09/13/2013  . Lumbar disc disease 09/13/2013   Past Medical History:  Diagnosis Date  . Back disorder   . Cellulitis of left lower leg 08/04/2017  . Fibromyalgia   . Knee gives out   . Other disorders of continuity of bone, right pelvic region and thigh   . Stroke [facility])     Family History  Problem Relation Age of Onset  . Heart attack Father   . Cancer Father     Past Surgical History:  Procedure Laterality Date  . CHOLECYSTECTOMY    . LEG SURGERY Bilateral    numerous leg surgeries post accident age 5  . SIGMOID RESECTION / RECTOPEXY     Social History   Occupational History  . Occupation: nurse    Comment: retired  Tobacco Use  . Smoking status: Current Every Day Smoker    Packs/day: 0.50    Years: 40.00    Pack years: 20.00    Types: Cigarettes    Last attempt to quit: 08/23/2013    Years since quitting: 6.6  . Smokeless tobacco: Never Used  Vaping Use  . Vaping Use: Never used  Substance and Sexual Activity  . Alcohol use: No  . Drug use: No  . Sexual activity: Not Currently

## 2020-04-24 ENCOUNTER — Ambulatory Visit (INDEPENDENT_AMBULATORY_CARE_PROVIDER_SITE_OTHER): Payer: Medicare HMO | Admitting: Orthopedic Surgery

## 2020-04-24 ENCOUNTER — Encounter: Payer: Self-pay | Admitting: Orthopedic Surgery

## 2020-04-24 VITALS — Ht 69.0 in | Wt 187.0 lb

## 2020-04-24 DIAGNOSIS — M2011 Hallux valgus (acquired), right foot: Secondary | ICD-10-CM | POA: Diagnosis not present

## 2020-04-24 DIAGNOSIS — M205X1 Other deformities of toe(s) (acquired), right foot: Secondary | ICD-10-CM

## 2020-04-24 DIAGNOSIS — M2021 Hallux rigidus, right foot: Secondary | ICD-10-CM | POA: Diagnosis not present

## 2020-04-24 DIAGNOSIS — M79671 Pain in right foot: Secondary | ICD-10-CM | POA: Diagnosis not present

## 2020-04-28 DIAGNOSIS — G8929 Other chronic pain: Secondary | ICD-10-CM | POA: Diagnosis not present

## 2020-04-28 DIAGNOSIS — Z6830 Body mass index (BMI) 30.0-30.9, adult: Secondary | ICD-10-CM | POA: Diagnosis not present

## 2020-04-28 DIAGNOSIS — Z79899 Other long term (current) drug therapy: Secondary | ICD-10-CM | POA: Diagnosis not present

## 2020-04-28 DIAGNOSIS — M5416 Radiculopathy, lumbar region: Secondary | ICD-10-CM | POA: Diagnosis not present

## 2020-04-29 ENCOUNTER — Encounter: Payer: Self-pay | Admitting: Orthopedic Surgery
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
## 2020-05-27 DIAGNOSIS — G8929 Other chronic pain: Secondary | ICD-10-CM | POA: Diagnosis not present

## 2020-05-27 DIAGNOSIS — M5416 Radiculopathy, lumbar region: Secondary | ICD-10-CM | POA: Diagnosis not present

## 2020-05-27 DIAGNOSIS — Z79899 Other long term (current) drug therapy: Secondary | ICD-10-CM | POA: Diagnosis not present

## 2020-05-27 DIAGNOSIS — Z6830 Body mass index (BMI) 30.0-30.9, adult: Secondary | ICD-10-CM | POA: Diagnosis not present

## 2020-06-21 ENCOUNTER — Other Ambulatory Visit: Payer: Self-pay | Admitting: *Deleted

## 2020-06-21 DIAGNOSIS — Z87891 Personal history of nicotine dependence: Secondary | ICD-10-CM

## 2020-06-21 DIAGNOSIS — F17210 Nicotine dependence, cigarettes, uncomplicated: Secondary | ICD-10-CM

## 2020-06-24 DIAGNOSIS — M5416 Radiculopathy, lumbar region: Secondary | ICD-10-CM | POA: Diagnosis not present

## 2020-06-24 DIAGNOSIS — F17210 Nicotine dependence, cigarettes, uncomplicated: Secondary | ICD-10-CM | POA: Diagnosis not present

## 2020-06-24 DIAGNOSIS — Z79899 Other long term (current) drug therapy: Secondary | ICD-10-CM | POA: Diagnosis not present

## 2020-06-24 DIAGNOSIS — G8929 Other chronic pain: Secondary | ICD-10-CM | POA: Diagnosis not present

## 2020-06-24 DIAGNOSIS — Z79891 Long term (current) use of opiate analgesic: Secondary | ICD-10-CM | POA: Diagnosis not present

## 2020-07-04 ENCOUNTER — Other Ambulatory Visit: Payer: Self-pay | Admitting: Physician Assistant

## 2020-07-04 DIAGNOSIS — Z1231 Encounter for screening mammogram for malignant neoplasm of breast: Secondary | ICD-10-CM

## 2020-07-10 ENCOUNTER — Encounter: Payer: Self-pay | Admitting: Orthopedic Surgery

## 2020-07-10 ENCOUNTER — Ambulatory Visit: Payer: Medicare HMO | Admitting: Orthopedic Surgery

## 2020-07-10 VITALS — Ht 69.0 in | Wt 187.0 lb

## 2020-07-10 DIAGNOSIS — M2011 Hallux valgus (acquired), right foot: Secondary | ICD-10-CM | POA: Diagnosis not present

## 2020-07-10 DIAGNOSIS — M2021 Hallux rigidus, right foot: Secondary | ICD-10-CM

## 2020-07-10 DIAGNOSIS — M205X1 Other deformities of toe(s) (acquired), right foot: Secondary | ICD-10-CM | POA: Diagnosis not present
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
## 2020-07-17 ENCOUNTER — Ambulatory Visit (INDEPENDENT_AMBULATORY_CARE_PROVIDER_SITE_OTHER): Payer: Medicare HMO

## 2020-07-17 ENCOUNTER — Other Ambulatory Visit: Payer: Self-pay

## 2020-07-17 ENCOUNTER — Ambulatory Visit (INDEPENDENT_AMBULATORY_CARE_PROVIDER_SITE_OTHER): Payer: Medicare HMO | Admitting: Acute Care

## 2020-07-17 ENCOUNTER — Encounter: Payer: Self-pay | Admitting: Acute Care

## 2020-07-17 DIAGNOSIS — Z122 Encounter for screening for malignant neoplasm of respiratory organs: Secondary | ICD-10-CM

## 2020-07-17 DIAGNOSIS — Z87891 Personal history of nicotine dependence: Secondary | ICD-10-CM

## 2020-07-17 DIAGNOSIS — F17210 Nicotine dependence, cigarettes, uncomplicated: Secondary | ICD-10-CM | POA: Diagnosis not present

## 2020-07-17 NOTE — Patient Instructions (Signed)
Thank you for participating in the [facility] Scre[facility] Program. It was our pleasure to meet you today. We will call you with the results of your scan within the next few days. Your scan will be assigned a Lung RADS category score by the physicians reading the scans.  This Lung RADS score determines follow up scanning.  See below for description of categories, and follow up screening recommendations. We will be in touch to schedule your follow up screening annually or based on recommendations of our providers. We will fax a copy of your scan results to your Primary Care Physician, or the physician who referred you to the program, to ensure they have the results. Please call the office if you have any questions or concerns regarding your scanning experience or results.  Our office number is [phone]. Please speak with Denise Phelps, RN. She is our Lung Cancer Screening RN. If she is unavailable when you call, please have the office staff send her a message. She will return your call at her earliest convenience. Remember, if your scan is normal, we will scan you annually as long as you continue to meet the criteria for the program. (Age 55-77, Current smoker or smoker who has quit within the last 15 years). If you are a smoker, remember, quitting is the single most powerful action that you can take to decrease your risk of lung cancer and other pulmonary, breathing related problems. We know quitting is hard, and we are here to help.  Please let us know if there is anything we can do to help you meet your goal of quitting. If you are a former smoker, congratulations. We are proud of you! Remain smoke free! Remember you can refer friends or family members through the number above.  We will screen them to make sure they meet criteria for the program. Thank you for helping us take better care of you by participating in Lung Screening.  Lung RADS Categories:  Lung RADS 1: no nodules  or definitely non-concerning nodules.  Recommendation is for a repeat annual scan in 12 months.  Lung RADS 2:  nodules that are non-concerning in appearance and behavior with a very low likelihood of becoming an active cancer. Recommendation is for a repeat annual scan in 12 months.  Lung RADS 3: nodules that are probably non-concerning , includes nodules with a low likelihood of becoming an active cancer.  Recommendation is for a 6-month repeat screening scan. Often noted after an upper respiratory illness. We will be in touch to make sure you have no questions, and to schedule your 6-month scan.  Lung RADS 4 A: nodules with concerning findings, recommendation is most often for a follow up scan in 3 months or additional testing based on our provider's assessment of the scan. We will be in touch to make sure you have no questions and to schedule the recommended 3 month follow up scan.  Lung RADS 4 B:  indicates findings that are concerning. We will be in touch with you to schedule additional diagnostic testing based on our provider's  assessment of the scan.   

## 2020-07-17 NOTE — Progress Notes (Signed)
Shared Decision Making Visit Lung Cancer Screening Program [phone])  Virtual Visit via Telephone Note  I connected with Sophia Smith on 07/17/20 at 12:00 PM EST by telephone and verified that I am speaking with the correct person using two identifiers.  Location: Patient: At home Provider:  [address], [place], [place], [address]   I discussed the limitations, risks, security and privacy concerns of performing an evaluation and management service by telephone and the availability of in person appointments. I also discussed with the patient that there may be a patient responsible charge related to this service. The patient expressed understanding and agreed to proceed.    Eligibility:  Age 59 y.o.  Pack Years Smoking History Calculation 30 pack year smoking history (# packs/per year x # years smoked)  Recent History of coughing up blood  no  Unexplained weight loss? no ( >Than 15 pounds within the last 6 months )  Prior History Lung / other cancer no (Diagnosis within the last 5 years already requiring surveillance chest CT Scans).  Smoking Status Current Smoker  Former Smokers: Years since quit:NA  Quit Date: NA  Visit Components:  Discussion included one or more decision making aids. yes  Discussion included risk/benefits of screening. yes  Discussion included potential follow up diagnostic testing for abnormal scans. yes  Discussion included meaning and risk of over diagnosis. yes  Discussion included meaning and risk of False Positives. yes  Discussion included meaning of total radiation exposure. yes  Counseling Included:  Importance of adherence to annual lung cancer LDCT screening. yes  Impact of comorbidities on ability to participate in the program. yes  Ability and willingness to under diagnostic treatment. yes  Smoking Cessation Counseling:  Current Smokers:   Discussed importance of smoking cessation. yes  Information about tobacco  cessation classes and interventions provided to patient. yes  Patient provided with "ticket" for LDCT Scan. yes  Symptomatic Patient. no  Counseling NA  Diagnosis Code: Tobacco Use Z72.0  Asymptomatic Patient yes  Counseling (Intermediate counseling: > three minutes counseling) Y3016  Former Smokers:   Discussed the importance of maintaining cigarette abstinence. yes  Diagnosis Code: Personal History of Nicotine Dependence. W10.932  Information about tobacco cessation classes and interventions provided to patient. Yes  Patient provided with "ticket" for LDCT Scan. yes  Written Order for Lung Cancer Screening with LDCT placed in Epic. Yes (CT Chest Lung Cancer Screening Low Dose W/O CM) [id number] Z12.2-Screening of respiratory organs Z87.891-Personal history of nicotine dependence  I have spent 25 minutes of face to face time with Sophia Smith discussing the risks and benefits of lung cancer screening. We viewed a power point together that explained in detail the above noted topics. We paused at intervals to allow for questions to be asked and answered to ensure understanding.We discussed that the single most powerful action that she can take to decrease her risk of developing lung cancer is to quit smoking. We discussed whether or not she is ready to commit to setting a quit date. We discussed options for tools to aid in quitting smoking including nicotine replacement therapy, non-nicotine medications, support groups, Quit Smart classes, and behavior modification. We discussed that often times setting smaller, more achievable goals, such as eliminating 1 cigarette a day for a week and then 2 cigarettes a day for a week can be helpful in slowly decreasing the number of cigarettes smoked. This allows for a sense of accomplishment as well as providing a clinical benefit. I  gave her the " Be Stronger Than Your Excuses" card with contact information for community resources, classes, free nicotine  replacement therapy, and access to mobile apps, text messaging, and on-line smoking cessation help. I have also given her my card and contact information in the event she needs to contact me. We discussed the time and location of the scan, and that either Doroteo Glassman RN or I will call with the results within 24-48 hours of receiving them. I have offered her  a copy of the power point we viewed  as a resource in the event they need reinforcement of the concepts we discussed today in the office. The patient verbalized understanding of all of  the above and had no further questions upon leaving the office. They have my contact information in the event they have any further questions.  I spent 3 minutes counseling on smoking cessation and the health risks of continued tobacco abuse.  I explained to the patient that there has been a high incidence of coronary artery disease noted on these exams. I explained that this is a non-gated exam therefore degree or severity cannot be determined. This patient is on statin therapy. I have asked the patient to follow-up with their PCP regarding any incidental finding of coronary artery disease and management with diet or medication as their PCP  feels is clinically indicated. The patient verbalized understanding of the above and had no further questions upon completion of the visit.      Magdalen Spatz, NP 07/17/2020 1:31 PM

## 2020-07-21 NOTE — Progress Notes (Signed)
Please call patient and let them  know their  low dose Ct was read as a Lung RADS 2: nodules that are benign in appearance and behavior with a very low likelihood of becoming a clinically active cancer due to size or lack of growth. Recommendation per radiology is for a repeat LDCT in 12 months. .Please let them  know we will order and schedule their  annual screening scan for 07/2021. Please let them  know there was notation of CAD on their  scan.  Please remind the patient  that this is a non-gated exam therefore degree or severity of disease  cannot be determined. Please have them  follow up with their PCP regarding potential risk factor modification, dietary therapy or pharmacologic therapy if clinically indicated. Pt.  is  currently on statin therapy. Please place order for annual  screening scan for  07/2021 and fax results to PCP. Thanks so much.  This patient is followed by cards and has had an echo.

## 2020-07-22 DIAGNOSIS — M5416 Radiculopathy, lumbar region: Secondary | ICD-10-CM | POA: Diagnosis not present

## 2020-07-22 DIAGNOSIS — Z79899 Other long term (current) drug therapy: Secondary | ICD-10-CM | POA: Diagnosis not present

## 2020-07-22 DIAGNOSIS — Z79891 Long term (current) use of opiate analgesic: Secondary | ICD-10-CM | POA: Diagnosis not present

## 2020-07-22 DIAGNOSIS — F17210 Nicotine dependence, cigarettes, uncomplicated: Secondary | ICD-10-CM | POA: Diagnosis not present

## 2020-07-22 DIAGNOSIS — G8929 Other chronic pain: Secondary | ICD-10-CM | POA: Diagnosis not present

## 2020-07-26 ENCOUNTER — Other Ambulatory Visit: Payer: Self-pay | Admitting: *Deleted

## 2020-07-26 DIAGNOSIS — Z87891 Personal history of nicotine dependence: Secondary | ICD-10-CM

## 2020-07-26 DIAGNOSIS — F17210 Nicotine dependence, cigarettes, uncomplicated: Secondary | ICD-10-CM

## 2020-07-31 ENCOUNTER — Encounter: Payer: Self-pay | Admitting: Physician Assistant

## 2020-07-31 DIAGNOSIS — J432 Centrilobular emphysema: Secondary | ICD-10-CM | POA: Insufficient documentation

## 2020-08-09 ENCOUNTER — Other Ambulatory Visit: Payer: Self-pay | Admitting: Physician Assistant

## 2020-08-16 ENCOUNTER — Other Ambulatory Visit: Payer: Self-pay | Admitting: Physician Assistant

## 2020-08-16 ENCOUNTER — Inpatient Hospital Stay (HOSPITAL_COMMUNITY): Admission: RE | Admit: 2020-08-16 | Payer: Medicare HMO | Source: Ambulatory Visit

## 2020-08-16 DIAGNOSIS — I10 Essential (primary) hypertension: Secondary | ICD-10-CM

## 2020-08-18 ENCOUNTER — Ambulatory Visit (HOSPITAL_COMMUNITY): Admission: RE | Admit: 2020-08-18 | Payer: Medicare HMO | Source: Home / Self Care | Admitting: Orthopedic Surgery

## 2020-08-18 ENCOUNTER — Encounter (HOSPITAL_COMMUNITY): Admission: RE | Payer: Self-pay | Source: Home / Self Care

## 2020-08-18 DIAGNOSIS — M5136 Other intervertebral disc degeneration, lumbar region: Secondary | ICD-10-CM | POA: Diagnosis not present

## 2020-08-18 DIAGNOSIS — Z79899 Other long term (current) drug therapy: Secondary | ICD-10-CM | POA: Diagnosis not present

## 2020-08-18 SURGERY — FUSION, JOINT, GREAT TOE
Anesthesia: Choice | Site: Toe | Laterality: Right

## 2020-08-24 ENCOUNTER — Other Ambulatory Visit: Payer: Self-pay | Admitting: Physician Assistant

## 2020-08-24 DIAGNOSIS — E782 Mixed hyperlipidemia: Secondary | ICD-10-CM

## 2020-08-29 DIAGNOSIS — H26493 Other secondary cataract, bilateral: Secondary | ICD-10-CM | POA: Diagnosis not present

## 2020-08-29 DIAGNOSIS — Z135 Encounter for screening for eye and ear disorders: Secondary | ICD-10-CM | POA: Diagnosis not present

## 2020-08-29 DIAGNOSIS — Z0100 Encounter for examination of eyes and vision without abnormal findings: Secondary | ICD-10-CM | POA: Diagnosis not present

## 2020-09-05 ENCOUNTER — Other Ambulatory Visit: Payer: Self-pay

## 2020-09-05 ENCOUNTER — Ambulatory Visit (INDEPENDENT_AMBULATORY_CARE_PROVIDER_SITE_OTHER): Payer: Medicare HMO | Admitting: Physician Assistant

## 2020-09-05 ENCOUNTER — Encounter: Payer: Self-pay | Admitting: Physician Assistant

## 2020-09-05 VITALS — BP 134/79 | HR 92 | Ht 69.0 in | Wt 174.0 lb

## 2020-09-05 DIAGNOSIS — I872 Venous insufficiency (chronic) (peripheral): Secondary | ICD-10-CM

## 2020-09-05 DIAGNOSIS — M25472 Effusion, left ankle: Secondary | ICD-10-CM

## 2020-09-05 DIAGNOSIS — I878 Other specified disorders of veins: Secondary | ICD-10-CM

## 2020-09-05 MED ORDER — FUROSEMIDE 10 MG/ML IJ SOLN
20.0000 mg | Freq: Once | INTRAMUSCULAR | Status: AC
  Administered 2020-09-05: 20 mg via INTRAMUSCULAR

## 2020-09-05 MED ORDER — FUROSEMIDE 10 MG/ML IJ SOLN
20.0000 mg | Freq: Once | INTRAMUSCULAR | Status: AC
Start: 2020-09-05 — End: 2020-09-05
  Administered 2020-09-05: 20 mg via INTRAMUSCULAR

## 2020-09-05 MED ORDER — TRIAMCINOLONE ACETONIDE 0.1 % EX CREA: 1.0000 "application " | TOPICAL_CREAM | Freq: Two times a day (BID) | CUTANEOUS | 1 refills | Status: DC

## 2020-09-05 NOTE — Progress Notes (Addendum)
Acute Office Visit  Subjective:    Patient ID: Sophia Smith, female    DOB: Jan 18, 1961, 60 y.o.   MRN: [id number]  Chief Complaint  Patient presents with  . Ankle Pain    60 year old woman presents with an acute worsening of her lower left leg edema and dryness and pruritis bilaterally in ankles.  History significant for venous stasis and edema of LLE.  She has noticed worsening swelling in her left ankle for the last week and itching and dryness in both ankles for the last several weeks.    She took lasix for her swelling yesterday, and it appears to be helping with her swelling.  She is concerned about hardening of her skin around an old chronic wound.  OTC corticosteroids have not helped her ankle itching and dryness.    Ankle Pain    No recent injuries, falls, sprains. No fever, chills.   Past Medical History:  Diagnosis Date  . Back disorder   . Cellulitis of left lower leg 08/04/2017  . Fibromyalgia   . Knee gives out   . Other disorders of continuity of bone, right pelvic region and thigh   . Stroke [facility])     Past Surgical History:  Procedure Laterality Date  . CHOLECYSTECTOMY    . LEG SURGERY Bilateral    numerous leg surgeries post accident age 68  . SIGMOID RESECTION / RECTOPEXY      Family History  Problem Relation Age of Onset  . Heart attack Father   . Cancer Father     Social History   Socioeconomic History  . Marital status: Divorced    Spouse name: Not on file  . Number of children: 1  . Years of education: 80  . Highest education level: Associate degree: academic program  Occupational History  . Occupation: nurse    Comment: retired  Tobacco Use  . Smoking status: Current Every Day Smoker    Packs/day: 0.75    Years: 40.00    Pack years: 30.00    Types: Cigarettes    Last attempt to quit: 08/23/2013    Years since quitting: 7.0  . Smokeless tobacco: Never Used  Vaping Use  . Vaping Use: Never used  Substance and Sexual Activity   . Alcohol use: No  . Drug use: No  . Sexual activity: Not Currently  Other Topics Concern  . Not on file  Social History Narrative  . Not on file   Social Determinants of Health   Financial Resource Strain: Not on file  Food Insecurity: Not on file  Transportation Needs: Not on file  Physical Activity: Not on file  Stress: Not on file  Social Connections: Not on file  Intimate Partner Violence: Not on file    Outpatient Medications Prior to Visit  Medication Sig Dispense Refill  . albuterol (VENTOLIN HFA) 108 (90 Base) MCG/ACT inhaler Inhale 2 puffs into the lungs every 4 (four) hours as needed for wheezing or shortness of breath. 18 g 1  . amitriptyline (ELAVIL) 150 MG tablet TAKE ONE TABLET BY MOUTH AT BEDTIME (Patient taking differently: Take 150 mg by mouth at bedtime.) 90 tablet 1  . amLODipine (NORVASC) 10 MG tablet Take 1 tablet (10 mg total) by mouth daily. 90 tablet 1  . atorvastatin (LIPITOR) 40 MG tablet Take 1 tablet (40 mg total) by mouth daily. NEEDS LABS 30 tablet 0  . cyclobenzaprine (FLEXERIL) 10 MG tablet Take 1 tablet (10 mg total) by  mouth at bedtime. (Patient taking differently: Take 10 mg by mouth daily as needed for muscle spasms.) 30 tablet 5  . diclofenac sodium (VOLTAREN) 1 % GEL Apply 4 g topically 4 (four) times daily. (Patient taking differently: Apply 4 g topically daily as needed (hip pain).) 1 Tube 3  . DULoxetine (CYMBALTA) 60 MG capsule Take 1 capsule (60 mg total) by mouth 2 (two) times daily. 180 capsule 1  . Fluticasone-Umeclidin-Vilant (TRELEGY ELLIPTA) 100-62.5-25 MCG/INH AEPB One puff once a day in the morning. 60 each 11  . furosemide (LASIX) 40 MG tablet Take 1 tablet (40 mg total) by mouth daily. As needed for lower extremity edema. (Patient taking differently: Take 40 mg by mouth daily as needed for fluid or edema.) 30 tablet 0  . gabapentin (NEURONTIN) 300 MG capsule Take 900 mg by mouth 3 (three) times daily.    . hydrochlorothiazide  (HYDRODIURIL) 12.5 MG tablet Take 1 tablet (12.5 mg total) by mouth daily. Labs for further refills 90 tablet 0  . NARCAN 4 MG/0.1ML LIQD nasal spray kit Place 1 spray into the nose once.    Marland Kitchen oxyCODONE-acetaminophen (PERCOCET) 10-325 MG tablet Take 1 tablet by mouth every 4 (four) hours as needed for pain (Back).    . pantoprazole (PROTONIX) 40 MG tablet TAKE ONE TABLET BY MOUTH DAILY (Patient taking differently: Take 40 mg by mouth daily.) 90 tablet 3  . potassium chloride SA (KLOR-CON) 20 MEQ tablet Take 2 tablets (40 mEq total) by mouth daily. (Patient taking differently: Take 40 mEq by mouth daily as needed (Only take lasix).) 14 tablet 3   No facility-administered medications prior to visit.    Allergies  Allergen Reactions  . Nsaids     Aleve,ibuprofen all seems to caused sharp pains in abdominal and discomfort.     Review of Systems  Constitutional: Negative for diaphoresis, fatigue and fever.  HENT: Negative for congestion, rhinorrhea, sinus pain and sore throat.   Eyes: Negative for redness and itching.  Respiratory: Negative for cough, chest tightness and shortness of breath.   Cardiovascular: Positive for leg swelling. Negative for chest pain and palpitations.  Gastrointestinal: Negative for abdominal distention, constipation, diarrhea and vomiting.  Endocrine: Negative for polydipsia and polyuria.  Genitourinary: Negative for dysuria, frequency and urgency.  Musculoskeletal: Positive for arthralgias.  Skin: Positive for color change and rash.  Neurological: Negative for dizziness, syncope, weakness and headaches.  Psychiatric/Behavioral: Negative for agitation, confusion and decreased concentration. The patient is not nervous/anxious.        Objective:    Physical Exam Constitutional:      General: She is not in acute distress.    Appearance: Normal appearance.  HENT:     Head: Normocephalic.  Eyes:     Extraocular Movements: Extraocular movements intact.      Conjunctiva/sclera: Conjunctivae normal.     Pupils: Pupils are equal, round, and reactive to light.  Cardiovascular:     Rate and Rhythm: Normal rate and regular rhythm.     Pulses: Normal pulses.          Dorsalis pedis pulses are 2+ on the right side and 2+ on the left side.     Heart sounds: Normal heart sounds.  Pulmonary:     Effort: Pulmonary effort is normal.     Breath sounds: Normal breath sounds.  Abdominal:     General: Abdomen is flat. There is no distension.  Musculoskeletal:        General: No swelling. Normal  range of motion.     Cervical back: Normal range of motion.     Right lower leg: Edema present.     Left lower leg: Tenderness present. 1+ Pitting Edema present.     Right foot: Normal capillary refill. Deformity present. Normal pulse.     Left foot: Normal capillary refill. Normal pulse.     Comments: Good 2+ pedal pulses.  Dry scaly skin around ankles.  No warmth.  Lateral left ankle tenderness to palpation.   Feet:     Right foot:     Skin integrity: Dry skin present.     Left foot:     Skin integrity: Dry skin present.  Skin:    General: Skin is dry.     Capillary Refill: Capillary refill takes less than 2 seconds.     Findings: No bruising or lesion.  Neurological:     General: No focal deficit present.     Mental Status: She is alert and oriented to person, place, and time. Mental status is at baseline.  Psychiatric:        Mood and Affect: Mood normal.        Behavior: Behavior normal.     BP 134/79   Pulse 92   Ht 5' 9" (1.753 m)   Wt 78.9 kg   LMP 12/11/2012   SpO2 97%   BMI 25.70 kg/m  Wt Readings from Last 3 Encounters:  09/05/20 78.9 kg  07/10/20 84.8 kg  04/24/20 84.8 kg    There are no preventive care reminders to display for this patient.  There are no preventive care reminders to display for this patient.   Lab Results  Component Value Date   TSH 1.77 01/13/2020   Lab Results  Component Value Date   WBC 7.6  01/13/2020   HGB 11.3 (L) 01/13/2020   HCT 34.7 (L) 01/13/2020   MCV 82.2 01/13/2020   PLT 361 01/13/2020   Lab Results  Component Value Date   NA 145 01/24/2020   K 3.9 01/24/2020   CO2 28 01/24/2020   GLUCOSE 131 (H) 01/24/2020   BUN 6 (L) 01/24/2020   CREATININE 0.71 01/24/2020   BILITOT 0.4 01/13/2020   ALKPHOS 113 (H) 09/24/2017   AST 17 01/13/2020   ALT 12 01/13/2020   PROT 6.6 01/13/2020   ALBUMIN 3.4 (L) 09/24/2017   CALCIUM 9.2 01/24/2020   ANIONGAP 10 09/24/2017   Lab Results  Component Value Date   CHOL 183 09/06/2019   Lab Results  Component Value Date   HDL 49 (L) 09/06/2019   Lab Results  Component Value Date   LDLCALC 112 (H) 09/06/2019   Lab Results  Component Value Date   TRIG 116 09/06/2019   Lab Results  Component Value Date   CHOLHDL 3.7 09/06/2019       Assessment & Plan:  Marland KitchenMarland KitchenKimmi was seen today for ankle pain.  Diagnoses and all orders for this visit:  Edema of left ankle -     furosemide (LASIX) injection 20 mg -     furosemide (LASIX) injection 20 mg  Chronic venous stasis -     furosemide (LASIX) injection 20 mg -     furosemide (LASIX) injection 20 mg  Venous stasis dermatitis of both lower extremities -     triamcinolone (KENALOG) 0.1 %; Apply 1 application topically 2 (two) times daily. Combine with Eucerin over dry/scaly areas. -     furosemide (LASIX) injection 20 mg -  furosemide (LASIX) injection 20 mg   No signs of cellulitis. Seems like some exacerbation of chronic venous stasis. Discussed triggers. Good pedal pulses bilaterally. triamcinolone given to combine with eucerin for Leg dermatitis due to venous stasis. Keep legs moisturized. Continue compression stockings. No lasix taken today. IM 71m given today. Continue HCTZ tomorrow.   Patient to follow up if edema worsens, or if red rash or other signs of cellulitis appear. Reassurance given that no signs of infection seen today.     .Marland KitchenVernetta HoneyPA-C,  have reviewed and agree with the above documentation in it's entirety.

## 2020-09-05 NOTE — Patient Instructions (Signed)

## 2020-09-06 ENCOUNTER — Telehealth: Payer: Self-pay | Admitting: Neurology

## 2020-09-06 ENCOUNTER — Ambulatory Visit (INDEPENDENT_AMBULATORY_CARE_PROVIDER_SITE_OTHER): Payer: Medicare HMO

## 2020-09-06 DIAGNOSIS — Z1231 Encounter for screening mammogram for malignant neoplasm of breast: Secondary | ICD-10-CM | POA: Diagnosis not present

## 2020-09-06 MED ORDER — DOXYCYCLINE HYCLATE 100 MG PO TABS: 100.0000 mg | ORAL_TABLET | Freq: Two times a day (BID) | ORAL | 0 refills | Status: DC

## 2020-09-06 NOTE — Telephone Encounter (Signed)
Sent doxycycline to pharmacy

## 2020-09-06 NOTE — Telephone Encounter (Signed)
Patient left vm stating she has stayed off her leg all day and still having swelling from knee to foot, area warm. Please advise. Wants to know if she needs antibiotic?

## 2020-09-06 NOTE — Telephone Encounter (Signed)
Patient made aware.

## 2020-09-07 DIAGNOSIS — N63 Unspecified lump in unspecified breast: Secondary | ICD-10-CM | POA: Insufficient documentation

## 2020-09-08 ENCOUNTER — Other Ambulatory Visit: Payer: Self-pay | Admitting: Physician Assistant

## 2020-09-08 DIAGNOSIS — R928 Other abnormal and inconclusive findings on diagnostic imaging of breast: Secondary | ICD-10-CM

## 2020-09-08 NOTE — Progress Notes (Signed)
Pt should be contacted by breast clinic for more images to further evaluate breast masses detected.

## 2020-09-15 DIAGNOSIS — Z79899 Other long term (current) drug therapy: Secondary | ICD-10-CM | POA: Diagnosis not present

## 2020-09-15 DIAGNOSIS — M5416 Radiculopathy, lumbar region: Secondary | ICD-10-CM | POA: Diagnosis not present

## 2020-09-15 DIAGNOSIS — F17210 Nicotine dependence, cigarettes, uncomplicated: Secondary | ICD-10-CM | POA: Diagnosis not present

## 2020-09-15 DIAGNOSIS — Z79891 Long term (current) use of opiate analgesic: Secondary | ICD-10-CM | POA: Diagnosis not present

## 2020-09-15 DIAGNOSIS — G8929 Other chronic pain: Secondary | ICD-10-CM | POA: Diagnosis not present

## 2020-09-21 DIAGNOSIS — Z961 Presence of intraocular lens: Secondary | ICD-10-CM | POA: Diagnosis not present

## 2020-09-21 DIAGNOSIS — H527 Unspecified disorder of refraction: Secondary | ICD-10-CM | POA: Diagnosis not present

## 2020-09-21 DIAGNOSIS — H26493 Other secondary cataract, bilateral: Secondary | ICD-10-CM | POA: Diagnosis not present

## 2020-09-21 DIAGNOSIS — H02831 Dermatochalasis of right upper eyelid: Secondary | ICD-10-CM | POA: Diagnosis not present

## 2020-09-21 DIAGNOSIS — H35371 Puckering of macula, right eye: Secondary | ICD-10-CM | POA: Diagnosis not present

## 2020-09-21 DIAGNOSIS — H43813 Vitreous degeneration, bilateral: Secondary | ICD-10-CM | POA: Diagnosis not present

## 2020-09-21 DIAGNOSIS — H02834 Dermatochalasis of left upper eyelid: Secondary | ICD-10-CM | POA: Diagnosis not present

## 2020-09-21 LAB — HM DIABETES EYE EXAM

## 2020-09-22 DIAGNOSIS — M5416 Radiculopathy, lumbar region: Secondary | ICD-10-CM | POA: Diagnosis not present

## 2020-09-22 DIAGNOSIS — Z79899 Other long term (current) drug therapy: Secondary | ICD-10-CM | POA: Diagnosis not present

## 2020-09-22 DIAGNOSIS — G8929 Other chronic pain: Secondary | ICD-10-CM | POA: Diagnosis not present

## 2020-09-22 DIAGNOSIS — M5136 Other intervertebral disc degeneration, lumbar region: Secondary | ICD-10-CM | POA: Diagnosis not present

## 2020-09-25 ENCOUNTER — Other Ambulatory Visit: Payer: Self-pay

## 2020-09-25 ENCOUNTER — Ambulatory Visit
Admission: RE | Admit: 2020-09-25 | Discharge: 2020-09-25 | Disposition: A | Discharge status: Home / Self Care | Payer: Medicare HMO | Source: Ambulatory Visit | Attending: Physician Assistant | Admitting: Physician Assistant

## 2020-09-25 ENCOUNTER — Other Ambulatory Visit: Payer: Self-pay | Admitting: Physician Assistant

## 2020-09-25 DIAGNOSIS — R928 Other abnormal and inconclusive findings on diagnostic imaging of breast: Secondary | ICD-10-CM | POA: Diagnosis not present

## 2020-09-25 DIAGNOSIS — N6002 Solitary cyst of left breast: Secondary | ICD-10-CM | POA: Diagnosis not present

## 2020-09-25 DIAGNOSIS — R922 Inconclusive mammogram: Secondary | ICD-10-CM | POA: Diagnosis not present

## 2020-09-28 ENCOUNTER — Encounter: Payer: Self-pay | Admitting: Neurology

## 2020-09-28 DIAGNOSIS — Z79891 Long term (current) use of opiate analgesic: Secondary | ICD-10-CM | POA: Diagnosis not present

## 2020-09-28 DIAGNOSIS — M5136 Other intervertebral disc degeneration, lumbar region: Secondary | ICD-10-CM | POA: Diagnosis not present

## 2020-09-28 DIAGNOSIS — H26499 Other secondary cataract, unspecified eye: Secondary | ICD-10-CM | POA: Insufficient documentation

## 2020-09-28 DIAGNOSIS — H26493 Other secondary cataract, bilateral: Secondary | ICD-10-CM

## 2020-09-28 DIAGNOSIS — F1120 Opioid dependence, uncomplicated: Secondary | ICD-10-CM | POA: Diagnosis not present

## 2020-09-28 DIAGNOSIS — Z79899 Other long term (current) drug therapy: Secondary | ICD-10-CM | POA: Diagnosis not present

## 2020-10-07 ENCOUNTER — Other Ambulatory Visit: Payer: Self-pay | Admitting: Physician Assistant

## 2020-10-07 DIAGNOSIS — E782 Mixed hyperlipidemia: Secondary | ICD-10-CM

## 2020-10-12 DIAGNOSIS — Z124 Encounter for screening for malignant neoplasm of cervix: Secondary | ICD-10-CM | POA: Diagnosis not present

## 2020-10-12 DIAGNOSIS — Z1211 Encounter for screening for malignant neoplasm of colon: Secondary | ICD-10-CM | POA: Diagnosis not present

## 2020-10-12 DIAGNOSIS — Z01419 Encounter for gynecological examination (general) (routine) without abnormal findings: Secondary | ICD-10-CM | POA: Diagnosis not present

## 2020-10-12 DIAGNOSIS — Z1151 Encounter for screening for human papillomavirus (HPV): Secondary | ICD-10-CM | POA: Diagnosis not present

## 2020-10-12 DIAGNOSIS — F17200 Nicotine dependence, unspecified, uncomplicated: Secondary | ICD-10-CM | POA: Diagnosis not present

## 2020-10-25 ENCOUNTER — Other Ambulatory Visit: Payer: Self-pay | Admitting: Physician Assistant

## 2020-10-25 DIAGNOSIS — I10 Essential (primary) hypertension: Secondary | ICD-10-CM

## 2020-10-26 DIAGNOSIS — F1120 Opioid dependence, uncomplicated: Secondary | ICD-10-CM | POA: Diagnosis not present

## 2020-10-26 DIAGNOSIS — M5136 Other intervertebral disc degeneration, lumbar region: Secondary | ICD-10-CM | POA: Diagnosis not present

## 2020-10-26 DIAGNOSIS — Z79891 Long term (current) use of opiate analgesic: Secondary | ICD-10-CM | POA: Diagnosis not present

## 2020-10-26 DIAGNOSIS — Z6828 Body mass index (BMI) 28.0-28.9, adult: Secondary | ICD-10-CM | POA: Diagnosis not present

## 2020-10-26 DIAGNOSIS — Z79899 Other long term (current) drug therapy: Secondary | ICD-10-CM | POA: Diagnosis not present

## 2020-10-31 ENCOUNTER — Encounter: Payer: Self-pay | Admitting: Physician Assistant

## 2020-10-31 DIAGNOSIS — H26492 Other secondary cataract, left eye: Secondary | ICD-10-CM | POA: Diagnosis not present

## 2020-11-01 DIAGNOSIS — F1120 Opioid dependence, uncomplicated: Secondary | ICD-10-CM | POA: Diagnosis not present

## 2020-11-01 DIAGNOSIS — Z79891 Long term (current) use of opiate analgesic: Secondary | ICD-10-CM | POA: Diagnosis not present

## 2020-11-01 DIAGNOSIS — M5416 Radiculopathy, lumbar region: Secondary | ICD-10-CM | POA: Diagnosis not present

## 2020-11-01 DIAGNOSIS — G8929 Other chronic pain: Secondary | ICD-10-CM | POA: Diagnosis not present

## 2020-11-08 DIAGNOSIS — M5136 Other intervertebral disc degeneration, lumbar region: Secondary | ICD-10-CM | POA: Diagnosis not present

## 2020-11-08 DIAGNOSIS — G8929 Other chronic pain: Secondary | ICD-10-CM | POA: Diagnosis not present

## 2020-11-08 DIAGNOSIS — M5416 Radiculopathy, lumbar region: Secondary | ICD-10-CM | POA: Diagnosis not present

## 2020-11-08 DIAGNOSIS — Z79899 Other long term (current) drug therapy: Secondary | ICD-10-CM | POA: Diagnosis not present

## 2020-11-11 ENCOUNTER — Other Ambulatory Visit: Payer: Self-pay | Admitting: Physician Assistant

## 2020-11-11 DIAGNOSIS — E782 Mixed hyperlipidemia: Secondary | ICD-10-CM

## 2020-11-12 ENCOUNTER — Other Ambulatory Visit: Payer: Self-pay | Admitting: Physician Assistant

## 2020-11-12 DIAGNOSIS — I10 Essential (primary) hypertension: Secondary | ICD-10-CM

## 2020-11-20 ENCOUNTER — Other Ambulatory Visit: Payer: Self-pay | Admitting: Physician Assistant

## 2020-11-20 DIAGNOSIS — E782 Mixed hyperlipidemia: Secondary | ICD-10-CM

## 2020-11-28 ENCOUNTER — Other Ambulatory Visit: Payer: Self-pay | Admitting: Physician Assistant

## 2020-11-28 DIAGNOSIS — E782 Mixed hyperlipidemia: Secondary | ICD-10-CM

## 2020-12-01 ENCOUNTER — Other Ambulatory Visit: Payer: Self-pay | Admitting: Physician Assistant

## 2020-12-01 DIAGNOSIS — E782 Mixed hyperlipidemia: Secondary | ICD-10-CM

## 2020-12-06 ENCOUNTER — Telehealth: Payer: Self-pay | Admitting: Neurology

## 2020-12-06 NOTE — Telephone Encounter (Signed)
Patient called about Atorvastatin refill. Aware order for lipid panel at the lab. She will have this done and we will send meds after we received results. She expressed understanding.

## 2020-12-09 LAB — LIPID PANEL W/REFLEX DIRECT LDL
Cholesterol: 120 mg/dL (ref <200)
HDL: 38 mg/dL — ABNORMAL LOW (ref >=50)
LDL Cholesterol (Calc): 64 mg/dL (calc)
Non-HDL Cholesterol (Calc): 82 mg/dL (calc) (ref <130)
Total CHOL/HDL Ratio: 3.2 (calc) (ref <5.0)
Triglycerides: 92 mg/dL (ref <150)

## 2020-12-11 NOTE — Telephone Encounter (Signed)
Kate,  LDL, bad cholesterol, improved.  HDL , good cholesterol, worsened a bit. Smoking cessation and exercise can help HDL the most.

## 2020-12-12 ENCOUNTER — Other Ambulatory Visit: Payer: Self-pay | Admitting: Neurology

## 2020-12-12 DIAGNOSIS — E782 Mixed hyperlipidemia: Secondary | ICD-10-CM

## 2020-12-12 MED ORDER — ATORVASTATIN CALCIUM 40 MG PO TABS: 1.0000 | ORAL_TABLET | Freq: Every day | ORAL | 3 refills | Status: DC

## 2021-01-16 ENCOUNTER — Encounter: Payer: Self-pay | Admitting: Neurology

## 2021-01-16 LAB — HM COLONOSCOPY

## 2021-03-30 ENCOUNTER — Ambulatory Visit (INDEPENDENT_AMBULATORY_CARE_PROVIDER_SITE_OTHER): Payer: Medicare HMO | Admitting: Physician Assistant

## 2021-03-30 ENCOUNTER — Encounter: Payer: Self-pay | Admitting: Physician Assistant

## 2021-03-30 ENCOUNTER — Other Ambulatory Visit: Payer: Self-pay

## 2021-03-30 VITALS — BP 143/87 | HR 75 | Ht 69.0 in | Wt 171.4 lb

## 2021-03-30 DIAGNOSIS — I878 Other specified disorders of veins: Secondary | ICD-10-CM

## 2021-03-30 DIAGNOSIS — M79605 Pain in left leg: Secondary | ICD-10-CM

## 2021-03-30 DIAGNOSIS — Z23 Encounter for immunization: Secondary | ICD-10-CM

## 2021-03-30 DIAGNOSIS — M7989 Other specified soft tissue disorders: Secondary | ICD-10-CM | POA: Diagnosis not present

## 2021-03-30 DIAGNOSIS — F17200 Nicotine dependence, unspecified, uncomplicated: Secondary | ICD-10-CM

## 2021-03-30 MED ORDER — DOXYCYCLINE HYCLATE 100 MG PO TABS: 100.0000 mg | ORAL_TABLET | Freq: Two times a day (BID) | ORAL | 0 refills | Status: AC

## 2021-03-30 NOTE — Patient Instructions (Signed)
Chronic Venous Insufficiency Chronic venous insufficiency is a condition where the leg veins cannot effectively pump blood from the legs to the heart. This happens when the vein walls are either stretched, weakened, or damaged, or when the valves inside the vein are damaged. With the right treatment, you should be able to continue withan active life. This condition is also called venous stasis. What are the causes? Common causes of this condition include: High blood pressure inside the veins (venous hypertension). Sitting or standing too long, causing increased blood pressure in the leg veins. A blood clot that blocks blood flow in a vein (deep vein thrombosis, DVT). Inflammation of a vein (phlebitis) that causes a blood clot to form. Tumors in the pelvis that cause blood to back up. What increases the risk? The following factors may make you more likely to develop this condition: Having a family history of this condition. Obesity. Pregnancy. Living without enough regular physical activity or exercise (sedentary lifestyle). Smoking. Having a job that requires long periods of standing or sitting in one place. Being a certain age. Women in their 72s and 40s and men in their 66s are more likely to develop this condition. What are the signs or symptoms? Symptoms of this condition include: Veins that are enlarged, bulging, or twisted (varicose veins). Skin breakdown or ulcers. Reddened skin or dark discoloration of skin on the leg between the knee and ankle. Brown, smooth, tight, and painful skin just above the ankle, usually on the inside of the leg (lipodermatosclerosis). Swelling of the legs. How is this diagnosed? This condition may be diagnosed based on: Your medical history. A physical exam. Tests, such as: A procedure that creates an image of a blood vessel and nearby organs and provides information about blood flow through the blood vessel (duplex ultrasound). A procedure that tests  blood flow (plethysmography). A procedure that looks at the veins using X-ray and dye (venogram). How is this treated? The goals of treatment are to help you return to an active life and to minimize pain or disability. Treatment depends on the severity of your condition, and it may include: Wearing compression stockings. These can help relieve symptoms and help prevent your condition from getting worse. However, they do not cure the condition. Sclerotherapy. This procedure involves an injection of a solution that shrinks damaged veins. Surgery. This may involve: Removing a diseased vein (vein stripping). Cutting off blood flow through the vein (laser ablation surgery). Repairing or reconstructing a valve within the affected vein. Follow these instructions at home:     Wear compression stockings as told by your health care provider. These stockings help to prevent blood clots and reduce swelling in your legs. Take over-the-counter and prescription medicines only as told by your health care provider. Stay active by exercising, walking, or doing different activities. Ask your health care provider what activities are safe for you and how much exercise you need. Drink enough fluid to keep your urine pale yellow. Do not use any products that contain nicotine or tobacco, such as cigarettes, e-cigarettes, and chewing tobacco. If you need help quitting, ask your health care provider. Keep all follow-up visits as told by your health care provider. This is important. Contact a health care provider if you: Have redness, swelling, or more pain in the affected area. See a red streak or line that goes up or down from the affected area. Have skin breakdown or skin loss in the affected area, even if the breakdown is small. Get an  injury in the affected area. Get help right away if: You get an injury and an open wound in the affected area. You have: Severe pain that does not get better with  medicine. Sudden numbness or weakness in the foot or ankle below the affected area. Trouble moving your foot or ankle. A fever. Worse or persistent symptoms. Chest pain. Shortness of breath. Summary Chronic venous insufficiency is a condition where the leg veins cannot effectively pump blood from the legs to the heart. Chronic venous insufficiency occurs when the vein walls become stretched, weakened, or damaged, or when valves within the vein are damaged. Treatment depends on how severe your condition is. It often involves wearing compression stockings and may involve having a procedure. Make sure you stay active by exercising, walking, or doing different activities. Ask your health care provider what activities are safe for you and how much exercise you need. This information is not intended to replace advice given to you by your health care provider. Make sure you discuss any questions you have with your healthcare provider. Document Revised: 04/14/2018 Document Reviewed: 04/14/2018 Elsevier Patient Education  [place].

## 2021-03-30 NOTE — Progress Notes (Signed)
Subjective:    Patient ID: Sophia Smith, female    DOB: 1961/06/06, 60 y.o.   MRN: [id number]  HPI Pt is a 60 yo female with hx of COPD, chronic venous stasis and cellulitis who presents to the clinic with left leg swelling for last 4 days. She denies any injury or recent travel. She concerned about infection. She started doxy from her brother that was left over but ran out yesterday. She is a current heavy smoker. She does complain of leg cramps/pain.she uses lasix as needed for swelling which does help. Leg does seem to be improving with doxy.   .. Active Ambulatory Problems    Diagnosis Date Noted   Lymphedema of leg 09/13/2013   Pneumothorax on left 09/13/2013   Left rib fracture 09/13/2013   Fibromyalgia 09/13/2013   Lumbar disc disease 09/13/2013   Aneurysm artery, neck ([place]) 09/15/2013   Pelvic fracture ([facility]) 09/15/2013   Depression 09/15/2013   Elevated alkaline phosphatase level 10/15/2013   Bilateral lower extremity edema 10/26/2013   Left knee pain 10/26/2013   Tricompartment degenerative joint disease of knee 10/26/2013   Plantar fasciitis, bilateral 03/21/2014   S/P total knee replacement 09/13/2014   Left ankle pain 09/13/2014   Insomnia secondary to chronic pain 06/01/2015   Upper back pain on right side 06/01/2015   Internal hemorrhoid 12/05/2015   Iron deficiency anemia 05/13/2017   Drug-seeking behavior 09/19/2017   Essential hypertension 09/19/2017   Opioid dependence with withdrawal ([place]) 09/19/2017   Chronic bilateral low back pain with bilateral sciatica 09/19/2017   Cigarette nicotine dependence without complication [id number]   Lumbar spondylolysis 09/23/2017   Mitral regurgitation 10/22/2017   Tricuspid regurgitation 10/22/2017   Left atrial enlargement 10/22/2017   Bilateral cataracts 11/04/2017   Disorder of refraction 11/04/2017   Thumb pain, left 03/22/2018   Extensor carpi radialis brevis tenosynovitis 03/22/2018   Non-intractable vomiting  with nausea 03/22/2018   Hiatal hernia 03/22/2018   Chronic venous stasis 05/14/2018   Plantar callus 08/13/2018   Vertebral artery dissection ([place]) 09/01/2018   Tobacco abuse 09/01/2018   Dyslipidemia (high LDL; low HDL) 03/03/2019   Normocytic anemia 01/14/2020   Hypokalemia 01/14/2020   Aortic atherosclerosis ([place]) 01/24/2020   Rib pain on left side 01/24/2020   Panlobular emphysema ([place]) 01/25/2020   Pulmonary nodules 01/25/2020   Status post left knee replacement 03/22/2020   Fall 03/27/2020   Current every day smoker 03/27/2020   Bunion of great toe of right foot 04/18/2020   Centrilobular emphysema ([place]) 07/31/2020   Venous stasis dermatitis of both lower extremities 09/05/2020   Edema of left ankle 09/05/2020   PCO (posterior capsular opacification) 09/28/2020   Resolved Ambulatory Problems    Diagnosis Date Noted   Acute stress reaction 06/01/2015   Seroma, post-traumatic ([place]) 05/13/2017   Cellulitis of left lower leg 08/04/2017   Wound of left leg 08/04/2017   Past Medical History:  Diagnosis Date   Back disorder    Knee gives out    Other disorders of continuity of bone, right pelvic region and thigh    Stroke [facility])      Review of Systems See HPI.     Objective:   Physical Exam Vitals reviewed.  Constitutional:      Appearance: Normal appearance.  HENT:     Head: Normocephalic.  Cardiovascular:     Rate and Rhythm: Normal rate and regular rhythm.     Pulses: Normal pulses.     Heart sounds:  Normal heart sounds.  Musculoskeletal:     Right lower leg: No edema.     Left lower leg: Edema present.     Comments: Hyperpigmentation over both bilateral lower legs.  Left ankle into mid calf 1+edema.  No ulcers.  Tenderness and warm to palpation over left leg.   Neurological:     General: No focal deficit present.     Mental Status: She is alert and oriented to person, place, and time.  Psychiatric:        Mood and Affect: Mood normal.           Assessment & Plan:  Marland KitchenMarland KitchenMakilah was seen today for cellulitis.  Diagnoses and all orders for this visit:  Left leg pain -     doxycycline (VIBRA-TABS) 100 MG tablet; Take 1 tablet (100 mg total) by mouth 2 (two) times daily for 7 days. -     VAS [place] ABI WITH/WO TBI; Future  Needs flu shot -     Flu Vaccine QUAD 41moIM (Fluarix, Fluzone & Alfiuria Quad PF)  Chronic venous stasis -     VAS [place]I WITH/WO TBI; Future  Left leg swelling -     doxycycline (VIBRA-TABS) 100 MG tablet; Take 1 tablet (100 mg total) by mouth 2 (two) times daily for 7 days. -     VAS [place]I WITH/WO TBI; Future  Current smoker -     VAS [place] WITH/WO TBI; Future   No cramping or increased pain with walking. She is a smoker but no PE signs of claudication or need for ABI. If she is going to be using compression stockings I still would like to get to make sure not worsening any other problems.   I suspect more venous stasis causing swelling and left leg pain but she has been on doxy for 4 days. Will continue doxy to finish the dosing to completely treat an residual cellulitis due to her history of sepsis from cellulitis. Compression, elevation, watch salt intake, diuretic for swelling control.

## 2021-04-02 ENCOUNTER — Inpatient Hospital Stay: Admission: RE | Admit: 2021-04-02 | Payer: Medicare HMO | Source: Ambulatory Visit

## 2021-04-13 ENCOUNTER — Other Ambulatory Visit: Payer: Self-pay | Admitting: Orthopaedic Surgery

## 2021-04-13 DIAGNOSIS — M810 Age-related osteoporosis without current pathological fracture: Secondary | ICD-10-CM

## 2021-04-18 ENCOUNTER — Other Ambulatory Visit: Payer: Medicare HMO

## 2021-04-27 ENCOUNTER — Other Ambulatory Visit: Payer: Self-pay | Admitting: Physician Assistant

## 2021-04-27 DIAGNOSIS — I10 Essential (primary) hypertension: Secondary | ICD-10-CM

## 2021-05-09 ENCOUNTER — Other Ambulatory Visit: Payer: Self-pay | Admitting: Physician Assistant

## 2021-05-09 ENCOUNTER — Ambulatory Visit
Admission: RE | Admit: 2021-05-09 | Discharge: 2021-05-09 | Disposition: A | Discharge status: Home / Self Care | Payer: Medicare HMO | Source: Ambulatory Visit | Attending: Physician Assistant | Admitting: Physician Assistant

## 2021-05-09 ENCOUNTER — Other Ambulatory Visit: Payer: Self-pay

## 2021-05-09 DIAGNOSIS — N6001 Solitary cyst of right breast: Secondary | ICD-10-CM

## 2021-05-09 DIAGNOSIS — R928 Other abnormal and inconclusive findings on diagnostic imaging of breast: Secondary | ICD-10-CM

## 2021-05-10 NOTE — Progress Notes (Signed)
Follow right breast cyst in 6 months.

## 2021-05-15 ENCOUNTER — Other Ambulatory Visit: Payer: Self-pay

## 2021-05-15 ENCOUNTER — Ambulatory Visit: Payer: Medicare HMO | Admitting: Orthopedic Surgery

## 2021-05-15 DIAGNOSIS — M205X1 Other deformities of toe(s) (acquired), right foot: Secondary | ICD-10-CM | POA: Diagnosis not present

## 2021-05-15 DIAGNOSIS — M2011 Hallux valgus (acquired), right foot: Secondary | ICD-10-CM | POA: Diagnosis not present

## 2021-05-15 DIAGNOSIS — M2021 Hallux rigidus, right foot: Secondary | ICD-10-CM

## 2021-05-30 ENCOUNTER — Encounter: Payer: Self-pay | Admitting: Orthopedic Surgery

## 2021-05-30 NOTE — Progress Notes (Signed)
Office Visit Note   Patient: Sophia Smith           Date of Birth: 1961/01/14           MRN: [id number] Visit Date: 05/15/2021              Requested by: Donella Stade, PA-C [address] [place],  [place] [address] PCP: Donella Stade, PA-C  No chief complaint on file.     HPI: Patient is a 60 year old woman who presents with right foot pain at the MTP joint as well as from the clawing of her toes.  Patient states she is unable to ambulate barefoot.  Patient states she was considering back surgery but this is on hold she is currently in a pain clinic.  She states she is trying to decrease her smoking.  Assessment & Plan: Visit Diagnoses:  1. Hallux rigidus, right foot   2. Hallux valgus (acquired), right foot   3. Claw toe, acquired, right     Plan: Discussed that surgical intervention is an option this would include outpatient surgery for gastrocnemius recession fusion of the great toe MTP joint Weil osteotomy for the second metatarsal head as well as PIP resections for the second and third toes risks and benefits were discussed including risks of the wound not healing secondary to smoking risk of persistent pain need for additional surgery.  Patient states she understands and would like to proceed with surgery.  Follow-Up Instructions: Return in about 2 weeks (around 05/29/2021).   Ortho Exam  Patient is alert, oriented, no adenopathy, well-dressed, normal affect, normal respiratory effort. Examination patient has palpable pulses.  She has hallux valgus and hallux rigidus of the right great toe.  She has fixed clawing of the second and third toe PIP joints.  Radiograph shows a long second third and fourth metatarsal.  With her knee extended she has dorsiflexion only to neutral with contracture of the Achilles.  She does have ulceration on the second toe secondary to pressure from the great toe.  She does have venous swelling.  There is callus beneath the  second metatarsal head this was debrided with a 10 blade knife after informed consent and the area of debridement was 1 cm in diameter.  Imaging: No results found. No images are attached to the encounter.  Labs: Lab Results  Component Value Date   LABORGA NO GROWTH 12/01/2013     Lab Results  Component Value Date   ALBUMIN 3.4 (L) 09/24/2017   ALBUMIN 3.8 04/20/2014   ALBUMIN 3.6 10/25/2013    No results found for: MG Lab Results  Component Value Date   VD25OH 33 10/14/2013    No results found for: PREALBUMIN CBC EXTENDED Latest Ref Rng & Units 01/13/2020 08/31/2018 11/06/2017  WBC 3.8 - 10.8 Thousand/uL 7.6 6.1 6.0  RBC 3.80 - 5.10 Million/uL 4.22 4.40 5.57(H)  HGB 11.7 - 15.5 g/dL 11.3(L) 13.1 14.9  HCT 35.0 - 45.0 % 34.7(L) 38.6 46.3  PLT 140 - 400 Thousand/uL 361 266 275  NEUTROABS 1,500 - 7,800 cells/uL - 3,007 3.6  LYMPHSABS 850 - 3,900 cells/uL - 2,208 1.8     There is no height or weight on file to calculate BMI.  Orders:  No orders of the defined types were placed in this encounter.  No orders of the defined types were placed in this encounter.    Procedures: No procedures performed  Clinical Data: No additional findings.  ROS:  All other systems negative, except as noted in the HPI. Review of Systems  Objective: Vital Signs: LMP 12/11/2012   Specialty Comments:  No specialty comments available.  PMFS History: Patient Active Problem List   Diagnosis Date Noted   PCO (posterior capsular opacification) 09/28/2020   Venous stasis dermatitis of both lower extremities 09/05/2020   Edema of left ankle 09/05/2020   Centrilobular emphysema ([place]) 07/31/2020   Bunion of great toe of right foot 04/18/2020   Fall 03/27/2020   Current every day smoker 03/27/2020   Status post left knee replacement 03/22/2020   Panlobular emphysema ([place]) 01/25/2020   Pulmonary nodules 01/25/2020   Aortic atherosclerosis ([place]) 01/24/2020   Rib pain on left side  01/24/2020   Normocytic anemia 01/14/2020   Hypokalemia 01/14/2020   Dyslipidemia (high LDL; low HDL) 03/03/2019   Vertebral artery dissection (HCC) 09/01/2018   Tobacco abuse 09/01/2018   Plantar callus 08/13/2018   Chronic venous stasis 05/14/2018   Thumb pain, left 03/22/2018   Extensor carpi radialis brevis tenosynovitis 03/22/2018   Non-intractable vomiting with nausea 03/22/2018   Hiatal hernia 03/22/2018   Bilateral cataracts 11/04/2017   Disorder of refraction 11/04/2017   Mitral regurgitation 10/22/2017   Tricuspid regurgitation 10/22/2017   Left atrial enlargement 10/22/2017   Lumbar spondylolysis 09/23/2017   Drug-seeking behavior 09/19/2017   Essential hypertension 09/19/2017   Opioid dependence with withdrawal ([place]) 09/19/2017   Chronic bilateral low back pain with bilateral sciatica 09/19/2017   Cigarette nicotine dependence without complication 39/10/90   Iron deficiency anemia 05/13/2017   Internal hemorrhoid 12/05/2015   Insomnia secondary to chronic pain 06/01/2015   Upper back pain on right side 06/01/2015   S/P total knee replacement 09/13/2014   Left ankle pain 09/13/2014   Plantar fasciitis, bilateral 03/21/2014   Bilateral lower extremity edema 10/26/2013   Left knee pain 10/26/2013   Tricompartment degenerative joint disease of knee 10/26/2013   Elevated alkaline phosphatase level 10/15/2013   Aneurysm artery, neck ([place]) 09/15/2013   Pelvic fracture ([place]) 09/15/2013   Depression 09/15/2013   Lymphedema of leg 09/13/2013   Pneumothorax on left 09/13/2013   Left rib fracture 09/13/2013   Fibromyalgia 09/13/2013   Lumbar disc disease 09/13/2013   Past Medical History:  Diagnosis Date   Back disorder    Cellulitis of left lower leg 08/04/2017   Fibromyalgia    Knee gives out    Other disorders of continuity of bone, right pelvic region and thigh    Stroke [facility])     Family History  Problem Relation Age of Onset   Heart attack Father     Cancer Father    Breast cancer Maternal Grandmother     Past Surgical History:  Procedure Laterality Date   CHOLECYSTECTOMY     LEG SURGERY Bilateral    numerous leg surgeries post accident age 73   SIGMOID RESECTION / RECTOPEXY     Social History   Occupational History   Occupation: Marine scientist    Comment: retired  Tobacco Use   Smoking status: Every Day    Packs/day: 0.75    Years: 40.00    Pack years: 30.00    Types: Cigarettes    Last attempt to quit: 08/23/2013    Years since quitting: 7.7   Smokeless tobacco: Never  Vaping Use   Vaping Use: Never used  Substance and Sexual Activity   Alcohol use: No   Drug use: No   Sexual activity: Not Currently

## 2021-06-25 ENCOUNTER — Ambulatory Visit (HOSPITAL_BASED_OUTPATIENT_CLINIC_OR_DEPARTMENT_OTHER)
Admission: RE | Admit: 2021-06-25 | Discharge: 2021-06-25 | Disposition: A | Discharge status: Home / Self Care | Payer: Medicare HMO | Source: Ambulatory Visit | Attending: Physician Assistant | Admitting: Physician Assistant

## 2021-06-25 ENCOUNTER — Other Ambulatory Visit: Payer: Self-pay

## 2021-06-25 DIAGNOSIS — M7989 Other specified soft tissue disorders: Secondary | ICD-10-CM | POA: Diagnosis not present

## 2021-06-25 DIAGNOSIS — F17200 Nicotine dependence, unspecified, uncomplicated: Secondary | ICD-10-CM | POA: Insufficient documentation

## 2021-06-25 DIAGNOSIS — M79605 Pain in left leg: Secondary | ICD-10-CM | POA: Insufficient documentation

## 2021-06-25 DIAGNOSIS — I878 Other specified disorders of veins: Secondary | ICD-10-CM | POA: Diagnosis not present

## 2021-06-25 NOTE — Progress Notes (Signed)
No PVD. Normal ABI.

## 2021-06-26 ENCOUNTER — Telehealth: Payer: Self-pay | Admitting: Physician Assistant

## 2021-06-26 ENCOUNTER — Other Ambulatory Visit: Payer: Self-pay

## 2021-06-26 NOTE — Telephone Encounter (Signed)
Patient left a voicemail that she missed a call from someone yesterday about receiving results, stated she has a procedure today she is having done. AM

## 2021-06-26 NOTE — Telephone Encounter (Signed)
Patient informed of ABI results.  Charyl Bigger, CMA

## 2021-07-10 ENCOUNTER — Encounter: Payer: Self-pay | Admitting: Physician Assistant

## 2021-07-10 ENCOUNTER — Other Ambulatory Visit: Payer: Self-pay

## 2021-07-10 ENCOUNTER — Ambulatory Visit (INDEPENDENT_AMBULATORY_CARE_PROVIDER_SITE_OTHER): Payer: Medicare HMO | Admitting: Physician Assistant

## 2021-07-10 VITALS — BP 136/99 | HR 102 | Temp 98.2°F | Ht 69.0 in | Wt 158.0 lb

## 2021-07-10 DIAGNOSIS — J329 Chronic sinusitis, unspecified: Secondary | ICD-10-CM | POA: Diagnosis not present

## 2021-07-10 DIAGNOSIS — J441 Chronic obstructive pulmonary disease with (acute) exacerbation: Secondary | ICD-10-CM | POA: Diagnosis not present

## 2021-07-10 DIAGNOSIS — J40 Bronchitis, not specified as acute or chronic: Secondary | ICD-10-CM | POA: Diagnosis not present

## 2021-07-10 MED ORDER — PROMETHAZINE-DM 6.25-15 MG/5ML PO SYRP: 5.0000 mL | ORAL_SOLUTION | Freq: Four times a day (QID) | ORAL | 0 refills | Status: DC | PRN

## 2021-07-10 MED ORDER — DOXYCYCLINE HYCLATE 100 MG PO TABS: 100.0000 mg | ORAL_TABLET | Freq: Two times a day (BID) | ORAL | 0 refills | Status: DC

## 2021-07-10 MED ORDER — PREDNISONE 20 MG PO TABS
20.0000 mg | ORAL_TABLET | Freq: Two times a day (BID) | ORAL | 0 refills | Status: DC
Start: 2021-07-10 — End: 2021-08-03

## 2021-07-10 NOTE — Progress Notes (Signed)
Subjective:    Patient ID: Sophia Smith, female    DOB: 04-10-1961, 60 y.o.   MRN: [id number]  HPI Pt is a 60 yo female with COPD and current smoker who presents to the clinic with sinus pressure, congestion, cough, SOB for 5 days. She has been taking mucinex, sudafed, delsym with no relief. No body aches, fever, chills. Tested negative for covid.   .. Active Ambulatory Problems    Diagnosis Date Noted   Lymphedema of leg 09/13/2013   Pneumothorax on left 09/13/2013   Left rib fracture 09/13/2013   Fibromyalgia 09/13/2013   Lumbar disc disease 09/13/2013   Aneurysm artery, neck ([place]) 09/15/2013   Pelvic fracture (HCC) 09/15/2013   Depression 09/15/2013   Elevated alkaline phosphatase level 10/15/2013   Bilateral lower extremity edema 10/26/2013   Left knee pain 10/26/2013   Tricompartment degenerative joint disease of knee 10/26/2013   Plantar fasciitis, bilateral 03/21/2014   S/P total knee replacement 09/13/2014   Left ankle pain 09/13/2014   Insomnia secondary to chronic pain 06/01/2015   Upper back pain on right side 06/01/2015   Internal hemorrhoid 12/05/2015   Iron deficiency anemia 05/13/2017   Drug-seeking behavior 09/19/2017   Essential hypertension 09/19/2017   Opioid dependence with withdrawal ([place]) 09/19/2017   Chronic bilateral low back pain with bilateral sciatica 09/19/2017   Cigarette nicotine dependence without complication 62/22/9798   Lumbar spondylolysis 09/23/2017   Mitral regurgitation 10/22/2017   Tricuspid regurgitation 10/22/2017   Left atrial enlargement 10/22/2017   Bilateral cataracts 11/04/2017   Disorder of refraction 11/04/2017   Thumb pain, left 03/22/2018   Extensor carpi radialis brevis tenosynovitis 03/22/2018   Non-intractable vomiting with nausea 03/22/2018   Hiatal hernia 03/22/2018   Chronic venous stasis 05/14/2018   Plantar callus 08/13/2018   Vertebral artery dissection ([place]) 09/01/2018   Tobacco abuse 09/01/2018    Dyslipidemia (high LDL; low HDL) 03/03/2019   Normocytic anemia 01/14/2020   Hypokalemia 01/14/2020   Aortic atherosclerosis ([place]) 01/24/2020   Rib pain on left side 01/24/2020   Panlobular emphysema ([place]) 01/25/2020   Pulmonary nodules 01/25/2020   Status post left knee replacement 03/22/2020   Fall 03/27/2020   Current every day smoker 03/27/2020   Bunion of great toe of right foot 04/18/2020   Centrilobular emphysema ([place]) 07/31/2020   Venous stasis dermatitis of both lower extremities 09/05/2020   Edema of left ankle 09/05/2020   PCO (posterior capsular opacification) 09/28/2020   Resolved Ambulatory Problems    Diagnosis Date Noted   Acute stress reaction 06/01/2015   Seroma, post-traumatic ([place]) 05/13/2017   Cellulitis of left lower leg 08/04/2017   Wound of left leg 08/04/2017   Past Medical History:  Diagnosis Date   Back disorder    Knee gives out    Other disorders of continuity of bone, right pelvic region and thigh    Stroke [facility])      Review of Systems See HPI.     Objective:   Physical Exam Vitals reviewed.  Constitutional:      Appearance: Normal appearance.  HENT:     Head: Normocephalic.     Right Ear: Tympanic membrane, ear canal and external ear normal. There is no impacted cerumen.     Left Ear: Tympanic membrane, ear canal and external ear normal. There is no impacted cerumen.     Nose: Congestion present.     Mouth/Throat:     Mouth: Mucous membranes are moist.     Pharynx: Posterior  oropharyngeal erythema present.  Eyes:     Conjunctiva/sclera: Conjunctivae normal.  Cardiovascular:     Rate and Rhythm: Normal rate and regular rhythm.  Pulmonary:     Effort: Pulmonary effort is normal.     Comments: Coarse breath sounds with scattered rhonchi Neurological:     General: No focal deficit present.     Mental Status: She is alert and oriented to person, place, and time.  Psychiatric:        Mood and Affect: Mood normal.           Assessment & Plan:  Marland KitchenMarland KitchenJacolyn was seen today for cough.  Diagnoses and all orders for this visit:  Sinobronchitis -     doxycycline (VIBRA-TABS) 100 MG tablet; Take 1 tablet (100 mg total) by mouth 2 (two) times daily. -     predniSONE (DELTASONE) 20 MG tablet; Take 1 tablet (20 mg total) by mouth 2 (two) times daily with a meal. -     promethazine-dextromethorphan (PROMETHAZINE-DM) 6.25-15 MG/5ML syrup; Take 5 mLs by mouth 4 (four) times daily as needed for cough.  COPD exacerbation (HCC) -     doxycycline (VIBRA-TABS) 100 MG tablet; Take 1 tablet (100 mg total) by mouth 2 (two) times daily. -     predniSONE (DELTASONE) 20 MG tablet; Take 1 tablet (20 mg total) by mouth 2 (two) times daily with a meal. -     promethazine-dextromethorphan (PROMETHAZINE-DM) 6.25-15 MG/5ML syrup; Take 5 mLs by mouth 4 (four) times daily as needed for cough.  Negative for covid.  5 days of symptoms worsening.  Start doxycycline/prednisone/cough syrup with as needed albuterol inhaler.  Follow up as needed or if symptoms worsen.

## 2021-07-10 NOTE — Patient Instructions (Signed)

## 2021-08-01 NOTE — Progress Notes (Signed)
LVM with pre-op instructions to arrive at 0530 on 08/03/21.

## 2021-08-02 ENCOUNTER — Encounter (HOSPITAL_COMMUNITY): Payer: Self-pay | Admitting: Orthopedic Surgery

## 2021-08-02 ENCOUNTER — Other Ambulatory Visit: Payer: Self-pay | Admitting: Acute Care

## 2021-08-02 ENCOUNTER — Other Ambulatory Visit: Payer: Self-pay

## 2021-08-02 DIAGNOSIS — F17210 Nicotine dependence, cigarettes, uncomplicated: Secondary | ICD-10-CM

## 2021-08-02 NOTE — Progress Notes (Signed)
Sophia Smith denies chest pain or shortness of breath. Patient denies having any s/s of Covid in her household.  Patient denies any known exposure to Covid.   Sophia Smith has a heart murmer, she had an ECHO in 2021, it showed a small mitral prolapse.  PCP is Edgardo Roys , [place]- C.

## 2021-08-02 NOTE — Anesthesia Preprocedure Evaluation (Addendum)
Anesthesia Evaluation  Patient identified by MRN, date of birth, ID band Patient awake    Reviewed: Allergy & Precautions, NPO status , Patient's Chart, lab work & pertinent test results  History of Anesthesia Complications Negative for: history of anesthetic complications  Airway Mallampati: III  TM Distance: >3 FB Neck ROM: Full    Dental  (+) Edentulous Upper, Edentulous Lower   Pulmonary COPD,  COPD inhaler, Current SmokerPatient did not abstain from smoking.,    Pulmonary exam normal        Cardiovascular hypertension, Pt. on medications Normal cardiovascular exam+ Valvular Problems/Murmurs MR    '21 TTE - EF 60 to 65%. Grade I diastolic dysfunction (impaired relaxation). Mild to moderate MR. There is mild to moderate dilatation of the ascending aorta measuring 40 mm.     Neuro/Psych PSYCHIATRIC DISORDERS Depression TIA   GI/Hepatic Neg liver ROS, hiatal hernia, GERD  Medicated and Controlled,  Endo/Other  negative endocrine ROS  Renal/GU negative Renal ROS     Musculoskeletal  (+) Arthritis , Fibromyalgia -, narcotic dependent  Abdominal   Peds  Hematology negative hematology ROS (+)   Anesthesia Other Findings   Reproductive/Obstetrics                            Anesthesia Physical Anesthesia Plan  ASA: 3  Anesthesia Plan: General   Post-op Pain Management: Regional block and Tylenol PO (pre-op)   Induction: Intravenous  PONV Risk Score and Plan: 3 and Treatment may vary due to age or medical condition, Ondansetron, Dexamethasone and Midazolam  Airway Management Planned: LMA  Additional Equipment: None  Intra-op Plan:   Post-operative Plan: Extubation in OR  Informed Consent: I have reviewed the patients History and Physical, chart, labs and discussed the procedure including the risks, benefits and alternatives for the proposed anesthesia with the patient or authorized  representative who has indicated his/her understanding and acceptance.     Dental advisory given  Plan Discussed with: CRNA and Anesthesiologist  Anesthesia Plan Comments:        Anesthesia Quick Evaluation

## 2021-08-03 ENCOUNTER — Ambulatory Visit (HOSPITAL_COMMUNITY): Payer: Medicare HMO | Admitting: Anesthesiology

## 2021-08-03 ENCOUNTER — Other Ambulatory Visit: Payer: Self-pay | Admitting: Orthopedic Surgery

## 2021-08-03 ENCOUNTER — Telehealth: Payer: Self-pay

## 2021-08-03 ENCOUNTER — Ambulatory Visit (HOSPITAL_COMMUNITY)
Admission: RE | Admit: 2021-08-03 | Discharge: 2021-08-03 | Disposition: A | Discharge status: Home / Self Care | Payer: Medicare HMO | Attending: Orthopedic Surgery | Admitting: Orthopedic Surgery

## 2021-08-03 ENCOUNTER — Ambulatory Visit (HOSPITAL_COMMUNITY): Payer: Medicare HMO

## 2021-08-03 ENCOUNTER — Encounter (HOSPITAL_COMMUNITY)
Admission: RE | Disposition: A | Discharge status: Home / Self Care | Payer: Self-pay | Source: Home / Self Care | Attending: Orthopedic Surgery

## 2021-08-03 ENCOUNTER — Encounter (HOSPITAL_COMMUNITY): Payer: Self-pay | Admitting: Orthopedic Surgery

## 2021-08-03 ENCOUNTER — Other Ambulatory Visit: Payer: Self-pay

## 2021-08-03 DIAGNOSIS — M21611 Bunion of right foot: Secondary | ICD-10-CM | POA: Diagnosis not present

## 2021-08-03 DIAGNOSIS — M6701 Short Achilles tendon (acquired), right ankle: Secondary | ICD-10-CM

## 2021-08-03 DIAGNOSIS — M205X1 Other deformities of toe(s) (acquired), right foot: Secondary | ICD-10-CM | POA: Diagnosis not present

## 2021-08-03 DIAGNOSIS — M2021 Hallux rigidus, right foot: Secondary | ICD-10-CM

## 2021-08-03 DIAGNOSIS — Q6689 Other  specified congenital deformities of feet: Secondary | ICD-10-CM | POA: Diagnosis not present

## 2021-08-03 HISTORY — DX: Cardiac murmur, unspecified: R01.1

## 2021-08-03 HISTORY — PX: GASTROCNEMIUS RECESSION: SHX863

## 2021-08-03 HISTORY — DX: Personal history of other diseases of the digestive system: Z87.19

## 2021-08-03 HISTORY — DX: Iron deficiency anemia, unspecified: D50.9

## 2021-08-03 HISTORY — DX: Essential (primary) hypertension: I10

## 2021-08-03 HISTORY — DX: Gastro-esophageal reflux disease without esophagitis: K21.9

## 2021-08-03 HISTORY — DX: Anemia, unspecified: D64.9

## 2021-08-03 HISTORY — DX: Other complications of anesthesia, initial encounter: T88.59XA

## 2021-08-03 HISTORY — PX: ARTHRODESIS METATARSALPHALANGEAL JOINT (MTPJ): SHX6566

## 2021-08-03 LAB — BASIC METABOLIC PANEL
Anion gap: 9 (ref 5–15)
BUN: 12 mg/dL (ref 6–20)
CO2: 24 mmol/L (ref 22–32)
Calcium: 8.9 mg/dL (ref 8.9–10.3)
Chloride: 105 mmol/L (ref 98–111)
Creatinine, Ser: 1.47 mg/dL — ABNORMAL HIGH (ref 0.44–1.00)
GFR, Estimated: 41 mL/min — ABNORMAL LOW (ref >60)
Glucose, Bld: 162 mg/dL — ABNORMAL HIGH (ref 70–99)
Potassium: 3.1 mmol/L — ABNORMAL LOW (ref 3.5–5.1)
Sodium: 138 mmol/L (ref 135–145)

## 2021-08-03 LAB — CBC
HCT: 38.4 % (ref 36.0–46.0)
Hemoglobin: 12.1 g/dL (ref 12.0–15.0)
MCH: 28.3 pg (ref 26.0–34.0)
MCHC: 31.5 g/dL (ref 30.0–36.0)
MCV: 89.7 fL (ref 80.0–100.0)
Platelets: 285 10*3/uL (ref 150–400)
RBC: 4.28 MIL/uL (ref 3.87–5.11)
RDW: 16.5 % — ABNORMAL HIGH (ref 11.5–15.5)
WBC: 9.5 10*3/uL (ref 4.0–10.5)
nRBC: 0 % (ref 0.0–0.2)

## 2021-08-03 SURGERY — FUSION, JOINT, GREAT TOE
Anesthesia: General | Site: Toe | Laterality: Right

## 2021-08-03 MED ORDER — PROMETHAZINE HCL 25 MG/ML IJ SOLN: 6.2500 mg | INTRAMUSCULAR | Status: DC | PRN

## 2021-08-03 MED ORDER — HYDROCODONE-ACETAMINOPHEN 5-325 MG PO TABS: 1.0000 | ORAL_TABLET | Freq: Four times a day (QID) | ORAL | 0 refills | Status: DC | PRN

## 2021-08-03 MED ORDER — PROPOFOL 10 MG/ML IV BOLUS
INTRAVENOUS | Status: DC | PRN
  Administered 2021-08-03: 160 mg via INTRAVENOUS

## 2021-08-03 MED ORDER — MIDAZOLAM HCL 2 MG/2ML IJ SOLN
INTRAMUSCULAR | Status: DC | PRN
  Administered 2021-08-03: 2 mg via INTRAVENOUS

## 2021-08-03 MED ORDER — LIDOCAINE 2% (20 MG/ML) 5 ML SYRINGE
INTRAMUSCULAR | Status: DC | PRN
  Administered 2021-08-03: 60 mg via INTRAVENOUS

## 2021-08-03 MED ORDER — CHLORHEXIDINE GLUCONATE 0.12 % MT SOLN
15.0000 mL | Freq: Once | OROMUCOSAL | Status: AC
  Administered 2021-08-03: 06:00:00 15 mL via OROMUCOSAL
  Filled 2021-08-03: qty 15

## 2021-08-03 MED ORDER — OXYCODONE HCL 5 MG/5ML PO SOLN: 5.0000 mg | Freq: Once | ORAL | Status: DC | PRN

## 2021-08-03 MED ORDER — MIDAZOLAM HCL 2 MG/2ML IJ SOLN
INTRAMUSCULAR | Status: AC
  Filled 2021-08-03: qty 2

## 2021-08-03 MED ORDER — LACTATED RINGERS IV SOLN: INTRAVENOUS | Status: DC

## 2021-08-03 MED ORDER — FENTANYL CITRATE (PF) 250 MCG/5ML IJ SOLN
INTRAMUSCULAR | Status: AC
  Filled 2021-08-03: qty 5

## 2021-08-03 MED ORDER — BUPIVACAINE-EPINEPHRINE (PF) 0.5% -1:200000 IJ SOLN
INTRAMUSCULAR | Status: DC | PRN
  Administered 2021-08-03: 30 mL via PERINEURAL

## 2021-08-03 MED ORDER — DEXAMETHASONE SODIUM PHOSPHATE 10 MG/ML IJ SOLN
INTRAMUSCULAR | Status: DC | PRN
  Administered 2021-08-03: 8 mg via INTRAVENOUS

## 2021-08-03 MED ORDER — ACETAMINOPHEN 500 MG PO TABS
1000.0000 mg | ORAL_TABLET | Freq: Once | ORAL | Status: AC
  Administered 2021-08-03: 06:00:00 1000 mg via ORAL
  Filled 2021-08-03: qty 2

## 2021-08-03 MED ORDER — FENTANYL CITRATE (PF) 250 MCG/5ML IJ SOLN
INTRAMUSCULAR | Status: DC | PRN
  Administered 2021-08-03: 50 ug via INTRAVENOUS

## 2021-08-03 MED ORDER — ONDANSETRON HCL 4 MG/2ML IJ SOLN
INTRAMUSCULAR | Status: DC | PRN
  Administered 2021-08-03: 4 mg via INTRAVENOUS

## 2021-08-03 MED ORDER — CEFAZOLIN SODIUM-DEXTROSE 2-4 GM/100ML-% IV SOLN
2.0000 g | INTRAVENOUS | Status: AC
  Administered 2021-08-03: 08:00:00 2 g via INTRAVENOUS
  Filled 2021-08-03: qty 100

## 2021-08-03 MED ORDER — EPHEDRINE SULFATE-NACL 50-0.9 MG/10ML-% IV SOSY
PREFILLED_SYRINGE | INTRAVENOUS | Status: DC | PRN
  Administered 2021-08-03: 10 mg via INTRAVENOUS
  Administered 2021-08-03: 15 mg via INTRAVENOUS

## 2021-08-03 MED ORDER — ORAL CARE MOUTH RINSE: 15.0000 mL | Freq: Once | OROMUCOSAL | Status: AC

## 2021-08-03 MED ORDER — FENTANYL CITRATE (PF) 100 MCG/2ML IJ SOLN: 25.0000 ug | INTRAMUSCULAR | Status: DC | PRN

## 2021-08-03 MED ORDER — PHENYLEPHRINE HCL-NACL 20-0.9 MG/250ML-% IV SOLN
INTRAVENOUS | Status: DC | PRN
  Administered 2021-08-03: 40 ug/min via INTRAVENOUS

## 2021-08-03 MED ORDER — OXYCODONE-ACETAMINOPHEN 10-325 MG PO TABS: 1.0000 | ORAL_TABLET | ORAL | 0 refills | Status: DC | PRN

## 2021-08-03 MED ORDER — OXYCODONE HCL 5 MG PO TABS: 5.0000 mg | ORAL_TABLET | Freq: Once | ORAL | Status: DC | PRN

## 2021-08-03 SURGICAL SUPPLY — 40 items
BAG COUNTER SPONGE SURGICOUNT (BAG) ×3 IMPLANT
BIT DRILL SOLID 2.0 X 110MM (DRILL) IMPLANT
BLADE OSCILLATING/SAGITTAL (BLADE) ×2
BLADE SW THK.38XMED NAR THN (BLADE) IMPLANT
BNDG COHESIVE 6X5 TAN NS LF (GAUZE/BANDAGES/DRESSINGS) ×1 IMPLANT
BNDG COHESIVE 6X5 TAN STRL LF (GAUZE/BANDAGES/DRESSINGS) ×3 IMPLANT
CAP PIN PROTECTOR ORTHO WHT (CAP) ×1 IMPLANT
COVER MAYO STAND STRL (DRAPES) ×3 IMPLANT
COVER SURGICAL LIGHT HANDLE (MISCELLANEOUS) ×3 IMPLANT
DRAPE U-SHAPE 47X51 STRL (DRAPES) ×3 IMPLANT
DRILL SOLID 2.0 X 110MM (DRILL) ×3
DRIVER FINISHING TWIST-OFF AO (ORTHOPEDIC DISPOSABLE SUPPLIES) ×1 IMPLANT
DRSG EMULSION OIL 3X3 NADH (GAUZE/BANDAGES/DRESSINGS) ×3 IMPLANT
DURAPREP 26ML APPLICATOR (WOUND CARE) ×3 IMPLANT
ELECT REM PT RETURN 9FT ADLT (ELECTROSURGICAL) ×3
ELECTRODE REM PT RTRN 9FT ADLT (ELECTROSURGICAL) ×2 IMPLANT
GAUZE SPONGE 4X4 12PLY STRL (GAUZE/BANDAGES/DRESSINGS) ×3 IMPLANT
GLOVE SURG ORTHO LTX SZ9 (GLOVE) ×3 IMPLANT
GLOVE SURG UNDER POLY LF SZ9 (GLOVE) ×3 IMPLANT
GOWN STRL REUS W/ TWL XL LVL3 (GOWN DISPOSABLE) ×4 IMPLANT
GOWN STRL REUS W/TWL XL LVL3 (GOWN DISPOSABLE) ×2
K-WIRE DBL TROCAR .045X4 (WIRE) ×6
K-WIRE SMOOTH TROCAR 2.0X150 (WIRE) ×6
KIT BASIN OR (CUSTOM PROCEDURE TRAY) ×3 IMPLANT
KIT TURNOVER KIT B (KITS) ×3 IMPLANT
KWIRE DBL TROCAR .045X4 (WIRE) IMPLANT
KWIRE SMOOTH TROCAR 2.0X150 (WIRE) IMPLANT
PACK ORTHO EXTREMITY (CUSTOM PROCEDURE TRAY) ×3 IMPLANT
PAD ARMBOARD 7.5X6 YLW CONV (MISCELLANEOUS) ×6 IMPLANT
PADDING CAST ABS 4INX4YD NS (CAST SUPPLIES) ×1
PADDING CAST ABS COTTON 4X4 ST (CAST SUPPLIES) ×2 IMPLANT
PLATE MTP 10D SHORT RT (Plate) ×1 IMPLANT
SCREW HCS TWIST-OFF 2.0X12MM (Screw) ×1 IMPLANT
SCREW LOCK PLATE R3 2.7X14 (Screw) ×4 IMPLANT
SCREW LOCK PLATE R3 2.7X16 (Screw) ×1 IMPLANT
SCREW NON LOCKING 2.7X18 (Screw) ×1 IMPLANT
SPONGE T-LAP 18X18 ~~LOC~~+RFID (SPONGE) ×3 IMPLANT
SUT ETHILON 2 0 PSLX (SUTURE) ×3 IMPLANT
UNDERPAD 30X36 HEAVY ABSORB (UNDERPADS AND DIAPERS) ×3 IMPLANT
WATER STERILE IRR 1000ML POUR (IV SOLUTION) ×3 IMPLANT

## 2021-08-03 NOTE — Discharge Instructions (Signed)
Call the office to schedule an appointment. Dr. Sharol Given  office [phone]

## 2021-08-03 NOTE — Transfer of Care (Signed)
Immediate Anesthesia Transfer of Care Note  Patient: Sophia Smith  Procedure(s) Performed: FUSION RIGHT GREAT TOE METATARSALPHALANGEAL JOINT (MTPJ), WEIL OSTEOTOMY 2ND METATARSAL, PROXIMAL INERPHALANGEAL RESECTION 2ND AND 3RD TOES (Right: Toe) RIGHT GASTROCNEMIUS RECESSION (Right)  Patient Location: PACU  Anesthesia Type:GA combined with regional for post-op pain  Level of Consciousness: awake, alert  and oriented  Airway & Oxygen Therapy: Patient Spontanous Breathing and Patient connected to nasal cannula oxygen  Post-op Assessment: Report given to RN and Post -op Vital signs reviewed and stable  Post vital signs: Reviewed and stable  Last Vitals:  Vitals Value Taken Time  BP 147/83 08/03/21 0849  Temp    Pulse 93 08/03/21 0849  Resp 21 08/03/21 0849  SpO2 96 % 08/03/21 0849  Vitals shown include unvalidated device data.  Last Pain:  Vitals:   08/03/21 0603  TempSrc: Oral  PainSc:          Complications: No notable events documented.

## 2021-08-03 NOTE — Anesthesia Postprocedure Evaluation (Signed)
Anesthesia Post Note  Patient: Sophia Smith  Procedure(s) Performed: FUSION RIGHT GREAT TOE METATARSALPHALANGEAL JOINT (MTPJ), WEIL OSTEOTOMY 2ND METATARSAL, PROXIMAL INERPHALANGEAL RESECTION 2ND AND 3RD TOES (Right: Toe) RIGHT GASTROCNEMIUS RECESSION (Right)     Patient location during evaluation: PACU Anesthesia Type: General Level of consciousness: awake and alert Pain management: pain level controlled Vital Signs Assessment: post-procedure vital signs reviewed and stable Respiratory status: spontaneous breathing, nonlabored ventilation and respiratory function stable Cardiovascular status: stable and blood pressure returned to baseline Anesthetic complications: no   No notable events documented.  Last Vitals:  Vitals:   08/03/21 0904 08/03/21 0915  BP: 132/75 (!) 144/85  Pulse: 85 95  Resp: 11 15  Temp:  36.6 C  SpO2: 94% 97%    Last Pain:  Vitals:   08/03/21 0915  TempSrc:   PainSc: 0-No pain        RLE Motor Response: No movement due to regional block (08/03/21 0915) RLE Sensation: No sensation (absent) (08/03/21 0915)      Audry Pili

## 2021-08-03 NOTE — Telephone Encounter (Signed)
Please advise 

## 2021-08-03 NOTE — Op Note (Signed)
08/03/2021  9:28 AM  PATIENT:  Sophia Smith    PRE-OPERATIVE DIAGNOSIS:  HALLUX RIGIDUS AND CLAW TOES  POST-OPERATIVE DIAGNOSIS:  Same  PROCEDURE:  FUSION RIGHT GREAT TOE METATARSALPHALANGEAL JOINT (MTPJ), WEIL OSTEOTOMY 2ND METATARSAL, PROXIMAL INERPHALANGEAL RESECTION 2ND AND 3RD TOES, RIGHT GASTROCNEMIUS RECESSION  SURGEON:  Newt Minion, MD  PHYSICIAN ASSISTANT:None ANESTHESIA:   General  PREOPERATIVE INDICATIONS:  Sophia Smith is a  60 y.o. female with a diagnosis of [place] who failed conservative measures and elected for surgical management.    The risks benefits and alternatives were discussed with the patient preoperatively including but not limited to the risks of infection, bleeding, nerve injury, cardiopulmonary complications, the need for revision surgery, among others, and the patient was willing to proceed.  OPERATIVE IMPLANTS: Dorsal Paragon fusion plate 10 degree. 2 mm screw for the Weil osteotomy.  0.  045 K wires for the PIP second and third toes  @ENCIMAGES @  OPERATIVE FINDINGS: C-arm fluoroscopy verified alignment in both AP and lateral planes.  OPERATIVE PROCEDURE: Patient was brought the operating room after undergoing a regional anesthetic.  After adequate levels anesthesia were obtained patient's right lower extremity was prepped using DuraPrep draped into a sterile field a timeout was called.  Attention was first focused on the gastrocnemius recession.  A medial longitudinal incision was made 15 cm proximal to the medial malleolus.  This was carried bluntly down to the gastrocnemius fascia this was incised under direct visualization and this brought her dorsiflexion from 20 degrees short of neutral to 20 degrees past neutral.  The wound was irrigated normal saline and the incision closed using 2-0 nylon.  Attention was then focused on the MTP joint.  A medial longitudinal incision was made at the MTP joint.  This was carried down  through the joint and the joint was exposed.  A K wire was placed in the metatarsal head and the cup reamer was used 17 mm to debride down to healthy viable subchondral bone.  The cone reamer was then used on the base of the first proximal phalanx and the cone reamer 17 mm was debrided down to healthy viable bone.  The joint was debrided reduced and the plate was placed dorsally and secured C-arm fluoroscopy verified alignment.  Wounds irrigated normal saline incision closed using 2-0 nylon.  A third incision was then made over the second toe PIP and MTP joint.  This was carried down midline to the MTP joint retractors were placed an oscillating saw was used to perform a Weil osteotomy.  Dorsal overhanging bone was resected the metatarsal head was translated proximally and a 2 mm screw was used to secure the Weil osteotomy.  The PIP joint was resected for the second toe and a K wire was used to reduce the PIP fixed flexion contracture.  A separate incision was then made over the PIP joint of the third toe this was carried down to the joint and oscillating saw was used to resect the PIP joint the fixed flexion contracture was straightened and a 0.045 K wire was used to stabilize the PIP joint.  The wounds were irrigated normal saline incision closed using 2-0 nylon a sterile dressing was applied patient was taken the PACU in stable condition.   DISCHARGE PLANNING:  Antibiotic duration: Preoperative antibiotics  Weightbearing: Touchdown weightbearing on the right  Pain medication: Prescription for 10 mg Percocet  Dressing care/ Wound VAC: Dry dressing reinforce as needed  Ambulatory  devices: Crutches  Discharge to: Home.  Follow-up: In the office 1 week post operative.

## 2021-08-03 NOTE — Anesthesia Procedure Notes (Signed)
Anesthesia Regional Block: Popliteal block   Pre-Anesthetic Checklist: , timeout performed,  Correct Patient, Correct Site, Correct Laterality,  Correct Procedure, Correct Position, site marked,  Risks and benefits discussed,  Surgical consent,  Pre-op evaluation,  At surgeon's request and post-op pain management  Laterality: Right  Prep: chloraprep       Needles:  Injection technique: Single-shot  Needle Type: Echogenic Needle     Needle Length: 10cm  Needle Gauge: 21     Additional Needles:   Narrative:  Start time: 08/03/2021 7:02 AM End time: 08/03/2021 7:05 AM Injection made incrementally with aspirations every 5 mL.  Performed by: Personally  Anesthesiologist: Audry Pili, MD  Additional Notes: No pain on injection. No increased resistance to injection. Injection made in 5cc increments. Good needle visualization. Patient tolerated the procedure well.

## 2021-08-03 NOTE — Telephone Encounter (Signed)
Pt called into the office and stated she wasn't going to pick up the other medication that was sent in. She changed her mind and would like the Vicodin instead

## 2021-08-03 NOTE — H&P (Signed)
Sophia Smith is an 60 y.o. female.   Chief Complaint: Pain with weightbearing right foot unable to walk barefoot. HPI: Patient is a 60 year old woman who presents with right foot pain at the MTP joint as well as from the clawing of her toes.  Patient states she is unable to ambulate barefoot.  Patient states she was considering back surgery but this is on hold she is currently in a pain clinic.  She states she is trying to decrease her smoking.  Past Medical History:  Diagnosis Date   Anemia    Back disorder    Cellulitis of left lower leg 16/05/9603   Complication of anesthesia    was having a back surgery, prone position, patient vomitted, had to be intubated- no problems since that time.   Fibromyalgia    GERD (gastroesophageal reflux disease)    Heart murmur    History of hiatal hernia    Hypertension    Iron deficiency anemia    recieved IV iron.   Knee gives out    Other disorders of continuity of bone, right pelvic region and thigh    Stroke ([place])    2 TIA's    Past Surgical History:  Procedure Laterality Date   CHOLECYSTECTOMY     [place]  Right Carotid Right 2012   Dissecton with pseudoanevrysm   LEG SURGERY Bilateral    numerous leg surgeries post accident age 15   SIGMOID RESECTION / RECTOPEXY     Traumatic amputation Right    5th toe, ran over by a lawn mower.  was a child.    Family History  Problem Relation Age of Onset   Heart attack Father    Cancer Father    Breast cancer Maternal Grandmother    Social History:  reports that she has been smoking cigarettes. She has a 20.00 pack-year smoking history. She has never used smokeless tobacco. She reports that she does not drink alcohol and does not use drugs.  Allergies:  Allergies  Allergen Reactions   Nsaids     Aleve,ibuprofen all seems to caused sharp pains in abdominal and discomfort.     Medications Prior to Admission  Medication Sig Dispense Refill   albuterol (VENTOLIN HFA) 108 (90 Base)  MCG/ACT inhaler Inhale 2 puffs into the lungs every 4 (four) hours as needed for wheezing or shortness of breath. 18 g 1   amitriptyline (ELAVIL) 150 MG tablet TAKE ONE TABLET BY MOUTH AT BEDTIME (Patient taking differently: Take 150 mg by mouth at bedtime.) 90 tablet 1   amLODipine (NORVASC) 10 MG tablet TAKE ONE TABLET BY MOUTH DAILY 90 tablet 1   atorvastatin (LIPITOR) 40 MG tablet Take 1 tablet (40 mg total) by mouth daily. 90 tablet 3   DULoxetine (CYMBALTA) 60 MG capsule Take 1 capsule (60 mg total) by mouth 2 (two) times daily. 180 capsule 1   gabapentin (NEURONTIN) 300 MG capsule Take 900 mg by mouth 3 (three) times daily.     hydrochlorothiazide (HYDRODIURIL) 12.5 MG tablet TAKE ONE TABLET BY MOUTH DAILY. NEED TO HAVE LABS TAKEN FOR FURTHER REFILLS. 30 tablet 0   oxyCODONE-acetaminophen (PERCOCET) 10-325 MG tablet Take 1 tablet by mouth every 4 (four) hours as needed for pain (Back).     pantoprazole (PROTONIX) 40 MG tablet TAKE ONE TABLET BY MOUTH DAILY (Patient taking differently: Take 40 mg by mouth daily.) 90 tablet 3   cyclobenzaprine (FLEXERIL) 10 MG tablet Take 1 tablet (10 mg total) by mouth at  bedtime. (Patient taking differently: Take 10 mg by mouth daily as needed for muscle spasms.) 30 tablet 5   diclofenac sodium (VOLTAREN) 1 % GEL Apply 4 g topically 4 (four) times daily. (Patient taking differently: Apply 4 g topically daily as needed (hip pain).) 1 Tube 3   doxycycline (VIBRA-TABS) 100 MG tablet Take 1 tablet (100 mg total) by mouth 2 (two) times daily. (Patient not taking: Reported on 07/25/2021) 20 tablet 0   furosemide (LASIX) 40 MG tablet Take 1 tablet (40 mg total) by mouth daily. As needed for lower extremity edema. (Patient taking differently: Take 40 mg by mouth daily as needed for fluid or edema.) 30 tablet 0   NARCAN 4 MG/0.1ML LIQD nasal spray kit Place 1 spray into the nose once.     potassium chloride SA (KLOR-CON) 20 MEQ tablet Take 2 tablets (40 mEq total) by  mouth daily. (Patient taking differently: Take 40 mEq by mouth daily as needed (Only take lasix).) 14 tablet 3   predniSONE (DELTASONE) 20 MG tablet Take 1 tablet (20 mg total) by mouth 2 (two) times daily with a meal. (Patient not taking: Reported on 07/25/2021) 10 tablet 0   promethazine-dextromethorphan (PROMETHAZINE-DM) 6.25-15 MG/5ML syrup Take 5 mLs by mouth 4 (four) times daily as needed for cough. (Patient not taking: Reported on 07/25/2021) 180 mL 0   triamcinolone (KENALOG) 0.1 % Apply 1 application topically 2 (two) times daily. Combine with Eucerin over dry/scaly areas. (Patient taking differently: Apply 1 application topically daily as needed (dry skin). Combine with Eucerin over dry/scaly areas.) 45 g 1    Results for orders placed or performed during the hospital encounter of 08/03/21 (from the past 48 hour(s))  Basic metabolic panel per protocol     Status: Abnormal   Collection Time: 08/03/21  5:45 AM  Result Value Ref Range   Sodium 138 135 - 145 mmol/L   Potassium 3.1 (L) 3.5 - 5.1 mmol/L   Chloride 105 98 - 111 mmol/L   CO2 24 22 - 32 mmol/L   Glucose, Bld 162 (H) 70 - 99 mg/dL    Comment: Glucose reference range applies only to samples taken after fasting for at least 8 hours.   BUN 12 6 - 20 mg/dL   Creatinine, Ser 1.47 (H) 0.44 - 1.00 mg/dL   Calcium 8.9 8.9 - 10.3 mg/dL   GFR, Estimated 41 (L) >60 mL/min    Comment: (NOTE) Calculated using the CKD-EPI Creatinine Equation (2021)    Anion gap 9 5 - 15    Comment: Performed at [facility] [address] [place], [place] [address]  CBC per protocol     Status: Abnormal   Collection Time: 08/03/21  5:45 AM  Result Value Ref Range   WBC 9.5 4.0 - 10.5 K/uL   RBC 4.28 3.87 - 5.11 MIL/uL   Hemoglobin 12.1 12.0 - 15.0 g/dL   HCT 38.4 36.0 - 46.0 %   MCV 89.7 80.0 - 100.0 fL   MCH 28.3 26.0 - 34.0 pg   MCHC 31.5 30.0 - 36.0 g/dL   RDW 16.5 (H) 11.5 - 15.5 %   Platelets 285 150 - 400 K/uL   nRBC 0.0 0.0 -  0.2 %    Comment: Performed at [facility], [place] [address] [place], [place] [address]   No results found.  Review of Systems  All other systems reviewed and are negative.  Blood pressure (!) 142/87, pulse (!) 101, temperature 98.9  F (37.2 C), temperature source Oral, resp. rate 18, height $RemoveBe'5\' 9"'[id number]$  (1.753 m), weight 71.7 kg, last menstrual period 12/11/2012, SpO2 96 %. Physical Exam  Patient is alert, oriented, no adenopathy, well-dressed, normal affect, normal respiratory effort. Examination patient has palpable pulses.  She has hallux valgus and hallux rigidus of the right great toe.  She has fixed clawing of the second and third toe PIP joints.  Radiograph shows a long second third and fourth metatarsal.  With her knee extended she has dorsiflexion only to neutral with contracture of the Achilles.  She does have ulceration on the second toe secondary to pressure from the great toe.  She does have venous swelling.  There is callus beneath the second metatarsal head this was debrided with a 10 blade knife after informed consent and the area of debridement was 1 cm in diameter. Assessment/Plan 1. Hallux rigidus, right foot   2. Hallux valgus (acquired), right foot   3. Claw toe, acquired, right       Plan: Discussed that surgical intervention is an option this would include outpatient surgery for gastrocnemius recession fusion of the great toe MTP joint Weil osteotomy for the second metatarsal head as well as PIP resections for the second and third toes risks and benefits were discussed including risks of the wound not healing secondary to smoking risk of persistent pain need for additional surgery.  Patient states she understands and would like to proceed with surgery.  Newt Minion, MD 08/03/2021, 7:07 AM

## 2021-08-03 NOTE — Anesthesia Procedure Notes (Signed)
Procedure Name: LMA Insertion Date/Time: 08/03/2021 7:30 AM Performed by: Minerva Ends, CRNA Pre-anesthesia Checklist: Patient identified, Emergency Drugs available, Suction available and Patient being monitored Patient Re-evaluated:Patient Re-evaluated prior to induction Oxygen Delivery Method: Circle system utilized Preoxygenation: Pre-oxygenation with 100% oxygen Induction Type: IV induction LMA: LMA inserted LMA Size: 3.0 Tube type: Oral Number of attempts: 1 Placement Confirmation: positive ETCO2 and breath sounds checked- equal and bilateral Tube secured with: Tape Dental Injury: Teeth and Oropharynx as per pre-operative assessment

## 2021-08-07 ENCOUNTER — Encounter (HOSPITAL_COMMUNITY): Payer: Self-pay | Admitting: Orthopedic Surgery

## 2021-08-10 ENCOUNTER — Telehealth: Payer: Self-pay

## 2021-08-10 ENCOUNTER — Ambulatory Visit (INDEPENDENT_AMBULATORY_CARE_PROVIDER_SITE_OTHER): Payer: Medicare HMO | Admitting: Family

## 2021-08-10 ENCOUNTER — Ambulatory Visit (HOSPITAL_COMMUNITY)
Admission: RE | Admit: 2021-08-10 | Discharge: 2021-08-10 | Disposition: A | Discharge status: Home / Self Care | Payer: Medicare HMO | Source: Ambulatory Visit | Attending: Family | Admitting: Family

## 2021-08-10 ENCOUNTER — Encounter: Payer: Self-pay | Admitting: Family

## 2021-08-10 ENCOUNTER — Other Ambulatory Visit: Payer: Self-pay

## 2021-08-10 ENCOUNTER — Ambulatory Visit: Payer: Self-pay

## 2021-08-10 DIAGNOSIS — M79661 Pain in right lower leg: Secondary | ICD-10-CM

## 2021-08-10 DIAGNOSIS — M205X1 Other deformities of toe(s) (acquired), right foot: Secondary | ICD-10-CM

## 2021-08-10 DIAGNOSIS — M2021 Hallux rigidus, right foot: Secondary | ICD-10-CM

## 2021-08-10 MED ORDER — HYDROCODONE-ACETAMINOPHEN 5-325 MG PO TABS: 1.0000 | ORAL_TABLET | Freq: Four times a day (QID) | ORAL | 0 refills | Status: DC | PRN

## 2021-08-10 NOTE — Telephone Encounter (Signed)
FYI-  Per Apolonio Schneiders with [facility], patient is Negative for DVT, right leg.  Please advise.  Thank you.

## 2021-08-10 NOTE — Progress Notes (Signed)
Post-Op Visit Note   Patient: Sophia Smith           Date of Birth: April 22, 1961           MRN: [id number] Visit Date: 08/10/2021 PCP: Donella Stade, PA-C  Chief Complaint:  Chief Complaint  Patient presents with   Right Foot - Routine Post Op    08/03/21 right foot great toe MTP fusion     HPI:  HPI The patient is a 61 year old woman who presents status post right great toe MTP fusion.  She is concerned about the pins, they are causing her pain.  Ortho Exam On examination of right foot incisions are well approximated sutures there is no gaping no drainage there is mild surrounding erythema there is no warmth the pins are in place.  These are not loose.    Visit Diagnoses:  1. Hallux rigidus, right foot   2. Claw toe, acquired, right   3. Right calf pain     Plan: Reassurance provided.  We will leave the pins in place for 1 more week.  Discussed paining the pin tracks with Neosporin.  Daily Dial soap cleansing of the incisions dry dressing changes minimizing weightbearing plan to harvest pins in 1 more week  Follow-Up Instructions: No follow-ups on file.   Imaging: No results found.  Orders:  Orders Placed This Encounter  Procedures   XR Toe Great Right   VAS [place] LOWER EXTREMITY VENOUS (DVT)   Meds ordered this encounter  Medications   HYDROcodone-acetaminophen (NORCO/VICODIN) 5-325 MG tablet    Sig: Take 1-2 tablets by mouth every 6 (six) hours as needed for moderate pain.    Dispense:  60 tablet    Refill:  0     PMFS History: Patient Active Problem List   Diagnosis Date Noted   Contracture of right Achilles tendon    Hallux rigidus, right foot    Acquired claw toe of right foot    PCO (posterior capsular opacification) 09/28/2020   Venous stasis dermatitis of both lower extremities 09/05/2020   Edema of left ankle 09/05/2020   Centrilobular emphysema ([place]) 07/31/2020   Bunion of great toe of right foot 04/18/2020   Fall 03/27/2020   Current every  day smoker 03/27/2020   Status post left knee replacement 03/22/2020   Panlobular emphysema ([place]) 01/25/2020   Pulmonary nodules 01/25/2020   Aortic atherosclerosis ([place]) 01/24/2020   Rib pain on left side 01/24/2020   Normocytic anemia 01/14/2020   Hypokalemia 01/14/2020   Dyslipidemia (high LDL; low HDL) 03/03/2019   Vertebral artery dissection ([facility]) 09/01/2018   Tobacco abuse 09/01/2018   Plantar callus 08/13/2018   Chronic venous stasis 05/14/2018   Thumb pain, left 03/22/2018   Extensor carpi radialis brevis tenosynovitis 03/22/2018   Non-intractable vomiting with nausea 03/22/2018   Hiatal hernia 03/22/2018   Bilateral cataracts 11/04/2017   Disorder of refraction 11/04/2017   Mitral regurgitation 10/22/2017   Tricuspid regurgitation 10/22/2017   Left atrial enlargement 10/22/2017   Lumbar spondylolysis 09/23/2017   Drug-seeking behavior 09/19/2017   Essential hypertension 09/19/2017   Opioid dependence with withdrawal ([place]) 09/19/2017   Chronic bilateral low back pain with bilateral sciatica 09/19/2017   Cigarette nicotine dependence without complication 22/29/7989   Iron deficiency anemia 05/13/2017   Internal hemorrhoid 12/05/2015   Insomnia secondary to chronic pain 06/01/2015   Upper back pain on right side 06/01/2015   S/P total knee replacement 09/13/2014   Left ankle pain 09/13/2014  Plantar fasciitis, bilateral 03/21/2014   Bilateral lower extremity edema 10/26/2013   Left knee pain 10/26/2013   Tricompartment degenerative joint disease of knee 10/26/2013   Elevated alkaline phosphatase level 10/15/2013   Aneurysm artery, neck ([place]) 09/15/2013   Pelvic fracture ([place]) 09/15/2013   Depression 09/15/2013   Lymphedema of leg 09/13/2013   Pneumothorax on left 09/13/2013   Left rib fracture 09/13/2013   Fibromyalgia 09/13/2013   Lumbar disc disease 09/13/2013   Past Medical History:  Diagnosis Date   Anemia    Back disorder    Cellulitis of left lower leg  58/85/0277   Complication of anesthesia    was having a back surgery, prone position, patient vomitted, had to be intubated- no problems since that time.   Fibromyalgia    GERD (gastroesophageal reflux disease)    Heart murmur    History of hiatal hernia    Hypertension    Iron deficiency anemia    recieved IV iron.   Knee gives out    Other disorders of continuity of bone, right pelvic region and thigh    Stroke ([place])    2 TIA's    Family History  Problem Relation Age of Onset   Heart attack Father    Cancer Father    Breast cancer Maternal Grandmother     Past Surgical History:  Procedure Laterality Date   ARTHRODESIS METATARSALPHALANGEAL JOINT (MTPJ) Right 08/03/2021   Procedure: FUSION RIGHT GREAT TOE METATARSALPHALANGEAL JOINT (MTPJ), WEIL OSTEOTOMY 2ND METATARSAL, PROXIMAL INERPHALANGEAL RESECTION 2ND AND 3RD TOES;  Surgeon: Newt Minion, MD;  Location: [facility];  Service: Orthopedics;  Laterality: Right;   CHOLECYSTECTOMY     [place]  Right Carotid Right 2012   Dissecton with pseudoanevrysm   GASTROCNEMIUS RECESSION Right 08/03/2021   Procedure: RIGHT GASTROCNEMIUS RECESSION;  Surgeon: Newt Minion, MD;  Location: [facility];  Service: Orthopedics;  Laterality: Right;   LEG SURGERY Bilateral    numerous leg surgeries post accident age 47   SIGMOID RESECTION / RECTOPEXY     Traumatic amputation Right    5th toe, ran over by a lawn mower.  was a child.   Social History   Occupational History   Occupation: Marine scientist    Comment: retired  Tobacco Use   Smoking status: Every Day    Packs/day: 0.50    Years: 40.00    Pack years: 20.00    Types: Cigarettes   Smokeless tobacco: Never  Vaping Use   Vaping Use: Never used  Substance and Sexual Activity   Alcohol use: No   Drug use: No   Sexual activity: Not Currently

## 2021-08-10 NOTE — Progress Notes (Incomplete)
Post-Op Visit Note   Patient: Sophia Smith           Date of Birth: [birth year]           MRN: [id number] Visit Date: 08/10/2021 PCP: Donella Stade, PA-C  Chief Complaint:  Chief Complaint  Patient presents with   Right Foot - Routine Post Op    08/03/21 right foot great toe MTP fusion     HPI:  HPI  Ortho Exam ***  Visit Diagnoses:  1. Hallux rigidus, right foot   2. Claw toe, acquired, right     Plan: ***  Follow-Up Instructions: No follow-ups on file.   Imaging: No results found.  Orders:  Orders Placed This Encounter  Procedures   XR Toe Great Right   No orders of the defined types were placed in this encounter.    PMFS History: Patient Active Problem List   Diagnosis Date Noted   Contracture of right Achilles tendon    Hallux rigidus, right foot    Acquired claw toe of right foot    PCO (posterior capsular opacification) 09/28/2020   Venous stasis dermatitis of both lower extremities 09/05/2020   Edema of left ankle 09/05/2020   Centrilobular emphysema ([place]) 07/31/2020   Bunion of great toe of right foot 04/18/2020   Fall 03/27/2020   Current every day smoker 03/27/2020   Status post left knee replacement 03/22/2020   Panlobular emphysema ([place]) 01/25/2020   Pulmonary nodules 01/25/2020   Aortic atherosclerosis ([place]) 01/24/2020   Rib pain on left side 01/24/2020   Normocytic anemia 01/14/2020   Hypokalemia 01/14/2020   Dyslipidemia (high LDL; low HDL) 03/03/2019   Vertebral artery dissection ([facility]) 09/01/2018   Tobacco abuse 09/01/2018   Plantar callus 08/13/2018   Chronic venous stasis 05/14/2018   Thumb pain, left 03/22/2018   Extensor carpi radialis brevis tenosynovitis 03/22/2018   Non-intractable vomiting with nausea 03/22/2018   Hiatal hernia 03/22/2018   Bilateral cataracts 11/04/2017   Disorder of refraction 11/04/2017   Mitral regurgitation 10/22/2017   Tricuspid regurgitation 10/22/2017    Left atrial enlargement 10/22/2017   Lumbar spondylolysis 09/23/2017   Drug-seeking behavior 09/19/2017   Essential hypertension 09/19/2017   Opioid dependence with withdrawal ([place]) 09/19/2017   Chronic bilateral low back pain with bilateral sciatica 09/19/2017   Cigarette nicotine dependence without complication 34/19/3790   Iron deficiency anemia 05/13/2017   Internal hemorrhoid 12/05/2015   Insomnia secondary to chronic pain 06/01/2015   Upper back pain on right side 06/01/2015   S/P total knee replacement 09/13/2014   Left ankle pain 09/13/2014   Plantar fasciitis, bilateral 03/21/2014   Bilateral lower extremity edema 10/26/2013   Left knee pain 10/26/2013   Tricompartment degenerative joint disease of knee 10/26/2013   Elevated alkaline phosphatase level 10/15/2013   Aneurysm artery, neck ([place]) 09/15/2013   Pelvic fracture ([place]) 09/15/2013   Depression 09/15/2013   Lymphedema of leg 09/13/2013   Pneumothorax on left 09/13/2013   Left rib fracture 09/13/2013   Fibromyalgia 09/13/2013   Lumbar disc disease 09/13/2013   Past Medical History:  Diagnosis Date   Anemia    Back disorder    Cellulitis of left lower leg 24/04/7352   Complication of anesthesia    was having a back surgery, prone position, patient vomitted, had to be intubated- no problems since that time.   Fibromyalgia    GERD (gastroesophageal reflux disease)    Heart murmur    History of hiatal hernia  Hypertension    Iron deficiency anemia    recieved IV iron.   Knee gives out    Other disorders of continuity of bone, right pelvic region and thigh    Stroke ([place])    2 TIA's    Family History  Problem Relation Age of Onset   Heart attack Father    Cancer Father    Breast cancer Maternal Grandmother     Past Surgical History:  Procedure Laterality Date   ARTHRODESIS METATARSALPHALANGEAL JOINT (MTPJ) Right 08/03/2021   Procedure: FUSION RIGHT GREAT TOE  METATARSALPHALANGEAL JOINT (MTPJ), WEIL OSTEOTOMY 2ND METATARSAL, PROXIMAL INERPHALANGEAL RESECTION 2ND AND 3RD TOES;  Surgeon: Newt Minion, MD;  Location: [facility];  Service: Orthopedics;  Laterality: Right;   CHOLECYSTECTOMY     [place]  Right Carotid Right 2012   Dissecton with pseudoanevrysm   GASTROCNEMIUS RECESSION Right 08/03/2021   Procedure: RIGHT GASTROCNEMIUS RECESSION;  Surgeon: Newt Minion, MD;  Location: [facility];  Service: Orthopedics;  Laterality: Right;   LEG SURGERY Bilateral    numerous leg surgeries post accident age 61   SIGMOID RESECTION / RECTOPEXY     Traumatic amputation Right    5th toe, ran over by a lawn mower.  was a child.   Social History   Occupational History   Occupation: Marine scientist    Comment: retired  Tobacco Use   Smoking status: Every Day    Packs/day: 0.50    Years: 40.00    Pack years: 20.00    Types: Cigarettes   Smokeless tobacco: Never  Vaping Use   Vaping Use: Never used  Substance and Sexual Activity   Alcohol use: No   Drug use: No   Sexual activity: Not Currently

## 2021-08-10 NOTE — Telephone Encounter (Signed)
Notified NP will hold pending advisement.

## 2021-08-10 NOTE — Progress Notes (Signed)
Lower extremity venous RT study completed.  Preliminary results relayed to April for Coralyn Pear, NP.  See CV Proc for preliminary results report.   Darlin Coco, RDMS, RVT

## 2021-08-10 NOTE — Telephone Encounter (Signed)
I called pt and advised per Junie Panning to apply dry dressing and ace bandage from the toe to the top of the calf. To work on calf pump and follow up with Dr. Sharol Given next week. Appt made and she will call with any questions or concerns

## 2021-08-15 ENCOUNTER — Other Ambulatory Visit: Payer: Self-pay | Admitting: Physician Assistant

## 2021-08-15 DIAGNOSIS — F3342 Major depressive disorder, recurrent, in full remission: Secondary | ICD-10-CM

## 2021-08-15 DIAGNOSIS — M797 Fibromyalgia: Secondary | ICD-10-CM

## 2021-08-16 ENCOUNTER — Other Ambulatory Visit: Payer: Self-pay

## 2021-08-16 ENCOUNTER — Ambulatory Visit (INDEPENDENT_AMBULATORY_CARE_PROVIDER_SITE_OTHER): Payer: Medicare HMO | Admitting: Orthopedic Surgery

## 2021-08-16 DIAGNOSIS — M2021 Hallux rigidus, right foot: Secondary | ICD-10-CM

## 2021-08-16 DIAGNOSIS — M205X1 Other deformities of toe(s) (acquired), right foot: Secondary | ICD-10-CM

## 2021-08-17 ENCOUNTER — Encounter: Payer: Self-pay | Admitting: Orthopedic Surgery

## 2021-08-17 ENCOUNTER — Encounter: Payer: Medicare HMO | Admitting: Family

## 2021-08-17 NOTE — Progress Notes (Signed)
Office Visit Note   Patient: Sophia Smith           Date of Birth: 03-29-1961           MRN: [id number] Visit Date: 08/16/2021              Requested by: Donella Stade, PA-C [address] [place],  [place] [address] PCP: Donella Stade, [place]  Chief Complaint  Patient presents with   Right Foot - Routine Post Op    08/03/21 fusion right GT MTP joint Weil 2nd MT PIP resection 2nd 3rd       HPI: Patient is a 61 year old woman who is status post right great toe MTP fusion as well as Weil osteotomies PIP resection and gastrocnemius recession.  Assessment & Plan: Visit Diagnoses:  1. Hallux rigidus, right foot   2. Claw toe, acquired, right     Plan: Continue with protected weightbearing she was placed in a compression sock follow-up in 1 week to remove sutures.  Follow-Up Instructions: Return in about 1 week (around 08/23/2021).   Ortho Exam  Patient is alert, oriented, no adenopathy, well-dressed, normal affect, normal respiratory effort. Examination patient does have increased swelling in her foot the pins were mobile and these were removed.  There is no cellulitis no drainage.  Patient is placed in a compression sock to help with the swelling.  Imaging: No results found. No images are attached to the encounter.  Labs: Lab Results  Component Value Date   LABORGA NO GROWTH 12/01/2013     Lab Results  Component Value Date   ALBUMIN 3.4 (L) 09/24/2017   ALBUMIN 3.8 04/20/2014   ALBUMIN 3.6 10/25/2013    No results found for: MG Lab Results  Component Value Date   VD25OH 33 10/14/2013    No results found for: PREALBUMIN CBC EXTENDED Latest Ref Rng & Units 08/03/2021 01/13/2020 08/31/2018  WBC 4.0 - 10.5 K/uL 9.5 7.6 6.1  RBC 3.87 - 5.11 MIL/uL 4.28 4.22 4.40  HGB 12.0 - 15.0 g/dL 12.1 11.3(L) 13.1  HCT 36.0 - 46.0 % 38.4 34.7(L) 38.6  PLT 150 - 400 K/uL 285 361 266  NEUTROABS 1,500 - 7,800 cells/uL - - 3,007  LYMPHSABS 850 - 3,900  cells/uL - - 2,208     There is no height or weight on file to calculate BMI.  Orders:  No orders of the defined types were placed in this encounter.  No orders of the defined types were placed in this encounter.    Procedures: No procedures performed  Clinical Data: No additional findings.  ROS:  All other systems negative, except as noted in the HPI. Review of Systems  Objective: Vital Signs: LMP 12/11/2012   Specialty Comments:  No specialty comments available.  PMFS History: Patient Active Problem List   Diagnosis Date Noted   Contracture of right Achilles tendon    Hallux rigidus, right foot    Acquired claw toe of right foot    PCO (posterior capsular opacification) 09/28/2020   Venous stasis dermatitis of both lower extremities 09/05/2020   Edema of left ankle 09/05/2020   Centrilobular emphysema ([facility]) 07/31/2020   Bunion of great toe of right foot 04/18/2020   Fall 03/27/2020   Current every day smoker 03/27/2020   Status post left knee replacement 03/22/2020   Panlobular emphysema ([place]) 01/25/2020   Pulmonary nodules 01/25/2020   Aortic atherosclerosis ([facility]) 01/24/2020   Rib pain on left side 01/24/2020  Normocytic anemia 01/14/2020   Hypokalemia 01/14/2020   Dyslipidemia (high LDL; low HDL) 03/03/2019   Vertebral artery dissection (HCC) 09/01/2018   Tobacco abuse 09/01/2018   Plantar callus 08/13/2018   Chronic venous stasis 05/14/2018   Thumb pain, left 03/22/2018   Extensor carpi radialis brevis tenosynovitis 03/22/2018   Non-intractable vomiting with nausea 03/22/2018   Hiatal hernia 03/22/2018   Bilateral cataracts 11/04/2017   Disorder of refraction 11/04/2017   Mitral regurgitation 10/22/2017   Tricuspid regurgitation 10/22/2017   Left atrial enlargement 10/22/2017   Lumbar spondylolysis 09/23/2017   Drug-seeking behavior 09/19/2017   Essential hypertension 09/19/2017   Opioid dependence with withdrawal ([place]) 09/19/2017   Chronic  bilateral low back pain with bilateral sciatica 09/19/2017   Cigarette nicotine dependence without complication 47/42/5956   Iron deficiency anemia 05/13/2017   Internal hemorrhoid 12/05/2015   Insomnia secondary to chronic pain 06/01/2015   Upper back pain on right side 06/01/2015   S/P total knee replacement 09/13/2014   Left ankle pain 09/13/2014   Plantar fasciitis, bilateral 03/21/2014   Bilateral lower extremity edema 10/26/2013   Left knee pain 10/26/2013   Tricompartment degenerative joint disease of knee 10/26/2013   Elevated alkaline phosphatase level 10/15/2013   Aneurysm artery, neck ([facility] [facility]) 09/15/2013   Pelvic fracture ([facility]) 09/15/2013   Depression 09/15/2013   Lymphedema of leg 09/13/2013   Pneumothorax on left 09/13/2013   Left rib fracture 09/13/2013   Fibromyalgia 09/13/2013   Lumbar disc disease 09/13/2013   Past Medical History:  Diagnosis Date   Anemia    Back disorder    Cellulitis of left lower leg 38/75/6433   Complication of anesthesia    was having a back surgery, prone position, patient vomitted, had to be intubated- no problems since that time.   Fibromyalgia    GERD (gastroesophageal reflux disease)    Heart murmur    History of hiatal hernia    Hypertension    Iron deficiency anemia    recieved IV iron.   Knee gives out    Other disorders of continuity of bone, right pelvic region and thigh    Stroke ([facility])    2 TIA's    Family History  Problem Relation Age of Onset   Heart attack Father    Cancer Father    Breast cancer Maternal Grandmother     Past Surgical History:  Procedure Laterality Date   ARTHRODESIS METATARSALPHALANGEAL JOINT (MTPJ) Right 08/03/2021   Procedure: FUSION RIGHT GREAT TOE METATARSALPHALANGEAL JOINT (MTPJ), WEIL OSTEOTOMY 2ND METATARSAL, PROXIMAL INERPHALANGEAL RESECTION 2ND AND 3RD TOES;  Surgeon: Newt Minion, MD;  Location: [facility];  Service: Orthopedics;  Laterality: Right;   CHOLECYSTECTOMY     [place]  Right  Carotid Right 2012   Dissecton with pseudoanevrysm   GASTROCNEMIUS RECESSION Right 08/03/2021   Procedure: RIGHT GASTROCNEMIUS RECESSION;  Surgeon: Newt Minion, MD;  Location: [facility];  Service: Orthopedics;  Laterality: Right;   LEG SURGERY Bilateral    numerous leg surgeries post accident age 70   SIGMOID RESECTION / RECTOPEXY     Traumatic amputation Right    5th toe, ran over by a lawn mower.  was a child.   Social History   Occupational History   Occupation: Marine scientist    Comment: retired  Tobacco Use   Smoking status: Every Day    Packs/day: 0.50    Years: 40.00    Pack years: 20.00    Types: Cigarettes   Smokeless tobacco: Never  Vaping Use   Vaping Use: Never used  Substance and Sexual Activity   Alcohol use: No   Drug use: No   Sexual activity: Not Currently

## 2021-08-23 ENCOUNTER — Ambulatory Visit (INDEPENDENT_AMBULATORY_CARE_PROVIDER_SITE_OTHER): Payer: Medicare HMO | Admitting: Orthopedic Surgery

## 2021-08-23 ENCOUNTER — Encounter: Payer: Self-pay | Admitting: Orthopedic Surgery

## 2021-08-23 DIAGNOSIS — M205X1 Other deformities of toe(s) (acquired), right foot: Secondary | ICD-10-CM

## 2021-08-23 DIAGNOSIS — M2021 Hallux rigidus, right foot: Secondary | ICD-10-CM

## 2021-08-23 NOTE — Progress Notes (Signed)
Office Visit Note   Patient: Sophia Smith           Date of Birth: Oct 11, 1960           MRN: [id number] Visit Date: 08/23/2021              Requested by: Donella Stade, PA-C [address] [place],  [place] [address] PCP: Donella Stade, [place]  Chief Complaint  Patient presents with   Right Foot - Follow-up    08/03/21 fusion right GT MTP joint Weil 2nd MT PIP resection 2nd 3rd       HPI: Patient is a 61 year old woman who presents 3 weeks status post right forefoot reconstruction with great toe MTP fusion PIP resection of the second and third toes and a Weil osteotomy of the second metatarsal.  Assessment & Plan: Visit Diagnoses:  1. Hallux rigidus, right foot   2. Claw toe, acquired, right     Plan: The incisions are healing well we will harvest the sutures today.  She was given felt spacers to place in the first and second webspace.  Continue with a postoperative shoe.  Anticipate advance to regular shoewear at follow-up.  Follow-Up Instructions: Return in about 4 weeks (around 09/20/2021).   Ortho Exam  Patient is alert, oriented, no adenopathy, well-dressed, normal affect, normal respiratory effort. Examination patient still has swelling in the toes there is no overlapping of the toes there is no cellulitis no drainage the incisions have healed nicely  Imaging: No results found. No images are attached to the encounter.  Labs: Lab Results  Component Value Date   LABORGA NO GROWTH 12/01/2013     Lab Results  Component Value Date   ALBUMIN 3.4 (L) 09/24/2017   ALBUMIN 3.8 04/20/2014   ALBUMIN 3.6 10/25/2013    No results found for: MG Lab Results  Component Value Date   VD25OH 33 10/14/2013    No results found for: PREALBUMIN CBC EXTENDED Latest Ref Rng & Units 08/03/2021 01/13/2020 08/31/2018  WBC 4.0 - 10.5 K/uL 9.5 7.6 6.1  RBC 3.87 - 5.11 MIL/uL 4.28 4.22 4.40  HGB 12.0 - 15.0 g/dL 12.1 11.3(L) 13.1  HCT 36.0 - 46.0 % 38.4  34.7(L) 38.6  PLT 150 - 400 K/uL 285 361 266  NEUTROABS 1,500 - 7,800 cells/uL - - 3,007  LYMPHSABS 850 - 3,900 cells/uL - - 2,208     There is no height or weight on file to calculate BMI.  Orders:  No orders of the defined types were placed in this encounter.  No orders of the defined types were placed in this encounter.    Procedures: No procedures performed  Clinical Data: No additional findings.  ROS:  All other systems negative, except as noted in the HPI. Review of Systems  Objective: Vital Signs: LMP 12/11/2012   Specialty Comments:  No specialty comments available.  PMFS History: Patient Active Problem List   Diagnosis Date Noted   Contracture of right Achilles tendon    Hallux rigidus, right foot    Acquired claw toe of right foot    PCO (posterior capsular opacification) 09/28/2020   Venous stasis dermatitis of both lower extremities 09/05/2020   Edema of left ankle 09/05/2020   Centrilobular emphysema ([facility] [place]) 07/31/2020   Bunion of great toe of right foot 04/18/2020   Fall 03/27/2020   Current every day smoker 03/27/2020   Status post left knee replacement 03/22/2020   Panlobular emphysema ([facility]) 01/25/2020  Pulmonary nodules 01/25/2020   Aortic atherosclerosis ([place] [facility]) 01/24/2020   Rib pain on left side 01/24/2020   Normocytic anemia 01/14/2020   Hypokalemia 01/14/2020   Dyslipidemia (high LDL; low HDL) 03/03/2019   Vertebral artery dissection (HCC) 09/01/2018   Tobacco abuse 09/01/2018   Plantar callus 08/13/2018   Chronic venous stasis 05/14/2018   Thumb pain, left 03/22/2018   Extensor carpi radialis brevis tenosynovitis 03/22/2018   Non-intractable vomiting with nausea 03/22/2018   Hiatal hernia 03/22/2018   Bilateral cataracts 11/04/2017   Disorder of refraction 11/04/2017   Mitral regurgitation 10/22/2017   Tricuspid regurgitation 10/22/2017   Left atrial enlargement 10/22/2017   Lumbar spondylolysis 09/23/2017   Drug-seeking behavior  09/19/2017   Essential hypertension 09/19/2017   Opioid dependence with withdrawal ([facility]) 09/19/2017   Chronic bilateral low back pain with bilateral sciatica 09/19/2017   Cigarette nicotine dependence without complication 79/89/2119   Iron deficiency anemia 05/13/2017   Internal hemorrhoid 12/05/2015   Insomnia secondary to chronic pain 06/01/2015   Upper back pain on right side 06/01/2015   S/P total knee replacement 09/13/2014   Left ankle pain 09/13/2014   Plantar fasciitis, bilateral 03/21/2014   Bilateral lower extremity edema 10/26/2013   Left knee pain 10/26/2013   Tricompartment degenerative joint disease of knee 10/26/2013   Elevated alkaline phosphatase level 10/15/2013   Aneurysm artery, neck ([place]) 09/15/2013   Pelvic fracture ([facility]) 09/15/2013   Depression 09/15/2013   Lymphedema of leg 09/13/2013   Pneumothorax on left 09/13/2013   Left rib fracture 09/13/2013   Fibromyalgia 09/13/2013   Lumbar disc disease 09/13/2013   Past Medical History:  Diagnosis Date   Anemia    Back disorder    Cellulitis of left lower leg 41/74/0814   Complication of anesthesia    was having a back surgery, prone position, patient vomitted, had to be intubated- no problems since that time.   Fibromyalgia    GERD (gastroesophageal reflux disease)    Heart murmur    History of hiatal hernia    Hypertension    Iron deficiency anemia    recieved IV iron.   Knee gives out    Other disorders of continuity of bone, right pelvic region and thigh    Stroke ([facility])    2 TIA's    Family History  Problem Relation Age of Onset   Heart attack Father    Cancer Father    Breast cancer Maternal Grandmother     Past Surgical History:  Procedure Laterality Date   ARTHRODESIS METATARSALPHALANGEAL JOINT (MTPJ) Right 08/03/2021   Procedure: FUSION RIGHT GREAT TOE METATARSALPHALANGEAL JOINT (MTPJ), WEIL OSTEOTOMY 2ND METATARSAL, PROXIMAL INERPHALANGEAL RESECTION 2ND AND 3RD TOES;  Surgeon: Newt Minion, MD;  Location: [facility];  Service: Orthopedics;  Laterality: Right;   CHOLECYSTECTOMY     [place]  Right Carotid Right 2012   Dissecton with pseudoanevrysm   GASTROCNEMIUS RECESSION Right 08/03/2021   Procedure: RIGHT GASTROCNEMIUS RECESSION;  Surgeon: Newt Minion, MD;  Location[facility];  Service: Orthopedics;  Laterality: Right;   LEG SURGERY Bilateral    numerous leg surgeries post accident age 41   SIGMOID RESECTION / RECTOPEXY     Traumatic amputation Right    5th toe, ran over by a lawn mower.  was a child.   Social History   Occupational History   Occupation: Marine scientist    Comment: retired  Tobacco Use   Smoking status: Every Day    Packs/day: 0.50  Years: 40.00    Pack years: 20.00    Types: Cigarettes   Smokeless tobacco: Never  Vaping Use   Vaping Use: Never used  Substance and Sexual Activity   Alcohol use: No   Drug use: No   Sexual activity: Not Currently

## 2021-09-04 ENCOUNTER — Ambulatory Visit (INDEPENDENT_AMBULATORY_CARE_PROVIDER_SITE_OTHER): Payer: Medicare HMO | Admitting: Orthopedic Surgery

## 2021-09-04 ENCOUNTER — Telehealth: Payer: Self-pay

## 2021-09-04 ENCOUNTER — Other Ambulatory Visit: Payer: Self-pay

## 2021-09-04 ENCOUNTER — Encounter: Payer: Self-pay | Admitting: Orthopedic Surgery

## 2021-09-04 DIAGNOSIS — M2021 Hallux rigidus, right foot: Secondary | ICD-10-CM

## 2021-09-04 DIAGNOSIS — M205X1 Other deformities of toe(s) (acquired), right foot: Secondary | ICD-10-CM

## 2021-09-04 MED ORDER — HYDROCODONE-ACETAMINOPHEN 5-325 MG PO TABS: 1.0000 | ORAL_TABLET | ORAL | 0 refills | Status: DC | PRN

## 2021-09-04 NOTE — Telephone Encounter (Signed)
Patient called stating her pain mgmt doc is aware she is taking medication from [facility] since she had surgery; she wanted me to make sure I let you guys know that.

## 2021-09-04 NOTE — Progress Notes (Signed)
Office Visit Note   Patient: Sophia Smith           Date of Birth: 1961-01-02           MRN: [id number] Visit Date: 09/04/2021              Requested by: Donella Stade, PA-C [address] [place],  [place] [address] PCP: Donella Stade, [place]  Chief Complaint  Patient presents with   Right Leg - Routine Post Op    08/03/2021 gastroc recession    Right Foot - Routine Post Op    08/03/2021 right GT fusion , weil 2nd PIP resection 2nd and 3rd      HPI: Patient is a 61 year old woman who is status post right great toe fusion Weil osteotomy and PIP resection of the second toe as well as gastrocnemius recession.  Patient states she has some tenderness over the gastrocnemius recession incision.  Assessment & Plan: Visit Diagnoses:  1. Hallux rigidus, right foot   2. Claw toe, acquired, right     Plan: Patient does have some scar tissue developing recommended scar massage over the gastrocnemius recession incision recommended range of motion of the ankle and toes continue with knee-high compression.  Follow-Up Instructions: Return in about 4 weeks (around 10/02/2021).   Ortho Exam  Patient is alert, oriented, no adenopathy, well-dressed, normal affect, normal respiratory effort. Examination there is some scar tissue developing beneath the incision of the gastrocnemius recession there is no redness no cellulitis no drainage no signs of infection.  Her toes are straight with some swelling of the toes.  There is no cellulitis.  Imaging: No results found. No images are attached to the encounter.  Labs: Lab Results  Component Value Date   LABORGA NO GROWTH 12/01/2013     Lab Results  Component Value Date   ALBUMIN 3.4 (L) 09/24/2017   ALBUMIN 3.8 04/20/2014   ALBUMIN 3.6 10/25/2013    No results found for: MG Lab Results  Component Value Date   VD25OH 33 10/14/2013    No results found for: PREALBUMIN CBC EXTENDED Latest Ref Rng & Units  08/03/2021 01/13/2020 08/31/2018  WBC 4.0 - 10.5 K/uL 9.5 7.6 6.1  RBC 3.87 - 5.11 MIL/uL 4.28 4.22 4.40  HGB 12.0 - 15.0 g/dL 12.1 11.3(L) 13.1  HCT 36.0 - 46.0 % 38.4 34.7(L) 38.6  PLT 150 - 400 K/uL 285 361 266  NEUTROABS 1,500 - 7,800 cells/uL - - 3,007  LYMPHSABS 850 - 3,900 cells/uL - - 2,208     There is no height or weight on file to calculate BMI.  Orders:  No orders of the defined types were placed in this encounter.  Meds ordered this encounter  Medications   HYDROcodone-acetaminophen (NORCO/VICODIN) 5-325 MG tablet    Sig: Take 1 tablet by mouth every 4 (four) hours as needed for moderate pain.    Dispense:  30 tablet    Refill:  0     Procedures: No procedures performed  Clinical Data: No additional findings.  ROS:  All other systems negative, except as noted in the HPI. Review of Systems  Objective: Vital Signs: LMP 12/11/2012   Specialty Comments:  No specialty comments available.  PMFS History: Patient Active Problem List   Diagnosis Date Noted   Contracture of right Achilles tendon    Hallux rigidus, right foot    Acquired claw toe of right foot    PCO (posterior capsular opacification) 09/28/2020  Venous stasis dermatitis of both lower extremities 09/05/2020   Edema of left ankle 09/05/2020   Centrilobular emphysema ([facility]) 07/31/2020   Bunion of great toe of right foot 04/18/2020   Fall 03/27/2020   Current every day smoker 03/27/2020   Status post left knee replacement 03/22/2020   Panlobular emphysema ([facility]) 01/25/2020   Pulmonary nodules 01/25/2020   Aortic atherosclerosis ([facility]) 01/24/2020   Rib pain on left side 01/24/2020   Normocytic anemia 01/14/2020   Hypokalemia 01/14/2020   Dyslipidemia (high LDL; low HDL) 03/03/2019   Vertebral artery dissection ([facility]) 09/01/2018   Tobacco abuse 09/01/2018   Plantar callus 08/13/2018   Chronic venous stasis 05/14/2018   Thumb pain, left 03/22/2018   Extensor carpi radialis brevis  tenosynovitis 03/22/2018   Non-intractable vomiting with nausea 03/22/2018   Hiatal hernia 03/22/2018   Bilateral cataracts 11/04/2017   Disorder of refraction 11/04/2017   Mitral regurgitation 10/22/2017   Tricuspid regurgitation 10/22/2017   Left atrial enlargement 10/22/2017   Lumbar spondylolysis 09/23/2017   Drug-seeking behavior 09/19/2017   Essential hypertension 09/19/2017   Opioid dependence with withdrawal ([facility]) 09/19/2017   Chronic bilateral low back pain with bilateral sciatica 09/19/2017   Cigarette nicotine dependence without complication 92/42/6834   Iron deficiency anemia 05/13/2017   Internal hemorrhoid 12/05/2015   Insomnia secondary to chronic pain 06/01/2015   Upper back pain on right side 06/01/2015   S/P total knee replacement 09/13/2014   Left ankle pain 09/13/2014   Plantar fasciitis, bilateral 03/21/2014   Bilateral lower extremity edema 10/26/2013   Left knee pain 10/26/2013   Tricompartment degenerative joint disease of knee 10/26/2013   Elevated alkaline phosphatase level 10/15/2013   Aneurysm artery, neck ([facility]) 09/15/2013   Pelvic fracture ([facility]) 09/15/2013   Depression 09/15/2013   Lymphedema of leg 09/13/2013   Pneumothorax on left 09/13/2013   Left rib fracture 09/13/2013   Fibromyalgia 09/13/2013   Lumbar disc disease 09/13/2013   Past Medical History:  Diagnosis Date   Anemia    Back disorder    Cellulitis of left lower leg 19/62/2297   Complication of anesthesia    was having a back surgery, prone position, patient vomitted, had to be intubated- no problems since that time.   Fibromyalgia    GERD (gastroesophageal reflux disease)    Heart murmur    History of hiatal hernia    Hypertension    Iron deficiency anemia    recieved IV iron.   Knee gives out    Other disorders of continuity of bone, right pelvic region and thigh    Stroke ([facility])    2 TIA's    Family History  Problem Relation Age of Onset   Heart attack Father    Cancer  Father    Breast cancer Maternal Grandmother     Past Surgical History:  Procedure Laterality Date   ARTHRODESIS METATARSALPHALANGEAL JOINT (MTPJ) Right 08/03/2021   Procedure: FUSION RIGHT GREAT TOE METATARSALPHALANGEAL JOINT (MTPJ), WEIL OSTEOTOMY 2ND METATARSAL, PROXIMAL INERPHALANGEAL RESECTION 2ND AND 3RD TOES;  Surgeon: Newt Minion, MD;  Location: [facility];  Service: Orthopedics;  Laterality: Right;   CHOLECYSTECTOMY     [place]  Right Carotid Right 2012   Dissecton with pseudoanevrysm   GASTROCNEMIUS RECESSION Right 08/03/2021   Procedure: RIGHT GASTROCNEMIUS RECESSION;  Surgeon: Newt Minion, MD;  Location: [facility];  Service: Orthopedics;  Laterality: Right;   LEG SURGERY Bilateral    numerous leg surgeries post accident age 6   SIGMOID RESECTION /  RECTOPEXY     Traumatic amputation Right    5th toe, ran over by a lawn mower.  was a child.   Social History   Occupational History   Occupation: Marine scientist    Comment: retired  Tobacco Use   Smoking status: Every Day    Packs/day: 0.50    Years: 40.00    Pack years: 20.00    Types: Cigarettes   Smokeless tobacco: Never  Vaping Use   Vaping Use: Never used  Substance and Sexual Activity   Alcohol use: No   Drug use: No   Sexual activity: Not Currently

## 2021-09-04 NOTE — Telephone Encounter (Signed)
Sophia Smith with Sophia Smith called and wanted to make sure Dr. Sharol Given was aware that patient is taking chronic percocet since the medication that he sent in today relatively had the same instructions they didn't know if she was alternating them or what.   Please advise

## 2021-09-05 NOTE — Telephone Encounter (Signed)
FYI

## 2021-09-05 NOTE — Telephone Encounter (Signed)
Hydrocodone 5-325mg  take q4h prn sent in yesterday. See below

## 2021-09-20 ENCOUNTER — Encounter: Payer: Medicare HMO | Admitting: Orthopedic Surgery

## 2021-09-24 ENCOUNTER — Ambulatory Visit: Payer: Medicare HMO

## 2021-09-27 ENCOUNTER — Ambulatory Visit: Payer: Medicare HMO

## 2021-10-02 ENCOUNTER — Encounter: Payer: Medicare HMO | Admitting: Orthopedic Surgery

## 2021-10-04 ENCOUNTER — Ambulatory Visit: Payer: Medicare HMO

## 2021-10-11 ENCOUNTER — Ambulatory Visit (INDEPENDENT_AMBULATORY_CARE_PROVIDER_SITE_OTHER): Payer: Medicare HMO

## 2021-10-11 ENCOUNTER — Other Ambulatory Visit: Payer: Self-pay

## 2021-10-11 DIAGNOSIS — F17210 Nicotine dependence, cigarettes, uncomplicated: Secondary | ICD-10-CM | POA: Diagnosis not present

## 2021-10-15 ENCOUNTER — Other Ambulatory Visit: Payer: Self-pay | Admitting: Acute Care

## 2021-10-15 DIAGNOSIS — Z87891 Personal history of nicotine dependence: Secondary | ICD-10-CM

## 2021-10-15 DIAGNOSIS — F17210 Nicotine dependence, cigarettes, uncomplicated: Secondary | ICD-10-CM

## 2021-10-18 ENCOUNTER — Ambulatory Visit (INDEPENDENT_AMBULATORY_CARE_PROVIDER_SITE_OTHER): Payer: Medicare HMO | Admitting: Orthopedic Surgery

## 2021-10-18 DIAGNOSIS — M205X1 Other deformities of toe(s) (acquired), right foot: Secondary | ICD-10-CM

## 2021-10-18 DIAGNOSIS — M2021 Hallux rigidus, right foot: Secondary | ICD-10-CM

## 2021-10-21 ENCOUNTER — Encounter: Payer: Self-pay | Admitting: Orthopedic Surgery

## 2021-10-21 NOTE — Progress Notes (Signed)
? ?Office Visit Note ?  ?Patient: Sophia Smith           ?Date of Birth: [birth year]           ?MRN: [id number] ?Visit Date: 10/18/2021 ?             ?Requested by: Donella Stade, PA-C ?[address] ?[address] ?[place],  [place] [address] ?PCP: Donella Stade, PA-C ? ?Chief Complaint  ?Patient presents with  ? Right Foot - Routine Post Op  ?  Right GT fusion weil 2nd, PIP resection 2nd and 3rd gastroc  ? ? ? ? ?HPI: ?Patient is a 61 year old woman who presents 2-1/2 months status post right great toe fusion as well as Weil osteotomy and PIP resection of the lesser toes.  Patient has been working on scar massage and using compression.  She states she has swelling in her foot after wearing regular shoes for 2 hours. ? ?Assessment & Plan: ?Visit Diagnoses:  ?1. Hallux rigidus, right foot   ?2. Claw toe, acquired, right   ? ? ?Plan: Recommended continue with compression stockings stiff soled shoes.  Continue scar massage. ? ?Follow-Up Instructions: Return if symptoms worsen or fail to improve.  ? ?Ortho Exam ? ?Patient is alert, oriented, no adenopathy, well-dressed, normal affect, normal respiratory effort. ?Examination there is minimal swelling there is no redness cellulitis no signs of infection.  Her toes are congruent. ? ?Imaging: ?No results found. ?No images are attached to the encounter. ? ?Labs: ?Lab Results  ?Component Value Date  ? LABORGA NO GROWTH 12/01/2013  ? ? ? ?Lab Results  ?Component Value Date  ? ALBUMIN 3.4 (L) 09/24/2017  ? ALBUMIN 3.8 04/20/2014  ? ALBUMIN 3.6 10/25/2013  ? ? ?No results found for: MG ?Lab Results  ?Component Value Date  ? VD25OH 33 10/14/2013  ? ? ?No results found for: PREALBUMIN ?CBC EXTENDED Latest Ref Rng & Units 08/03/2021 01/13/2020 08/31/2018  ?WBC 4.0 - 10.5 K/uL 9.5 7.6 6.1  ?RBC 3.87 - 5.11 MIL/uL 4.28 4.22 4.40  ?HGB 12.0 - 15.0 g/dL 12.1 11.3(L) 13.1  ?HCT 36.0 - 46.0 % 38.4 34.7(L) 38.6  ?PLT 150 - 400 K/uL 285 361 266  ?NEUTROABS 1,500 - 7,800 cells/uL - -  3,007  ?LYMPHSABS 850 - 3,900 cells/uL - - 2,208  ? ? ? ?There is no height or weight on file to calculate BMI. ? ?Orders:  ?No orders of the defined types were placed in this encounter. ? ?No orders of the defined types were placed in this encounter. ? ? ? Procedures: ?No procedures performed ? ?Clinical Data: ?No additional findings. ? ?ROS: ? ?All other systems negative, except as noted in the HPI. ?Review of Systems ? ?Objective: ?Vital Signs: LMP 12/11/2012  ? ?Specialty Comments:  ?No specialty comments available. ? ?PMFS History: ?Patient Active Problem List  ? Diagnosis Date Noted  ? Contracture of right Achilles tendon   ? Hallux rigidus, right foot   ? Acquired claw toe of right foot   ? PCO (posterior capsular opacification) 09/28/2020  ? Venous stasis dermatitis of both lower extremities 09/05/2020  ? Edema of left ankle 09/05/2020  ? Centrilobular emphysema ([place]) 07/31/2020  ? Bunion of great toe of right foot 04/18/2020  ? Fall 03/27/2020  ? Current every day smoker 03/27/2020  ? Status post left knee replacement 03/22/2020  ? Panlobular emphysema ([place]) 01/25/2020  ? Pulmonary nodules 01/25/2020  ? Aortic atherosclerosis ([place]) 01/24/2020  ? Rib pain  on left side 01/24/2020  ? Normocytic anemia 01/14/2020  ? Hypokalemia 01/14/2020  ? Dyslipidemia (high LDL; low HDL) 03/03/2019  ? Vertebral artery dissection ([place]) 09/01/2018  ? Tobacco abuse 09/01/2018  ? Plantar callus 08/13/2018  ? Chronic venous stasis 05/14/2018  ? Thumb pain, left 03/22/2018  ? Extensor carpi radialis brevis tenosynovitis 03/22/2018  ? Non-intractable vomiting with nausea 03/22/2018  ? Hiatal hernia 03/22/2018  ? Bilateral cataracts 11/04/2017  ? Disorder of refraction 11/04/2017  ? Mitral regurgitation 10/22/2017  ? Tricuspid regurgitation 10/22/2017  ? Left atrial enlargement 10/22/2017  ? Lumbar spondylolysis 09/23/2017  ? Drug-seeking behavior 09/19/2017  ? Essential hypertension 09/19/2017  ? Opioid dependence with withdrawal  ([place]) 09/19/2017  ? Chronic bilateral low back pain with bilateral sciatica 09/19/2017  ? Cigarette nicotine dependence without complication 35/57/3220  ? Iron deficiency anemia 05/13/2017  ? Internal hemorrhoid 12/05/2015  ? Insomnia secondary to chronic pain 06/01/2015  ? Upper back pain on right side 06/01/2015  ? S/P total knee replacement 09/13/2014  ? Left ankle pain 09/13/2014  ? Plantar fasciitis, bilateral 03/21/2014  ? Bilateral lower extremity edema 10/26/2013  ? Left knee pain 10/26/2013  ? Tricompartment degenerative joint disease of knee 10/26/2013  ? Elevated alkaline phosphatase level 10/15/2013  ? Aneurysm artery, neck ([place]) 09/15/2013  ? Pelvic fracture ([place]) 09/15/2013  ? Depression 09/15/2013  ? Lymphedema of leg 09/13/2013  ? Pneumothorax on left 09/13/2013  ? Left rib fracture 09/13/2013  ? Fibromyalgia 09/13/2013  ? Lumbar disc disease 09/13/2013  ? ?Past Medical History:  ?Diagnosis Date  ? Anemia   ? Back disorder   ? Cellulitis of left lower leg 08/04/2017  ? Complication of anesthesia   ? was having a back surgery, prone position, patient vomitted, had to be intubated- no problems since that time.  ? Fibromyalgia   ? GERD (gastroesophageal reflux disease)   ? Heart murmur   ? History of hiatal hernia   ? Hypertension   ? Iron deficiency anemia   ? recieved IV iron.  ? Knee gives out   ? Other disorders of continuity of bone, right pelvic region and thigh   ? Stroke [facility])   ? 2 TIA's  ?  ?Family History  ?Problem Relation Age of Onset  ? Heart attack Father   ? Cancer Father   ? Breast cancer Maternal Grandmother   ?  ?Past Surgical History:  ?Procedure Laterality Date  ? ARTHRODESIS METATARSALPHALANGEAL JOINT (MTPJ) Right 08/03/2021  ? Procedure: FUSION RIGHT GREAT TOE METATARSALPHALANGEAL JOINT (MTPJ), WEIL OSTEOTOMY 2ND METATARSAL, PROXIMAL INERPHALANGEAL RESECTION 2ND AND 3RD TOES;  Surgeon: Newt Minion, MD;  Location: [facility];  Service: Orthopedics;  Laterality: Right;  ?  CHOLECYSTECTOMY    ? [place]  Right Carotid Right 2012  ? Dissecton with pseudoanevrysm  ? GASTROCNEMIUS RECESSION Right 08/03/2021  ? Procedure: RIGHT GASTROCNEMIUS RECESSION;  Surgeon: Newt Minion, MD;  Location: [facility];  Service: Orthopedics;  Laterality: Right;  ? LEG SURGERY Bilateral   ? numerous leg surgeries post accident age 72  ? SIGMOID RESECTION / RECTOPEXY    ? Traumatic amputation Right   ? 5th toe, ran over by a lawn mower.  was a child.  ? ?Social History  ? ?Occupational History  ? 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 Smoking status: Every Day  ?  Packs/day: 0.50  ?  Years: 40.00  ?  Pack years: 20.00  ?  Types: Cigarettes  ?  Smokeless tobacco: Never  ?Vaping Use  ? Vaping Use: Never used  ?Substance and Sexual Activity  ? Alcohol use: No  ? Drug use: No  ? Sexual activity: Not Currently  ? ? ? ? ? ?

## 2021-11-09 ENCOUNTER — Other Ambulatory Visit: Payer: Medicare HMO

## 2021-11-19 ENCOUNTER — Ambulatory Visit (INDEPENDENT_AMBULATORY_CARE_PROVIDER_SITE_OTHER): Payer: Medicare HMO | Admitting: Physician Assistant

## 2021-11-19 DIAGNOSIS — Z0000 Encounter for general adult medical examination without abnormal findings: Secondary | ICD-10-CM | POA: Diagnosis not present

## 2021-11-19 NOTE — Progress Notes (Signed)
? ? ?MEDICARE ANNUAL WELLNESS VISIT ? ?11/19/2021 ? ?Telephone Visit Disclaimer ?This Medicare AWV was conducted by telephone due to national recommendations for restrictions regarding the COVID-19 Pandemic (e.g. social distancing).  I verified, using two identifiers, that I am speaking with Sophia Smith or their authorized healthcare agent. I discussed the limitations, risks, security, and privacy concerns of performing an evaluation and management service by telephone and the potential availability of an in-person appointment in the future. The patient expressed understanding and agreed to proceed.  ?Location of Patient: Home ?Location of Provider (nurse):  In the office. ? ?Subjective:  ? ? ?Sophia Smith is a 61 y.o. female patient of Sophia Stade, PA-C who had a Medicare Annual Wellness Visit today via telephone. Sophia Smith is Retired and lives with their daughter. she has 1 child. she reports that she is socially active and does interact with friends/family regularly. she is minimally physically active and enjoys reading. ? ?Patient Care Team: ?Lavada Mesi as PCP - General (Family Medicine) ?Awilda Metro, PA-C as Consulting Physician (Pain Medicine) ? ? ?  11/19/2021  ?  2:24 PM 08/03/2021  ?  6:08 AM 03/15/2019  ?  2:10 PM 11/06/2017  ? 11:49 AM 10/10/2017  ? 11:56 AM 10/03/2017  ?  1:05 PM 09/24/2017  ?  2:31 PM  ?Advanced Directives  ?Does Patient Have a Medical Advance Directive? No No No Yes Yes Yes Yes  ?Type of Theatre stage manager of [place];Living will  [place];Living will Living will  ?Does patient want to make changes to medical advance directive?     No - Patient declined No - Patient declined   ?Copy of [place] in Chart?      No - copy requested   ?Would patient like information on creating a medical advance directive? No - Patient declined  Yes (MAU/Ambulatory/Procedural Areas - Information given)   No - Patient  declined   ? ? ?Hospital Utilization Over the Past 12 Months: ?# of hospitalizations or ER visits: 0 ?# of surgeries: 1 ? ?Review of Systems    ?Patient reports that her overall health is unchanged compared to last year. ? ?History obtained from chart review and the patient ? ?Patient Reported Readings (BP, Pulse, CBG, Weight, etc) ?none ? ?Pain Assessment ?Pain : 0-10 ?Pain Score: 5  ?Pain Type: Chronic pain ?Pain Location: Back ?Pain Orientation: Lower ?Pain Descriptors / Indicators: Constant ?Pain Onset: More than a month ago ?Pain Frequency: Constant ?Pain Relieving Factors: rest and medication ? ?Pain Relieving Factors: rest and medication ? ?Current Medications & Allergies (verified) ?Allergies as of 11/19/2021   ? ?   Reactions  ? Nsaids   ? Aleve,ibuprofen all seems to caused sharp pains in abdominal and discomfort.   ? ?  ? ?  ?Medication List  ?  ? ?  ? Accurate as of November 19, 2021  2:37 PM. If you have any questions, ask your nurse or doctor.  ?  ?  ? ?  ? ?albuterol 108 (90 Base) MCG/ACT inhaler ?Commonly known as: VENTOLIN HFA ?Inhale 2 puffs into the lungs every 4 (four) hours as needed for wheezing or shortness of breath. ?  ?amitriptyline 150 MG tablet ?Commonly known as: ELAVIL ?TAKE ONE TABLET BY MOUTH AT BEDTIME ?  ?amLODipine 10 MG tablet ?Commonly known as: NORVASC ?TAKE ONE TABLET BY MOUTH DAILY ?  ?atorvastatin 40 MG tablet ?Commonly known as: LIPITOR ?  Take 1 tablet (40 mg total) by mouth daily. ?  ?cyclobenzaprine 10 MG tablet ?Commonly known as: FLEXERIL ?Take 1 tablet (10 mg total) by mouth at bedtime. ?What changed:  ?when to take this ?reasons to take this ?  ?diclofenac sodium 1 % Gel ?Commonly known as: VOLTAREN ?Apply 4 g topically 4 (four) times daily. ?What changed:  ?when to take this ?reasons to take this ?  ?DULoxetine 60 MG capsule ?Commonly known as: CYMBALTA ?TAKE ONE CAPSULE BY MOUTH TWICE A DAY ?  ?furosemide 40 MG tablet ?Commonly known as: LASIX ?Take 1 tablet (40 mg total)  by mouth daily. As needed for lower extremity edema. ?What changed:  ?when to take this ?reasons to take this ?additional instructions ?  ?gabapentin 300 MG capsule ?Commonly known as: NEURONTIN ?Take 900 mg by mouth 3 (three) times daily. ?  ?hydrochlorothiazide 12.5 MG tablet ?Commonly known as: HYDRODIURIL ?TAKE ONE TABLET BY MOUTH DAILY. NEED TO HAVE LABS TAKEN FOR FURTHER REFILLS. ?  ?HYDROcodone-acetaminophen 5-325 MG tablet ?Commonly known as: NORCO/VICODIN ?Take 1 tablet by mouth every 4 (four) hours as needed for moderate pain. ?  ?Narcan 4 MG/0.1ML Liqd nasal spray kit ?Generic drug: naloxone ?Place 1 spray into the nose once. ?  ?oxyCODONE-acetaminophen 10-325 MG tablet ?Commonly known as: PERCOCET ?Take by mouth. ?  ?pantoprazole 40 MG tablet ?Commonly known as: PROTONIX ?TAKE ONE TABLET BY MOUTH DAILY ?  ?potassium chloride SA 20 MEQ tablet ?Commonly known as: KLOR-CON M ?Take 2 tablets (40 mEq total) by mouth daily. ?What changed:  ?when to take this ?reasons to take this ?  ?triamcinolone cream 0.1 % ?Commonly known as: KENALOG ?Apply 1 application topically 2 (two) times daily. Combine with Eucerin over dry/scaly areas. ?What changed:  ?when to take this ?reasons to take this ?  ? ?  ? ? ?History (reviewed): ?Past Medical History:  ?Diagnosis Date  ? Anemia   ? Back disorder   ? Cellulitis of left lower leg 08/04/2017  ? Complication of anesthesia   ? was having a back surgery, prone position, patient vomitted, had to be intubated- no problems since that time.  ? Fibromyalgia   ? GERD (gastroesophageal reflux disease)   ? Heart murmur   ? History of hiatal hernia   ? Hypertension   ? Iron deficiency anemia   ? recieved IV iron.  ? Knee gives out   ? Other disorders of continuity of bone, right pelvic region and thigh   ? Stroke [facility])   ? 2 TIA's  ? ?Past Surgical History:  ?Procedure Laterality Date  ? ARTHRODESIS METATARSALPHALANGEAL JOINT (MTPJ) Right 08/03/2021  ? Procedure: FUSION RIGHT GREAT TOE  METATARSALPHALANGEAL JOINT (MTPJ), WEIL OSTEOTOMY 2ND METATARSAL, PROXIMAL INERPHALANGEAL RESECTION 2ND AND 3RD TOES;  Surgeon: Newt Minion, MD;  Location: [facility];  Service: Orthopedics;  Laterality: Right;  ? CHOLECYSTECTOMY    ? [place]  Right Carotid Right 2012  ? Dissecton with pseudoanevrysm  ? GASTROCNEMIUS RECESSION Right 08/03/2021  ? Procedure: RIGHT GASTROCNEMIUS RECESSION;  Surgeon: Newt Minion, MD;  Location: [facility];  Service: Orthopedics;  Laterality: Right;  ? LEG SURGERY Bilateral   ? numerous leg surgeries post accident age 28  ? SIGMOID RESECTION / RECTOPEXY    ? Traumatic amputation Right   ? 5th toe, ran over by a lawn mower.  was a child.  ? ?Family History  ?Problem Relation Age of Onset  ? Heart attack Father   ? Cancer Father   ?  Breast cancer Maternal Grandmother   ? ?Social History  ? ?Socioeconomic History  ? Marital status: Divorced  ?  Spouse name: Not on file  ? Number of children: 1  ? Years of education: 41  ? Highest education level: Associate degree: academic program  ?Occupational History  ? 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 Smoking status: Every Day  ?  Packs/day: 0.50  ?  Years: 40.00  ?  Pack years: 20.00  ?  Types: Cigarettes  ? Smokeless tobacco: Never  ?Vaping Use  ? Vaping Use: Never used  ?Substance and Sexual Activity  ? Alcohol use: No  ? Drug use: No  ? Sexual activity: Not Currently  ?Other Topics Concern  ? Not on file  ?Social History Narrative  ? Lives with her daughter. She enjoys reading.  ? ?Social Determinants of Health  ? ?Financial Resource Strain: Low Risk   ? Difficulty of Paying Living Expenses: Not hard at all  ?Food Insecurity: No Food Insecurity  ? Worried About Charity fundraiser in the Last Year: Never true  ? Ran Out of Food in the Last Year: Never true  ?Transportation Needs: No Transportation Needs  ? Lack of Transportation (Medical): No  ? Lack of Transportation (Non-Medical): No  ?Physical Activity: Insufficiently Active  ?  Days of Exercise per Week: 1 day  ? Minutes of Exercise per Session: 40 min  ?Stress: No Stress Concern Present  ? Feeling of Stress : Not at all  ?Social Connections: Moderately Isolated  ? Frequency of

## 2021-11-19 NOTE — Patient Instructions (Signed)
?MEDICARE ANNUAL WELLNESS VISIT ?Health Maintenance Summary and Written Plan of Care ? ?Ms. Aundra Dubin , ? ?Thank you for allowing me to perform your Medicare Annual Wellness Visit and for your ongoing commitment to your health.  ? ?Health Maintenance & Immunization History ?Health Maintenance  ?Topic Date Due  ? PAP SMEAR-Modifier  11/19/2021 (Originally 09/19/2021)  ? COVID-19 Vaccine (1) 12/05/2021 (Originally 04/30/1961)  ? Zoster Vaccines- Shingrix (1 of 2) 02/18/2022 (Originally 10/28/1980)  ? HIV Screening  11/20/2022 (Originally 10/28/1976)  ? COLONOSCOPY (Pts 45-37yr Insurance coverage will need to be confirmed)  01/16/2022  ? INFLUENZA VACCINE  03/05/2022  ? MAMMOGRAM  09/25/2022  ? TETANUS/TDAP  02/22/2024  ? Hepatitis C Screening  Completed  ? HPV VACCINES  Aged Out  ? ?Immunization History  ?Administered Date(s) Administered  ? Influenza Whole 05/02/2018  ? Influenza,inj,Quad PF,6+ Mos 04/04/2014, 05/15/2016, 04/28/2017, 03/30/2021  ? Influenza-Unspecified 05/01/2012, 05/15/2016  ? PPD Test 10/03/2009  ? Td 08/05/2008  ? Tdap 02/21/2014  ? ? ?These are the patient goals that we discussed: ? Goals Addressed   ? ?  ?  ?  ?  ?  ? This Visit's Progress  ?   Patient Stated (pt-stated)     ?   11/19/2021 ?AWV Goal: Exercise for General Health ? ?Patient will verbalize understanding of the benefits of increased physical activity: ?Exercising regularly is important. It will improve your overall fitness, flexibility, and endurance. ?Regular exercise also will improve your overall health. It can help you control your weight, reduce stress, and improve your bone density. ?Over the next year, patient will increase physical activity as tolerated with a goal of at least 150 minutes of moderate physical activity per week.  ?You can tell that you are exercising at a moderate intensity if your heart starts beating faster and you start breathing faster but can still hold a conversation. ?Moderate-intensity exercise ideas  include: ?Walking 1 mile (1.6 km) in about 15 minutes ?Biking ?Hiking ?Golfing ?Dancing ?Water aerobics ?Patient will verbalize understanding of everyday activities that increase physical activity by providing examples like the following: ?Yard work, such as: ?Pushing a lConservation officer, nature?Raking and bagging leaves ?Washing your car ?Pushing a stroller ?Shoveling snow ?Gardening ?Washing windows or floors ?Patient will be able to explain general safety guidelines for exercising:  ?Before you start a new exercise program, talk with your health care provider. ?Do not exercise so much that you hurt yourself, feel dizzy, or get very short of breath. ?Wear comfortable clothes and wear shoes with good support. ?Drink plenty of water while you exercise to prevent dehydration or heat stroke. ?Work out until your breathing and your heartbeat get faster.  ?  ? ?  ?  ? ?This is a list of Health Maintenance Items that are overdue or due now: ?Recommendations  ?Screening Pap smear and pelvic exam  ?Shingrix vaccine ?HIV screening ? ?Orders/Referrals Placed Today: ?No orders of the defined types were placed in this encounter. ? ?(Contact our referral department at [phone] you have not spoken with someone about your referral appointment within the next 5 days)  ? ? ?Follow-up Plan ?Follow-up with BDonella Stade PA-C as planned ?Schedule your Pap smear and your shingrix vaccine.  ?Medicare wellness visit in one year.  ?AVS printed and mailed to the patient. ? ? ? ?  ?Health Maintenance, Female ?Adopting a healthy lifestyle and getting preventive care are important in promoting health and wellness. Ask your health care provider about: ?The right schedule  for you to have regular tests and exams. ?Things you can do on your own to prevent diseases and keep yourself healthy. ?What should I know about diet, weight, and exercise? ?Eat a healthy diet ? ?Eat a diet that includes plenty of vegetables, fruits, low-fat dairy products, and  lean protein. ?Do not eat a lot of foods that are high in solid fats, added sugars, or sodium. ?Maintain a healthy weight ?Body mass index (BMI) is used to identify weight problems. It estimates body fat based on height and weight. Your health care provider can help determine your BMI and help you achieve or maintain a healthy weight. ?Get regular exercise ?Get regular exercise. This is one of the most important things you can do for your health. Most adults should: ?Exercise for at least 150 minutes each week. The exercise should increase your heart rate and make you sweat (moderate-intensity exercise). ?Do strengthening exercises at least twice a week. This is in addition to the moderate-intensity exercise. ?Spend less time sitting. Even light physical activity can be beneficial. ?Watch cholesterol and blood lipids ?Have your blood tested for lipids and cholesterol at 60 years of age, then have this test every 5 years. ?Have your cholesterol levels checked more often if: ?Your lipid or cholesterol levels are high. ?You are older than 60 years of age. ?You are at high risk for heart disease. ?What should I know about cancer screening? ?Depending on your health history and family history, you may need to have cancer screening at various ages. This may include screening for: ?Breast cancer. ?Cervical cancer. ?Colorectal cancer. ?Skin cancer. ?Lung cancer. ?What should I know about heart disease, diabetes, and high blood pressure? ?Blood pressure and heart disease ?High blood pressure causes heart disease and increases the risk of stroke. This is more likely to develop in people who have high blood pressure readings or are overweight. ?Have your blood pressure checked: ?Every 3-5 years if you are 14-6 years of age. ?Every year if you are 82 years old or older. ?Diabetes ?Have regular diabetes screenings. This checks your fasting blood sugar level. Have the screening done: ?Once every three years after age 65 if you  are at a normal weight and have a low risk for diabetes. ?More often and at a younger age if you are overweight or have a high risk for diabetes. ?What should I know about preventing infection? ?Hepatitis B ?If you have a higher risk for hepatitis B, you should be screened for this virus. Talk with your health care provider to find out if you are at risk for hepatitis B infection. ?Hepatitis C ?Testing is recommended for: ?Everyone born from [birth year] through 1964. ?Anyone with known risk factors for hepatitis C. ?Sexually transmitted infections (STIs) ?Get screened for STIs, including gonorrhea and chlamydia, if: ?You are sexually active and are younger than 60 years of age. ?You are older than 60 years of age and your health care provider tells you that you are at risk for this type of infection. ?Your sexual activity has changed since you were last screened, and you are at increased risk for chlamydia or gonorrhea. Ask your health care provider if you are at risk. ?Ask your health care provider about whether you are at high risk for HIV. Your health care provider may recommend a prescription medicine to help prevent HIV infection. If you choose to take medicine to prevent HIV, you should first get tested for HIV. You should then be tested every 3 months for  as long as you are taking the medicine. ?Pregnancy ?If you are about to stop having your period (premenopausal) and you may become pregnant, seek counseling before you get pregnant. ?Take 400 to 800 micrograms (mcg) of folic acid every day if you become pregnant. ?Ask for birth control (contraception) if you want to prevent pregnancy. ?Osteoporosis and menopause ?Osteoporosis is a disease in which the bones lose minerals and strength with aging. This can result in bone fractures. If you are 100 years old or older, or if you are at risk for osteoporosis and fractures, ask your health care provider if you should: ?Be screened for bone loss. ?Take a calcium or vitamin  D supplement to lower your risk of fractures. ?Be given hormone replacement therapy (HRT) to treat symptoms of menopause. ?Follow these instructions at home: ?Alcohol use ?Do not drink alcohol if: ?Your healt

## 2021-12-06 ENCOUNTER — Other Ambulatory Visit: Payer: Medicare HMO

## 2021-12-17 ENCOUNTER — Other Ambulatory Visit: Payer: Medicare HMO

## 2022-01-03 ENCOUNTER — Other Ambulatory Visit: Payer: Medicare HMO

## 2022-01-10 ENCOUNTER — Other Ambulatory Visit: Payer: Medicare HMO

## 2022-01-21 ENCOUNTER — Ambulatory Visit: Payer: Medicare HMO

## 2022-01-21 ENCOUNTER — Ambulatory Visit
Admission: RE | Admit: 2022-01-21 | Discharge: 2022-01-21 | Disposition: A | Discharge status: Home / Self Care | Payer: Medicare HMO | Source: Ambulatory Visit | Attending: Physician Assistant | Admitting: Physician Assistant

## 2022-01-21 DIAGNOSIS — N6001 Solitary cyst of right breast: Secondary | ICD-10-CM

## 2022-01-22 NOTE — Progress Notes (Signed)
GREAT news. Right benign breast cyst. Return to annual screenings.

## 2022-01-30 ENCOUNTER — Other Ambulatory Visit: Payer: Self-pay | Admitting: Physician Assistant

## 2022-01-30 DIAGNOSIS — E782 Mixed hyperlipidemia: Secondary | ICD-10-CM

## 2022-03-06 DIAGNOSIS — F1120 Opioid dependence, uncomplicated: Secondary | ICD-10-CM | POA: Diagnosis not present

## 2022-03-06 DIAGNOSIS — Z6828 Body mass index (BMI) 28.0-28.9, adult: Secondary | ICD-10-CM | POA: Diagnosis not present

## 2022-03-06 DIAGNOSIS — F17210 Nicotine dependence, cigarettes, uncomplicated: Secondary | ICD-10-CM | POA: Diagnosis not present

## 2022-03-06 DIAGNOSIS — M5416 Radiculopathy, lumbar region: Secondary | ICD-10-CM | POA: Diagnosis not present

## 2022-03-06 DIAGNOSIS — R825 Elevated urine levels of drugs, medicaments and biological substances: Secondary | ICD-10-CM | POA: Diagnosis not present

## 2022-03-06 DIAGNOSIS — Z79891 Long term (current) use of opiate analgesic: Secondary | ICD-10-CM | POA: Diagnosis not present

## 2022-03-06 DIAGNOSIS — G8929 Other chronic pain: Secondary | ICD-10-CM | POA: Diagnosis not present

## 2022-03-06 DIAGNOSIS — Z79899 Other long term (current) drug therapy: Secondary | ICD-10-CM | POA: Diagnosis not present

## 2022-03-08 DIAGNOSIS — Z79899 Other long term (current) drug therapy: Secondary | ICD-10-CM | POA: Diagnosis not present

## 2022-04-03 DIAGNOSIS — F17210 Nicotine dependence, cigarettes, uncomplicated: Secondary | ICD-10-CM | POA: Diagnosis not present

## 2022-04-03 DIAGNOSIS — Z6828 Body mass index (BMI) 28.0-28.9, adult: Secondary | ICD-10-CM | POA: Diagnosis not present

## 2022-04-03 DIAGNOSIS — R825 Elevated urine levels of drugs, medicaments and biological substances: Secondary | ICD-10-CM | POA: Diagnosis not present

## 2022-04-03 DIAGNOSIS — Z79899 Other long term (current) drug therapy: Secondary | ICD-10-CM | POA: Diagnosis not present

## 2022-04-03 DIAGNOSIS — M5416 Radiculopathy, lumbar region: Secondary | ICD-10-CM | POA: Diagnosis not present

## 2022-04-03 DIAGNOSIS — Z79891 Long term (current) use of opiate analgesic: Secondary | ICD-10-CM | POA: Diagnosis not present

## 2022-04-03 DIAGNOSIS — F1120 Opioid dependence, uncomplicated: Secondary | ICD-10-CM | POA: Diagnosis not present

## 2022-04-03 DIAGNOSIS — G8929 Other chronic pain: Secondary | ICD-10-CM | POA: Diagnosis not present

## 2022-04-05 DIAGNOSIS — Z79899 Other long term (current) drug therapy: Secondary | ICD-10-CM | POA: Diagnosis not present

## 2022-05-08 DIAGNOSIS — Z79891 Long term (current) use of opiate analgesic: Secondary | ICD-10-CM | POA: Diagnosis not present

## 2022-05-08 DIAGNOSIS — F1120 Opioid dependence, uncomplicated: Secondary | ICD-10-CM | POA: Diagnosis not present

## 2022-05-08 DIAGNOSIS — M5416 Radiculopathy, lumbar region: Secondary | ICD-10-CM | POA: Diagnosis not present

## 2022-05-08 DIAGNOSIS — R825 Elevated urine levels of drugs, medicaments and biological substances: Secondary | ICD-10-CM | POA: Diagnosis not present

## 2022-05-08 DIAGNOSIS — G8929 Other chronic pain: Secondary | ICD-10-CM | POA: Diagnosis not present

## 2022-05-08 DIAGNOSIS — F17210 Nicotine dependence, cigarettes, uncomplicated: Secondary | ICD-10-CM | POA: Diagnosis not present

## 2022-05-08 DIAGNOSIS — Z6828 Body mass index (BMI) 28.0-28.9, adult: Secondary | ICD-10-CM | POA: Diagnosis not present

## 2022-05-08 DIAGNOSIS — Z79899 Other long term (current) drug therapy: Secondary | ICD-10-CM | POA: Diagnosis not present

## 2022-05-20 DIAGNOSIS — H52223 Regular astigmatism, bilateral: Secondary | ICD-10-CM | POA: Diagnosis not present

## 2022-06-11 ENCOUNTER — Encounter: Payer: Self-pay | Admitting: Physician Assistant

## 2022-06-11 ENCOUNTER — Ambulatory Visit (INDEPENDENT_AMBULATORY_CARE_PROVIDER_SITE_OTHER): Payer: Medicare HMO | Admitting: Physician Assistant

## 2022-06-11 VITALS — BP 141/95 | HR 81 | Ht 69.0 in | Wt 167.1 lb

## 2022-06-11 DIAGNOSIS — J441 Chronic obstructive pulmonary disease with (acute) exacerbation: Secondary | ICD-10-CM

## 2022-06-11 DIAGNOSIS — R5383 Other fatigue: Secondary | ICD-10-CM | POA: Diagnosis not present

## 2022-06-11 DIAGNOSIS — J431 Panlobular emphysema: Secondary | ICD-10-CM

## 2022-06-11 MED ORDER — ALBUTEROL SULFATE HFA 108 (90 BASE) MCG/ACT IN AERS: 2.0000 | INHALATION_SPRAY | RESPIRATORY_TRACT | 1 refills | Status: AC | PRN

## 2022-06-11 MED ORDER — PREDNISONE 50 MG PO TABS: ORAL_TABLET | ORAL | 0 refills | Status: DC

## 2022-06-11 MED ORDER — DOXYCYCLINE HYCLATE 100 MG PO TABS: 100.0000 mg | ORAL_TABLET | Freq: Two times a day (BID) | ORAL | 0 refills | Status: DC

## 2022-06-11 NOTE — Progress Notes (Unsigned)
Acute Office Visit  Subjective:     Patient ID: Sophia Smith, female    DOB: Oct 22, 1960, 61 y.o.   MRN: [id number]  Chief Complaint  Patient presents with   Sinus Problem    HPI Patient is in today for cough, congestion, SOB, chest tightness and sinus pressure. She has known emphysema. She has been feeling bad for the last 5 days. Her symptoms continue to worsen. Albuterol is only helping minimally. She continues to smoke. SOB is worse at night and when she lays flat. She is very easily winded. No fever, chills, body aches.   She is concerned about her fatigue level overall. Her father died in 03-24-23 and she feels like she has not gotten her strength back since she was so busy with his life and death.   .. Active Ambulatory Problems    Diagnosis Date Noted   Lymphedema of leg 09/13/2013   Pneumothorax on left 09/13/2013   Left rib fracture 09/13/2013   Fibromyalgia 09/13/2013   Lumbar disc disease 09/13/2013   Aneurysm artery, neck ([place]) 09/15/2013   Pelvic fracture (HCC) 09/15/2013   Depression 09/15/2013   Elevated alkaline phosphatase level 10/15/2013   Bilateral lower extremity edema 10/26/2013   Left knee pain 10/26/2013   Tricompartment degenerative joint disease of knee 10/26/2013   Plantar fasciitis, bilateral 03/21/2014   S/P total knee replacement 09/13/2014   Left ankle pain 09/13/2014   Insomnia secondary to chronic pain 06/01/2015   Upper back pain on right side 06/01/2015   Internal hemorrhoid 12/05/2015   Iron deficiency anemia 05/13/2017   Drug-seeking behavior 09/19/2017   Essential hypertension 09/19/2017   Opioid dependence with withdrawal ([place]) 09/19/2017   Chronic bilateral low back pain with bilateral sciatica 09/19/2017   Cigarette nicotine dependence without complication 37/16/9678   Lumbar spondylolysis 09/23/2017   Mitral regurgitation 10/22/2017   Tricuspid regurgitation 10/22/2017   Left atrial enlargement 10/22/2017   Bilateral  cataracts 11/04/2017   Disorder of refraction 11/04/2017   Thumb pain, left 03/22/2018   Extensor carpi radialis brevis tenosynovitis 03/22/2018   Non-intractable vomiting with nausea 03/22/2018   Hiatal hernia 03/22/2018   Chronic venous stasis 05/14/2018   Plantar callus 08/13/2018   Vertebral artery dissection ([place]) 09/01/2018   Tobacco abuse 09/01/2018   Dyslipidemia (high LDL; low HDL) 03/03/2019   Normocytic anemia 01/14/2020   Hypokalemia 01/14/2020   Aortic atherosclerosis ([place]) 01/24/2020   Rib pain on left side 01/24/2020   Panlobular emphysema ([place]) 01/25/2020   Pulmonary nodules 01/25/2020   Status post left knee replacement 03/22/2020   Fall 03/27/2020   Current every day smoker 03/27/2020   Bunion of great toe of right foot 04/18/2020   Centrilobular emphysema ([place]) 07/31/2020   Venous stasis dermatitis of both lower extremities 09/05/2020   Edema of left ankle 09/05/2020   PCO (posterior capsular opacification) 09/28/2020   Contracture of right Achilles tendon    Hallux rigidus, right foot    Acquired claw toe of right foot    COPD exacerbation ([place]) 06/11/2022   No energy 06/11/2022   Resolved Ambulatory Problems    Diagnosis Date Noted   Acute stress reaction 06/01/2015   Seroma, post-traumatic ([place]) 05/13/2017   Cellulitis of left lower leg 08/04/2017   Wound of left leg 08/04/2017   Past Medical History:  Diagnosis Date   Anemia    Back disorder    Complication of anesthesia    GERD (gastroesophageal reflux disease)    Heart murmur  History of hiatal hernia    Hypertension    Knee gives out    Other disorders of continuity of bone, right pelvic region and thigh    Stroke ([place])      ROS  See HPI.     Objective:    BP (!) 141/95   Pulse 81   Ht '5\' 9"'$  (1.753 m)   Wt 167 lb 1.6 oz (75.8 kg)   LMP 12/11/2012   SpO2 96%   BMI 24.68 kg/m  BP Readings from Last 3 Encounters:  06/11/22 (!) 141/95  08/03/21 (!) 144/85  07/10/21 (!)  136/99   Wt Readings from Last 3 Encounters:  06/11/22 167 lb 1.6 oz (75.8 kg)  08/02/21 158 lb (71.7 kg)  07/10/21 158 lb (71.7 kg)    .Marland Kitchen    06/11/2022    1:27 PM 11/19/2021    2:24 PM 03/22/2020   10:29 AM 09/06/2019   11:38 AM 06/09/2019    1:49 PM  Depression screen PHQ 2/9  Decreased Interest 1 0 '1 1 3  '$ Down, Depressed, Hopeless 0 0 0 0 1  PHQ - 2 Score 1 0 '1 1 4  '$ Altered sleeping   0 3 3  Tired, decreased energy   '1 1 3  '$ Change in appetite   0 1 0  Feeling bad or failure about yourself    0 0 0  Trouble concentrating   0 0 0  Moving slowly or fidgety/restless   0 0 0  Suicidal thoughts   0 0 0  PHQ-9 Score   '2 6 10  '$ Difficult doing work/chores   Somewhat difficult Somewhat difficult Very difficult        Physical Exam Constitutional:      Appearance: Normal appearance.  HENT:     Head: Normocephalic.     Nose: Congestion present.     Mouth/Throat:     Mouth: Mucous membranes are moist.     Pharynx: Posterior oropharyngeal erythema present. No oropharyngeal exudate.  Eyes:     Conjunctiva/sclera: Conjunctivae normal.  Neck:     Vascular: No carotid bruit.  Cardiovascular:     Rate and Rhythm: Normal rate and regular rhythm.     Pulses: Normal pulses.  Pulmonary:     Effort: Pulmonary effort is normal.     Breath sounds: Wheezing and rhonchi present.     Comments: Coarse breath sounds with scattered wheezing and rhonchi in both lungs.  Musculoskeletal:     Cervical back: Normal range of motion and neck supple. No tenderness.     Right lower leg: No edema.     Left lower leg: No edema.  Lymphadenopathy:     Cervical: No cervical adenopathy.  Neurological:     General: No focal deficit present.     Mental Status: She is alert and oriented to person, place, and time.  Psychiatric:        Mood and Affect: Mood normal.    Duoneb nebulizer treatment given in office today pt did have improvement with pulse ox staying the same at 96 percent.      Assessment  & Plan:  Marland KitchenMarland KitchenYeila was seen today for sinus problem.  Diagnoses and all orders for this visit:  COPD exacerbation ([place]) -     doxycycline (VIBRA-TABS) 100 MG tablet; Take 1 tablet (100 mg total) by mouth 2 (two) times daily. For 10 days. -     predniSONE (DELTASONE) 50 MG tablet; Take one tablet daily. -  albuterol (VENTOLIN HFA) 108 (90 Base) MCG/ACT inhaler; Inhale 2 puffs into the lungs every 4 (four) hours as needed for wheezing or shortness of breath.  Panlobular emphysema (HCC) -     predniSONE (DELTASONE) 50 MG tablet; Take one tablet daily. -     albuterol (VENTOLIN HFA) 108 (90 Base) MCG/ACT inhaler; Inhale 2 puffs into the lungs every 4 (four) hours as needed for wheezing or shortness of breath.  No energy    Treated with doxycycline, prednisone and albuterol.  Rest and hydrate.  If not improving follow up in office At some point need to consider daily inhaler to help with daily symptoms  Discussed fatigue and numerous causes after you feel better from infection lets get labs to check cmp, cbc Certainly some fatigue could be coming from depression. Consider counseling.  No follow-ups on file.  Iran Planas, PA-C

## 2022-06-11 NOTE — Patient Instructions (Signed)
Chronic Obstructive Pulmonary Disease  Chronic obstructive pulmonary disease (COPD) is a long-term (chronic) lung problem. When you have COPD, it is hard for air to get in and out of your lungs. Usually the condition gets worse over time, and your lungs will never return to normal. There are things you can do to keep yourself as healthy as possible. What are the causes? Smoking. This is the most common cause. Certain genes passed from parent to child (inherited). What increases the risk? Being exposed to secondhand smoke from cigarettes, pipes, or cigars. Being exposed to chemicals and other irritants, such as fumes and dust in the work environment. Having chronic lung conditions or infections. What are the signs or symptoms? Shortness of breath, especially during physical activity. A long-term cough with a large amount of thick mucus. Sometimes, the cough may not have any mucus (dry cough). Wheezing. Breathing quickly. Skin that looks gray or blue, especially in the fingers, toes, or lips. Feeling tired (fatigue). Weight loss. Chest tightness. Having infections often. Episodes when breathing symptoms become much worse (exacerbations). At the later stages of this disease, you may have swelling in the ankles, feet, or legs. How is this treated? Taking medicines. Quitting smoking, if you smoke. Rehabilitation. This includes steps to make your body work better. It may involve a team of specialists. Doing exercises. Making changes to your diet. Using oxygen. Lung surgery. Lung transplant. Comfort measures (palliative care). Follow these instructions at home: Medicines Take over-the-counter and prescription medicines only as told by your doctor. Talk to your doctor before taking any cough or allergy medicines. You may need to avoid medicines that cause your lungs to be dry. Lifestyle If you smoke, stop smoking. Smoking makes the problem worse. Do not smoke or use any products that  contain nicotine or tobacco. If you need help quitting, ask your doctor. Avoid being around things that make your breathing worse. This may include smoke, chemicals, and fumes. Stay active, but remember to rest as well. Learn and use tips on how to manage stress and control your breathing. Make sure you get enough sleep. Most adults need at least 7 hours of sleep every night. Eat healthy foods. Eat smaller meals more often. Rest before meals. Controlled breathing Learn and use tips on how to control your breathing as told by your doctor. Try: Breathing in (inhaling) through your nose for 1 second. Then, pucker your lips and breath out (exhale) through your lips for 2 seconds. Putting one hand on your belly (abdomen). Breathe in slowly through your nose for 1 second. Your hand on your belly should move out. Pucker your lips and breathe out slowly through your lips. Your hand on your belly should move in as you breathe out.  Controlled coughing Learn and use controlled coughing to clear mucus from your lungs. Follow these steps: Lean your head a little forward. Breathe in deeply. Try to hold your breath for 3 seconds. Keep your mouth slightly open while coughing 2 times. Spit any mucus out into a tissue. Rest and do the steps again 1 or 2 times as needed. General instructions Make sure you get all the shots (vaccines) that your doctor recommends. Ask your doctor about a flu shot and a pneumonia shot. Use oxygen therapy and pulmonary rehabilitation if told by your doctor. If you need home oxygen therapy, ask your doctor if you should buy a tool to measure your oxygen level (oximeter). Make a COPD action plan with your doctor. This helps you   to know what to do if you feel worse than usual. Manage any other conditions you have as told by your doctor. Avoid going outside when it is very hot, cold, or humid. Avoid people who have a sickness you can catch (contagious). Keep all follow-up  visits. Contact a doctor if: You cough up more mucus than usual. There is a change in the color or thickness of the mucus. It is harder to breathe than usual. Your breathing is faster than usual. You have trouble sleeping. You need to use your medicines more often than usual. You have trouble doing your normal activities such as getting dressed or walking around the house. Get help right away if: You have shortness of breath while resting. You have shortness of breath that stops you from: Being able to talk. Doing normal activities. Your chest hurts for longer than 5 minutes. Your skin color is more blue than usual. Your pulse oximeter shows that you have low oxygen for longer than 5 minutes. You have a fever. You feel too tired to breathe normally. These symptoms may represent a serious problem that is an emergency. Do not wait to see if the symptoms will go away. Get medical help right away. Call your local emergency services (911 in the [place] Do not drive yourself to the hospital. Summary Chronic obstructive pulmonary disease (COPD) is a long-term lung problem. The way your lungs work will never return to normal. Usually the condition gets worse over time. There are things you can do to keep yourself as healthy as possible. Take over-the-counter and prescription medicines only as told by your doctor. If you smoke, stop. Smoking makes the problem worse. This information is not intended to replace advice given to you by your health care provider. Make sure you discuss any questions you have with your health care provider. Document Revised: 05/30/2020 Document Reviewed: 05/30/2020 Elsevier Patient Education  2023 Elsevier Inc.  

## 2022-06-12 DIAGNOSIS — Z6827 Body mass index (BMI) 27.0-27.9, adult: Secondary | ICD-10-CM | POA: Diagnosis not present

## 2022-06-12 DIAGNOSIS — R825 Elevated urine levels of drugs, medicaments and biological substances: Secondary | ICD-10-CM | POA: Diagnosis not present

## 2022-06-12 DIAGNOSIS — F17210 Nicotine dependence, cigarettes, uncomplicated: Secondary | ICD-10-CM | POA: Diagnosis not present

## 2022-06-12 DIAGNOSIS — M5416 Radiculopathy, lumbar region: Secondary | ICD-10-CM | POA: Diagnosis not present

## 2022-06-12 DIAGNOSIS — Z79899 Other long term (current) drug therapy: Secondary | ICD-10-CM | POA: Diagnosis not present

## 2022-06-12 DIAGNOSIS — F1120 Opioid dependence, uncomplicated: Secondary | ICD-10-CM | POA: Diagnosis not present

## 2022-06-12 DIAGNOSIS — Z79891 Long term (current) use of opiate analgesic: Secondary | ICD-10-CM | POA: Diagnosis not present

## 2022-06-12 DIAGNOSIS — G8929 Other chronic pain: Secondary | ICD-10-CM | POA: Diagnosis not present

## 2022-06-14 DIAGNOSIS — Z79899 Other long term (current) drug therapy: Secondary | ICD-10-CM | POA: Diagnosis not present

## 2022-06-25 ENCOUNTER — Telehealth: Payer: Self-pay | Admitting: Neurology

## 2022-06-25 DIAGNOSIS — M797 Fibromyalgia: Secondary | ICD-10-CM

## 2022-06-25 MED ORDER — CYCLOBENZAPRINE HCL 10 MG PO TABS: 10.0000 mg | ORAL_TABLET | Freq: Every day | ORAL | 2 refills | Status: AC

## 2022-06-25 MED ORDER — AZITHROMYCIN 250 MG PO TABS: ORAL_TABLET | ORAL | 0 refills | Status: DC

## 2022-06-25 NOTE — Addendum Note (Signed)
Addended byAnnamaria Helling on: 06/25/2022 04:59 PM   Modules accepted: Orders

## 2022-06-25 NOTE — Telephone Encounter (Signed)
Patient LVM requesting another round or a different antibiotic.   She was seen on 06/11/2022 for COPD exacerbation, 3 days after starting antibiotic she had 3 days of vomiting and wasn't able to complete the course. Still having coughing/cold symptoms.   Please advise.

## 2022-06-25 NOTE — Telephone Encounter (Signed)
Patient made aware.

## 2022-06-29 DIAGNOSIS — R825 Elevated urine levels of drugs, medicaments and biological substances: Secondary | ICD-10-CM | POA: Diagnosis not present

## 2022-06-29 DIAGNOSIS — Z6829 Body mass index (BMI) 29.0-29.9, adult: Secondary | ICD-10-CM | POA: Diagnosis not present

## 2022-06-29 DIAGNOSIS — Z79899 Other long term (current) drug therapy: Secondary | ICD-10-CM | POA: Diagnosis not present

## 2022-06-29 DIAGNOSIS — Z79891 Long term (current) use of opiate analgesic: Secondary | ICD-10-CM | POA: Diagnosis not present

## 2022-06-29 DIAGNOSIS — F17210 Nicotine dependence, cigarettes, uncomplicated: Secondary | ICD-10-CM | POA: Diagnosis not present

## 2022-06-29 DIAGNOSIS — G8929 Other chronic pain: Secondary | ICD-10-CM | POA: Diagnosis not present

## 2022-06-29 DIAGNOSIS — F1120 Opioid dependence, uncomplicated: Secondary | ICD-10-CM | POA: Diagnosis not present

## 2022-06-29 DIAGNOSIS — M5416 Radiculopathy, lumbar region: Secondary | ICD-10-CM | POA: Diagnosis not present

## 2022-07-02 DIAGNOSIS — D124 Benign neoplasm of descending colon: Secondary | ICD-10-CM | POA: Diagnosis not present

## 2022-07-02 DIAGNOSIS — Z86010 Personal history of colonic polyps: Secondary | ICD-10-CM | POA: Diagnosis not present

## 2022-07-02 DIAGNOSIS — K635 Polyp of colon: Secondary | ICD-10-CM | POA: Diagnosis not present

## 2022-07-02 DIAGNOSIS — D126 Benign neoplasm of colon, unspecified: Secondary | ICD-10-CM | POA: Diagnosis not present

## 2022-07-02 DIAGNOSIS — Z1211 Encounter for screening for malignant neoplasm of colon: Secondary | ICD-10-CM | POA: Diagnosis not present

## 2022-07-02 DIAGNOSIS — Z09 Encounter for follow-up examination after completed treatment for conditions other than malignant neoplasm: Secondary | ICD-10-CM | POA: Diagnosis not present

## 2022-07-02 LAB — HM COLONOSCOPY

## 2022-07-18 DIAGNOSIS — H52223 Regular astigmatism, bilateral: Secondary | ICD-10-CM | POA: Diagnosis not present

## 2022-07-18 DIAGNOSIS — H524 Presbyopia: Secondary | ICD-10-CM | POA: Diagnosis not present

## 2022-07-31 ENCOUNTER — Encounter: Payer: Self-pay | Admitting: Physician Assistant

## 2022-08-03 DIAGNOSIS — R825 Elevated urine levels of drugs, medicaments and biological substances: Secondary | ICD-10-CM | POA: Diagnosis not present

## 2022-08-03 DIAGNOSIS — M5416 Radiculopathy, lumbar region: Secondary | ICD-10-CM | POA: Diagnosis not present

## 2022-08-03 DIAGNOSIS — Z6828 Body mass index (BMI) 28.0-28.9, adult: Secondary | ICD-10-CM | POA: Diagnosis not present

## 2022-08-03 DIAGNOSIS — Z79891 Long term (current) use of opiate analgesic: Secondary | ICD-10-CM | POA: Diagnosis not present

## 2022-08-03 DIAGNOSIS — F1120 Opioid dependence, uncomplicated: Secondary | ICD-10-CM | POA: Diagnosis not present

## 2022-08-03 DIAGNOSIS — F17210 Nicotine dependence, cigarettes, uncomplicated: Secondary | ICD-10-CM | POA: Diagnosis not present

## 2022-08-03 DIAGNOSIS — G8929 Other chronic pain: Secondary | ICD-10-CM | POA: Diagnosis not present

## 2022-08-03 DIAGNOSIS — Z79899 Other long term (current) drug therapy: Secondary | ICD-10-CM | POA: Diagnosis not present

## 2022-08-07 DIAGNOSIS — Z79899 Other long term (current) drug therapy: Secondary | ICD-10-CM | POA: Diagnosis not present

## 2022-08-30 DIAGNOSIS — Z79891 Long term (current) use of opiate analgesic: Secondary | ICD-10-CM | POA: Diagnosis not present

## 2022-08-30 DIAGNOSIS — R825 Elevated urine levels of drugs, medicaments and biological substances: Secondary | ICD-10-CM | POA: Diagnosis not present

## 2022-08-30 DIAGNOSIS — M5416 Radiculopathy, lumbar region: Secondary | ICD-10-CM | POA: Diagnosis not present

## 2022-08-30 DIAGNOSIS — G8929 Other chronic pain: Secondary | ICD-10-CM | POA: Diagnosis not present

## 2022-08-30 DIAGNOSIS — Z79899 Other long term (current) drug therapy: Secondary | ICD-10-CM | POA: Diagnosis not present

## 2022-08-30 DIAGNOSIS — F17210 Nicotine dependence, cigarettes, uncomplicated: Secondary | ICD-10-CM | POA: Diagnosis not present

## 2022-08-30 DIAGNOSIS — F1120 Opioid dependence, uncomplicated: Secondary | ICD-10-CM | POA: Diagnosis not present

## 2022-08-30 DIAGNOSIS — Z6828 Body mass index (BMI) 28.0-28.9, adult: Secondary | ICD-10-CM | POA: Diagnosis not present

## 2022-09-03 DIAGNOSIS — Z79899 Other long term (current) drug therapy: Secondary | ICD-10-CM | POA: Diagnosis not present

## 2022-09-19 ENCOUNTER — Ambulatory Visit: Payer: Medicare HMO | Admitting: Sports Medicine

## 2022-09-19 ENCOUNTER — Ambulatory Visit (INDEPENDENT_AMBULATORY_CARE_PROVIDER_SITE_OTHER): Payer: Medicare HMO | Admitting: Medical-Surgical

## 2022-09-19 ENCOUNTER — Encounter: Payer: Self-pay | Admitting: Medical-Surgical

## 2022-09-19 VITALS — BP 112/74 | HR 89 | Resp 20 | Ht 69.0 in | Wt 174.5 lb

## 2022-09-19 DIAGNOSIS — R519 Headache, unspecified: Secondary | ICD-10-CM | POA: Diagnosis not present

## 2022-09-19 DIAGNOSIS — J441 Chronic obstructive pulmonary disease with (acute) exacerbation: Secondary | ICD-10-CM | POA: Diagnosis not present

## 2022-09-19 DIAGNOSIS — R5383 Other fatigue: Secondary | ICD-10-CM

## 2022-09-19 DIAGNOSIS — R051 Acute cough: Secondary | ICD-10-CM

## 2022-09-19 LAB — POC COVID19 BINAXNOW: SARS Coronavirus 2 Ag: NEGATIVE

## 2022-09-19 LAB — POCT INFLUENZA A/B
Influenza A, POC: NEGATIVE
Influenza B, POC: NEGATIVE

## 2022-09-19 MED ORDER — DOXYCYCLINE HYCLATE 100 MG PO TABS: 100.0000 mg | ORAL_TABLET | Freq: Two times a day (BID) | ORAL | 0 refills | Status: AC

## 2022-09-19 MED ORDER — PREDNISONE 50 MG PO TABS: 50.0000 mg | ORAL_TABLET | Freq: Every day | ORAL | 0 refills | Status: DC

## 2022-09-19 NOTE — Progress Notes (Signed)
Established Patient Office Visit  Subjective   Patient ID: AZELIE GUSTAVESON, female   DOB: 12-Nov-1960 Age: 62 y.o. MRN: [id number]   Chief Complaint  Patient presents with   Shortness of Breath   Cough   Headache    HPI Pleasant 62 year old female presenting today for evaluation of respiratory symptoms that started a few days ago.  Notes that she started having chest tightness as well as worsening of a nonproductive cough earlier this week.  Yesterday she developed a severe headache and notes that it is still present today however not quite as bad.  Has had no fever, chills, chest pain, nausea, vomiting, or diarrhea.  Notes that her girlfriend was in the area recently and she spent time visiting with her.  She left this past weekend but called yesterday to report she had tested positive for COVID.  Has not tried any over-the-counter measures for her symptoms. Uses an albuterol inhaler as needed.   Reports that it has been quite a while since she had labs checked and she has a history of iron deficiency.  Lately has been noticing a worsening of fatigue and is interested in getting her labs checked.  Objective:    Vitals:   09/19/22 1629  BP: 112/74  Pulse: 89  Resp: 20  Height: 5' 9"$  (1.753 m)  Weight: 174 lb 8 oz (79.2 kg)  SpO2: 95%  BMI (Calculated): 25.76    Physical Exam Vitals reviewed.  Constitutional:      General: She is not in acute distress.    Appearance: Normal appearance. She is not ill-appearing.  HENT:     Head: Normocephalic and atraumatic.  Cardiovascular:     Rate and Rhythm: Normal rate and regular rhythm.     Pulses: Normal pulses.     Heart sounds: Normal heart sounds.  Pulmonary:     Effort: Pulmonary effort is normal. No respiratory distress.     Breath sounds: Examination of the right-lower field reveals rhonchi. Examination of the left-lower field reveals rhonchi. Wheezing (Diffuse expiratory) and rhonchi present. No rales.  Skin:    General:  Skin is warm and dry.  Neurological:     Mental Status: She is alert and oriented to person, place, and time.  Psychiatric:        Mood and Affect: Mood normal.        Behavior: Behavior normal.        Thought Content: Thought content normal.        Judgment: Judgment normal.    Results for orders placed or performed in visit on 09/19/22 (from the past 24 hour(s))  POC COVID-19     Status: None   Collection Time: 09/19/22  4:36 PM  Result Value Ref Range   SARS Coronavirus 2 Ag Negative Negative  POCT Influenza A/B     Status: None   Collection Time: 09/19/22  4:36 PM  Result Value Ref Range   Influenza A, POC Negative Negative   Influenza B, POC Negative Negative       The ASCVD Risk score (Arnett DK, et al., 2019) failed to calculate for the following reasons:   The valid total cholesterol range is 130 to 320 mg/dL   Assessment & Plan:   1. Acute cough 2. Acute nonintractable headache, unspecified headache type POCT COVID and influenza negative.  Recommend she test in the next 1 to 2 days for COVID at home and let us know if this occurs.  At that time,  if in the 5-day window and positive, we can look to start molnupiravir or Paxlovid - POC COVID-19 - POCT Influenza A/B  3. COPD exacerbation ([place]) Although her symptoms likely started as a virus, she does have COPD and has had a worsening of shortness of breath and cough.  We will go ahead and treat her for COPD exacerbation with prednisone 50 mg daily x 5 days and doxycycline twice daily x 7 days.  Continue albuterol inhaler as needed.  4. No energy CBC and iron panel ordered today for further evaluation. - CBC with Differential/Platelet - Iron, TIBC and Ferritin Panel   Return if symptoms worsen or fail to improve.  ___________________________________________ Clearnce Sorrel, DNP, APRN, FNP-BC [facility]

## 2022-10-14 ENCOUNTER — Ambulatory Visit (INDEPENDENT_AMBULATORY_CARE_PROVIDER_SITE_OTHER): Payer: Medicare HMO

## 2022-10-14 DIAGNOSIS — F17210 Nicotine dependence, cigarettes, uncomplicated: Secondary | ICD-10-CM

## 2022-10-14 DIAGNOSIS — D649 Anemia, unspecified: Secondary | ICD-10-CM | POA: Diagnosis not present

## 2022-10-14 DIAGNOSIS — Z87891 Personal history of nicotine dependence: Secondary | ICD-10-CM

## 2022-10-14 DIAGNOSIS — R5383 Other fatigue: Secondary | ICD-10-CM | POA: Diagnosis not present

## 2022-10-15 ENCOUNTER — Telehealth: Payer: Self-pay | Admitting: Neurology

## 2022-10-15 DIAGNOSIS — D509 Iron deficiency anemia, unspecified: Secondary | ICD-10-CM

## 2022-10-15 LAB — IRON,TIBC AND FERRITIN PANEL
%SAT: 18 % (calc) (ref 16–45)
Ferritin: 9 ng/mL — ABNORMAL LOW (ref 16–232)
Iron: 75 ug/dL (ref 45–160)
TIBC: 409 mcg/dL (calc) (ref 250–450)

## 2022-10-15 LAB — CBC WITH DIFFERENTIAL/PLATELET
Absolute Monocytes: 644 cells/uL (ref 200–950)
Basophils Absolute: 91 cells/uL (ref 0–200)
Basophils Relative: 1.3 %
Eosinophils Absolute: 133 cells/uL (ref 15–500)
Eosinophils Relative: 1.9 %
HCT: 43.7 % (ref 35.0–45.0)
Hemoglobin: 14.5 g/dL (ref 11.7–15.5)
Lymphs Abs: 2296 cells/uL (ref 850–3900)
MCH: 29.1 pg (ref 27.0–33.0)
MCHC: 33.2 g/dL (ref 32.0–36.0)
MCV: 87.8 fL (ref 80.0–100.0)
MPV: 9.8 fL (ref 7.5–12.5)
Monocytes Relative: 9.2 %
Neutro Abs: 3836 cells/uL (ref 1500–7800)
Neutrophils Relative %: 54.8 %
Platelets: 293 10*3/uL (ref 140–400)
RBC: 4.98 10*6/uL (ref 3.80–5.10)
RDW: 12.7 % (ref 11.0–15.0)
Total Lymphocyte: 32.8 %
WBC: 7 10*3/uL (ref 3.8–10.8)

## 2022-10-15 NOTE — Telephone Encounter (Signed)
Patient called back and LVM about labs results below:   Sophia Smith, CMA 10/15/2022 11:14 AM EDT Back to Top    I spoke with the pt to give lab result message below. Pt voiced understanding and agrees with the plan to pick up OTC iron supplement.   Sophia Bouche, NP 10/15/2022  7:29 AM EDT     No issues with anemia however ferritin is very low at 9.  Recommend daily oral iron supplementation to help increase iron stores and combat fatigue.   She states she is unable to take oral iron and had to have an infusion last year. She is asking for next steps.

## 2022-10-16 NOTE — Telephone Encounter (Signed)
Spoke with patient and she is okay with referral. Referral placed.

## 2022-10-18 DIAGNOSIS — Z6829 Body mass index (BMI) 29.0-29.9, adult: Secondary | ICD-10-CM | POA: Diagnosis not present

## 2022-10-18 DIAGNOSIS — Z79891 Long term (current) use of opiate analgesic: Secondary | ICD-10-CM | POA: Diagnosis not present

## 2022-10-18 DIAGNOSIS — Z79899 Other long term (current) drug therapy: Secondary | ICD-10-CM | POA: Diagnosis not present

## 2022-10-18 DIAGNOSIS — M5136 Other intervertebral disc degeneration, lumbar region: Secondary | ICD-10-CM | POA: Diagnosis not present

## 2022-10-18 DIAGNOSIS — R030 Elevated blood-pressure reading, without diagnosis of hypertension: Secondary | ICD-10-CM | POA: Diagnosis not present

## 2022-10-18 DIAGNOSIS — I10 Essential (primary) hypertension: Secondary | ICD-10-CM | POA: Diagnosis not present

## 2022-10-18 DIAGNOSIS — M5416 Radiculopathy, lumbar region: Secondary | ICD-10-CM | POA: Diagnosis not present

## 2022-10-18 DIAGNOSIS — F1120 Opioid dependence, uncomplicated: Secondary | ICD-10-CM | POA: Diagnosis not present

## 2022-10-22 DIAGNOSIS — Z79899 Other long term (current) drug therapy: Secondary | ICD-10-CM | POA: Diagnosis not present

## 2022-10-23 ENCOUNTER — Inpatient Hospital Stay: Payer: Medicare HMO

## 2022-10-23 ENCOUNTER — Inpatient Hospital Stay: Payer: Medicare HMO | Admitting: Family

## 2022-10-28 ENCOUNTER — Inpatient Hospital Stay: Payer: Medicare HMO

## 2022-10-28 ENCOUNTER — Encounter: Payer: Self-pay | Admitting: Family

## 2022-10-28 ENCOUNTER — Inpatient Hospital Stay: Payer: Medicare HMO | Attending: Family | Admitting: Family

## 2022-10-28 VITALS — BP 132/92 | HR 78 | Temp 98.6°F | Resp 17 | Ht 65.5 in | Wt 179.1 lb

## 2022-10-28 DIAGNOSIS — F17210 Nicotine dependence, cigarettes, uncomplicated: Secondary | ICD-10-CM

## 2022-10-28 DIAGNOSIS — D509 Iron deficiency anemia, unspecified: Secondary | ICD-10-CM

## 2022-10-28 DIAGNOSIS — I10 Essential (primary) hypertension: Secondary | ICD-10-CM | POA: Diagnosis not present

## 2022-10-28 DIAGNOSIS — J449 Chronic obstructive pulmonary disease, unspecified: Secondary | ICD-10-CM | POA: Diagnosis not present

## 2022-10-28 DIAGNOSIS — D649 Anemia, unspecified: Secondary | ICD-10-CM

## 2022-10-28 NOTE — Progress Notes (Signed)
Sophia Smith  Clinical Social Smith was referred by medical provider for assessment of psychosocial needs.  Clinical Social Worker contacted patient by phone to offer support and assess for needs.    Discussed patient's Social Determinants of Health.  She currently receives SSDI and has [employer].  She likes where she lives, but the rent has caused some financial strain.  She has looked for other apartments, but has found the rent is comparable.  She will remain where she is for now.  Discussed food insecurity.  She does not qualify for Meals on Wheels at this time.  She stated she would be interested in food pantries and agreed to have CSW mail her a list.  Also included contact information.  Patient expressed no other needs.     Sophia Mackintosh, LCSW  Clinical Social Worker [facility]

## 2022-10-28 NOTE — Progress Notes (Signed)
Hematology/Oncology Consultation   Name: Sophia Smith      MRN: [id number]    Location: Room/bed info not found  Date: 10/28/2022 Time:8:53 AM   REFERRING PHYSICIAN: Iran Planas, PA-C  REASON FOR CONSULT: Iron deficiency anemia    DIAGNOSIS: Iron deficiency anemia   HISTORY OF PRESENT ILLNESS: [name] is a very pleasant 62 yo caucasian female with long history of iron deficiency anemia.  She last received IV iron in 10/2017.  She has not noted any obvious blood loss. No abnormal bruising, no petechiae.  Iron saturation was 18% and ferritin 9 a couple weeks ago.  She is symptomatic with fatigue.  SOB with COPD has remained stable.  No known familial history of anemia.  No personal history of cancer. Both her mother and father had history of skin cancer.  She has 1 daughter and one history of miscarriage.  No history of diabetes or thyroid disease.   No fever, chills, n/v, cough, rash, dizziness, chest pain, palpitations, abdominal pain or changes in bowel or bladder habits.  No swelling, tenderness, numbness or tingling in her extremities.  She has chronic back and right hip pain  She has past history of colonic polyps and sigmoid resection due to diverticulitis.  She had colonoscopy in 06/2022. She states that she had 1 precancerous polyp removed from the descending colon.  Her appetite comes and goes. She states that she sometimes has issues affording groceries. We have reached out to our social worker Jeani Hawking for further help with this issue.  Her weight is stable at 179 lbs.  She is working on getting in more water and less [employer]. She bought water flavoring to help it be more appealing.  No ETOH or recreational drug use.  She smokes 1/2-1 ppd.  She has not felt like being active or exercising recently due to fatigue.   ROS: All other 10 point review of systems is negative.   PAST MEDICAL HISTORY:   Past Medical History:  Diagnosis Date   Anemia    Back disorder     Cellulitis of left lower leg 123456   Complication of anesthesia    was having a back surgery, prone position, patient vomitted, had to be intubated- no problems since that time.   Fibromyalgia    GERD (gastroesophageal reflux disease)    Heart murmur    History of hiatal hernia    Hypertension    Iron deficiency anemia    recieved IV iron.   Knee gives out    Other disorders of continuity of bone, right pelvic region and thigh    Stroke ([place])    2 TIA's    ALLERGIES: Allergies  Allergen Reactions   Nsaids     Aleve,ibuprofen all seems to caused sharp pains in abdominal and discomfort.       MEDICATIONS:  Current Outpatient Medications on File Prior to Visit  Medication Sig Dispense Refill   albuterol (VENTOLIN HFA) 108 (90 Base) MCG/ACT inhaler Inhale 2 puffs into the lungs every 4 (four) hours as needed for wheezing or shortness of breath. 18 g 1   amitriptyline (ELAVIL) 150 MG tablet TAKE ONE TABLET BY MOUTH AT BEDTIME (Patient taking differently: Take 150 mg by mouth at bedtime.) 90 tablet 1   amLODipine (NORVASC) 10 MG tablet TAKE ONE TABLET BY MOUTH DAILY 90 tablet 1   atorvastatin (LIPITOR) 40 MG tablet TAKE ONE TABLET BY MOUTH DAILY 90 tablet 3   cyclobenzaprine (FLEXERIL) 10 MG tablet  Take 1 tablet (10 mg total) by mouth at bedtime. 30 tablet 2   diclofenac sodium (VOLTAREN) 1 % GEL Apply 4 g topically 4 (four) times daily. (Patient taking differently: Apply 4 g topically daily as needed (hip pain).) 1 Tube 3   DULoxetine (CYMBALTA) 60 MG capsule TAKE ONE CAPSULE BY MOUTH TWICE A DAY 180 capsule 1   furosemide (LASIX) 40 MG tablet Take 1 tablet (40 mg total) by mouth daily. As needed for lower extremity edema. (Patient taking differently: Take 40 mg by mouth daily as needed for fluid or edema.) 30 tablet 0   gabapentin (NEURONTIN) 300 MG capsule Take 900 mg by mouth 3 (three) times daily.     hydrochlorothiazide (HYDRODIURIL) 12.5 MG tablet TAKE ONE TABLET BY MOUTH  DAILY. NEED TO HAVE LABS TAKEN FOR FURTHER REFILLS. 30 tablet 0   NARCAN 4 MG/0.1ML LIQD nasal spray kit Place 1 spray into the nose once.     oxyCODONE-acetaminophen (PERCOCET) 10-325 MG tablet Take by mouth.     pantoprazole (PROTONIX) 40 MG tablet TAKE ONE TABLET BY MOUTH DAILY (Patient taking differently: Take 40 mg by mouth daily.) 90 tablet 3   potassium chloride SA (KLOR-CON) 20 MEQ tablet Take 2 tablets (40 mEq total) by mouth daily. (Patient taking differently: Take 40 mEq by mouth daily as needed (Only take lasix).) 14 tablet 3   predniSONE (DELTASONE) 50 MG tablet Take 1 tablet (50 mg total) by mouth daily. 5 tablet 0   triamcinolone (KENALOG) 0.1 % Apply 1 application topically 2 (two) times daily. Combine with Eucerin over dry/scaly areas. (Patient taking differently: Apply 1 application  topically daily as needed (dry skin). Combine with Eucerin over dry/scaly areas.) 45 g 1   No current facility-administered medications on file prior to visit.     PAST SURGICAL HISTORY Past Surgical History:  Procedure Laterality Date   ARTHRODESIS METATARSALPHALANGEAL JOINT (MTPJ) Right 08/03/2021   Procedure: FUSION RIGHT GREAT TOE METATARSALPHALANGEAL JOINT (MTPJ), WEIL OSTEOTOMY 2ND METATARSAL, PROXIMAL INERPHALANGEAL RESECTION 2ND AND 3RD TOES;  Surgeon: Newt Minion, MD;  Location: [facility];  Service: Orthopedics;  Laterality: Right;   CHOLECYSTECTOMY     [place]  Right Carotid Right 2012   Dissecton with pseudoanevrysm   GASTROCNEMIUS RECESSION Right 08/03/2021   Procedure: RIGHT GASTROCNEMIUS RECESSION;  Surgeon: Newt Minion, MD;  Location: [facility];  Service: Orthopedics;  Laterality: Right;   LEG SURGERY Bilateral    numerous leg surgeries post accident age 53   SIGMOID RESECTION / RECTOPEXY     Traumatic amputation Right    5th toe, ran over by a lawn mower.  was a child.    FAMILY HISTORY: Family History  Problem Relation Age of Onset   Heart attack Father    Cancer Father     Breast cancer Maternal Grandmother     SOCIAL HISTORY:  reports that she has been smoking cigarettes. She has a 20.00 pack-year smoking history. She has never used smokeless tobacco. She reports that she does not drink alcohol and does not use drugs.  PERFORMANCE STATUS: The patient's performance status is 1 - Symptomatic but completely ambulatory  PHYSICAL EXAM: Most Recent Vital Signs: Last menstrual period 12/11/2012. BP (!) 132/92 (BP Location: Right Arm, Patient Position: Sitting)   Pulse 78   Temp 98.6 F (37 C) (Oral)   Resp 17   Ht 5' 5.5" (1.664 m)   Wt 179 lb 1.9 oz (81.2 kg)   LMP 12/11/2012   SpO2  99%   BMI 29.35 kg/m   General Appearance:    Alert, cooperative, no distress, appears stated age  Head:    Normocephalic, without obvious abnormality, atraumatic  Eyes:    PERRL, conjunctiva/corneas clear, EOM's intact, fundi    benign, both eyes        Throat:   Lips, mucosa, and tongue normal; teeth and gums normal  Neck:   Supple, symmetrical, trachea midline, no adenopathy;    thyroid:  no enlargement/tenderness/nodules; no carotid   bruit or JVD  Back:     Symmetric, no curvature, ROM normal, no CVA tenderness  Lungs:     Clear to auscultation bilaterally, respirations unlabored  Chest Wall:    No tenderness or deformity   Heart:    Regular rate and rhythm, S1 and S2 normal, no murmur, rub   or gallop     Abdomen:     Soft, non-tender, bowel sounds active all four quadrants,    no masses, no organomegaly        Extremities:   Extremities normal, atraumatic, no cyanosis or edema  Pulses:   2+ and symmetric all extremities  Skin:   Skin color, texture, turgor normal, no rashes or lesions  Lymph nodes:   Cervical, supraclavicular, and axillary nodes normal  Neurologic:   CNII-XII intact, normal strength, sensation and reflexes    throughout    LABORATORY DATA:  No results found for this or any previous visit (from the past 48 hour(s)).    RADIOGRAPHY: No  results found.     PATHOLOGY:   ASSESSMENT/PLAN: Ms. Privette is a very pleasant 61 yo caucasian female with long history of iron deficiency anemia.  We will get her set up for 3 doses of IV iron starting this week.  Follow-up in 8 weeks.   All questions were answered. The patient knows to call the clinic with any problems, questions or concerns. We can certainly see the patient much sooner if necessary.   Lottie Dawson, NP

## 2022-10-28 NOTE — Progress Notes (Signed)
Error. -MM 

## 2022-11-04 ENCOUNTER — Inpatient Hospital Stay: Payer: Medicare HMO

## 2022-11-05 ENCOUNTER — Inpatient Hospital Stay: Payer: Medicare HMO | Attending: Family

## 2022-11-05 VITALS — BP 125/73 | HR 78 | Temp 98.0°F | Resp 17

## 2022-11-05 DIAGNOSIS — D500 Iron deficiency anemia secondary to blood loss (chronic): Secondary | ICD-10-CM

## 2022-11-05 DIAGNOSIS — D509 Iron deficiency anemia, unspecified: Secondary | ICD-10-CM | POA: Insufficient documentation

## 2022-11-05 DIAGNOSIS — K648 Other hemorrhoids: Secondary | ICD-10-CM

## 2022-11-05 MED ORDER — SODIUM CHLORIDE 0.9 % IV SOLN
200.0000 mg | Freq: Once | INTRAVENOUS | Status: AC
  Administered 2022-11-05: 200 mg via INTRAVENOUS
  Filled 2022-11-05: qty 10

## 2022-11-05 MED ORDER — SODIUM CHLORIDE 0.9 % IV SOLN: Freq: Once | INTRAVENOUS | Status: AC

## 2022-11-05 NOTE — Patient Instructions (Signed)

## 2022-11-11 ENCOUNTER — Inpatient Hospital Stay: Payer: Medicare HMO

## 2022-11-11 VITALS — BP 138/87 | HR 89 | Temp 97.9°F | Resp 17

## 2022-11-11 DIAGNOSIS — D500 Iron deficiency anemia secondary to blood loss (chronic): Secondary | ICD-10-CM

## 2022-11-11 DIAGNOSIS — K648 Other hemorrhoids: Secondary | ICD-10-CM

## 2022-11-11 DIAGNOSIS — D509 Iron deficiency anemia, unspecified: Secondary | ICD-10-CM | POA: Diagnosis not present

## 2022-11-11 MED ORDER — SODIUM CHLORIDE 0.9 % IV SOLN
200.0000 mg | Freq: Once | INTRAVENOUS | Status: AC
  Administered 2022-11-11: 200 mg via INTRAVENOUS
  Filled 2022-11-11: qty 200

## 2022-11-11 MED ORDER — SODIUM CHLORIDE 0.9 % IV SOLN: Freq: Once | INTRAVENOUS | Status: AC

## 2022-11-11 NOTE — Patient Instructions (Signed)

## 2022-11-16 DIAGNOSIS — R825 Elevated urine levels of drugs, medicaments and biological substances: Secondary | ICD-10-CM | POA: Diagnosis not present

## 2022-11-16 DIAGNOSIS — M5416 Radiculopathy, lumbar region: Secondary | ICD-10-CM | POA: Diagnosis not present

## 2022-11-16 DIAGNOSIS — Z79899 Other long term (current) drug therapy: Secondary | ICD-10-CM | POA: Diagnosis not present

## 2022-11-16 DIAGNOSIS — Z79891 Long term (current) use of opiate analgesic: Secondary | ICD-10-CM | POA: Diagnosis not present

## 2022-11-16 DIAGNOSIS — G8929 Other chronic pain: Secondary | ICD-10-CM | POA: Diagnosis not present

## 2022-11-16 DIAGNOSIS — F17210 Nicotine dependence, cigarettes, uncomplicated: Secondary | ICD-10-CM | POA: Diagnosis not present

## 2022-11-16 DIAGNOSIS — F1120 Opioid dependence, uncomplicated: Secondary | ICD-10-CM | POA: Diagnosis not present

## 2022-11-18 ENCOUNTER — Telehealth: Payer: Self-pay | Admitting: Physician Assistant

## 2022-11-18 ENCOUNTER — Other Ambulatory Visit: Payer: Self-pay | Admitting: Physician Assistant

## 2022-11-18 ENCOUNTER — Inpatient Hospital Stay: Payer: Medicare HMO

## 2022-11-18 VITALS — BP 125/68 | HR 80 | Temp 98.5°F | Resp 17

## 2022-11-18 DIAGNOSIS — D509 Iron deficiency anemia, unspecified: Secondary | ICD-10-CM | POA: Diagnosis not present

## 2022-11-18 DIAGNOSIS — D500 Iron deficiency anemia secondary to blood loss (chronic): Secondary | ICD-10-CM

## 2022-11-18 DIAGNOSIS — M797 Fibromyalgia: Secondary | ICD-10-CM

## 2022-11-18 DIAGNOSIS — F3342 Major depressive disorder, recurrent, in full remission: Secondary | ICD-10-CM

## 2022-11-18 DIAGNOSIS — K648 Other hemorrhoids: Secondary | ICD-10-CM

## 2022-11-18 MED ORDER — SODIUM CHLORIDE 0.9 % IV SOLN
200.0000 mg | Freq: Once | INTRAVENOUS | Status: AC
  Administered 2022-11-18: 200 mg via INTRAVENOUS
  Filled 2022-11-18: qty 200

## 2022-11-18 MED ORDER — DULOXETINE HCL 60 MG PO CPEP: 60.0000 mg | ORAL_CAPSULE | Freq: Two times a day (BID) | ORAL | 0 refills | Status: DC

## 2022-11-18 MED ORDER — SODIUM CHLORIDE 0.9 % IV SOLN: Freq: Once | INTRAVENOUS | Status: AC

## 2022-11-18 NOTE — Patient Instructions (Signed)

## 2022-11-18 NOTE — Telephone Encounter (Signed)
Pt called requesting a refill of DULoxetine (CYMBALTA) 60 MG capsule [[id number]] to be sent to [facility] [id number] - [place],. Pt stated she is going out of town tomorrow morning.

## 2022-11-21 ENCOUNTER — Telehealth: Payer: Self-pay | Admitting: Acute Care

## 2022-11-21 DIAGNOSIS — Z87891 Personal history of nicotine dependence: Secondary | ICD-10-CM

## 2022-11-21 DIAGNOSIS — F17210 Nicotine dependence, cigarettes, uncomplicated: Secondary | ICD-10-CM

## 2022-11-21 DIAGNOSIS — Z122 Encounter for screening for malignant neoplasm of respiratory organs: Secondary | ICD-10-CM

## 2022-11-21 NOTE — Telephone Encounter (Signed)
This scan is ok for a 12 month follow up. Thanks

## 2022-11-21 NOTE — Telephone Encounter (Signed)
Result letter sent to pt.  Results faxed to PCP. Order placed for 12 mth lung screening CT.  

## 2022-11-29 DIAGNOSIS — Z1330 Encounter for screening examination for mental health and behavioral disorders, unspecified: Secondary | ICD-10-CM | POA: Diagnosis not present

## 2022-11-29 DIAGNOSIS — Z01419 Encounter for gynecological examination (general) (routine) without abnormal findings: Secondary | ICD-10-CM | POA: Diagnosis not present

## 2022-11-29 DIAGNOSIS — Z780 Asymptomatic menopausal state: Secondary | ICD-10-CM | POA: Diagnosis not present

## 2022-11-29 DIAGNOSIS — F17200 Nicotine dependence, unspecified, uncomplicated: Secondary | ICD-10-CM | POA: Diagnosis not present

## 2022-11-29 NOTE — Addendum Note (Signed)
Addended by: Jomarie Longs on: 11/29/2022 02:49 PM   Modules accepted: Orders

## 2022-12-14 DIAGNOSIS — R825 Elevated urine levels of drugs, medicaments and biological substances: Secondary | ICD-10-CM | POA: Diagnosis not present

## 2022-12-14 DIAGNOSIS — Z6829 Body mass index (BMI) 29.0-29.9, adult: Secondary | ICD-10-CM | POA: Diagnosis not present

## 2022-12-14 DIAGNOSIS — G8929 Other chronic pain: Secondary | ICD-10-CM | POA: Diagnosis not present

## 2022-12-14 DIAGNOSIS — M5416 Radiculopathy, lumbar region: Secondary | ICD-10-CM | POA: Diagnosis not present

## 2022-12-14 DIAGNOSIS — Z79891 Long term (current) use of opiate analgesic: Secondary | ICD-10-CM | POA: Diagnosis not present

## 2022-12-14 DIAGNOSIS — Z79899 Other long term (current) drug therapy: Secondary | ICD-10-CM | POA: Diagnosis not present

## 2022-12-14 DIAGNOSIS — F17210 Nicotine dependence, cigarettes, uncomplicated: Secondary | ICD-10-CM | POA: Diagnosis not present

## 2022-12-14 DIAGNOSIS — F1120 Opioid dependence, uncomplicated: Secondary | ICD-10-CM | POA: Diagnosis not present

## 2022-12-18 DIAGNOSIS — Z79899 Other long term (current) drug therapy: Secondary | ICD-10-CM | POA: Diagnosis not present

## 2022-12-23 ENCOUNTER — Inpatient Hospital Stay: Payer: Medicare HMO | Admitting: Medical Oncology

## 2022-12-23 ENCOUNTER — Inpatient Hospital Stay: Payer: Medicare HMO | Attending: Family

## 2023-01-11 DIAGNOSIS — Z79899 Other long term (current) drug therapy: Secondary | ICD-10-CM | POA: Diagnosis not present

## 2023-01-11 DIAGNOSIS — F1120 Opioid dependence, uncomplicated: Secondary | ICD-10-CM | POA: Diagnosis not present

## 2023-01-11 DIAGNOSIS — M5416 Radiculopathy, lumbar region: Secondary | ICD-10-CM | POA: Diagnosis not present

## 2023-01-11 DIAGNOSIS — G8929 Other chronic pain: Secondary | ICD-10-CM | POA: Diagnosis not present

## 2023-01-15 DIAGNOSIS — Z79899 Other long term (current) drug therapy: Secondary | ICD-10-CM | POA: Diagnosis not present

## 2023-01-27 ENCOUNTER — Inpatient Hospital Stay: Payer: Medicare HMO | Admitting: Medical Oncology

## 2023-01-27 ENCOUNTER — Inpatient Hospital Stay: Payer: Medicare HMO

## 2023-02-03 ENCOUNTER — Ambulatory Visit (INDEPENDENT_AMBULATORY_CARE_PROVIDER_SITE_OTHER): Payer: Medicare HMO | Admitting: Physician Assistant

## 2023-02-03 ENCOUNTER — Encounter: Payer: Self-pay | Admitting: Physician Assistant

## 2023-02-03 VITALS — BP 145/85 | HR 77 | Ht 65.5 in | Wt 175.0 lb

## 2023-02-03 DIAGNOSIS — J40 Bronchitis, not specified as acute or chronic: Secondary | ICD-10-CM

## 2023-02-03 DIAGNOSIS — J329 Chronic sinusitis, unspecified: Secondary | ICD-10-CM | POA: Diagnosis not present

## 2023-02-03 MED ORDER — PREDNISONE 50 MG PO TABS
ORAL_TABLET | ORAL | 0 refills | Status: DC
Start: 2023-02-03 — End: 2023-03-31

## 2023-02-03 MED ORDER — DOXYCYCLINE HYCLATE 100 MG PO TABS
100.0000 mg | ORAL_TABLET | Freq: Two times a day (BID) | ORAL | 0 refills | Status: DC
Start: 2023-02-03 — End: 2023-03-31

## 2023-02-03 NOTE — Progress Notes (Signed)
   Acute Office Visit  Subjective:     Patient ID: Sophia Smith, female    DOB: May 15, 1961, 62 y.o.   MRN: [id number]  No chief complaint on file.   HPI Patient is in today for left ear pain, cough and congestion  Left ear pain 4 days  [place]-   ROS      Objective:    LMP 12/11/2012  {Vitals History (Optional):23777}  Physical Exam  No results found for any visits on 02/03/23.      Assessment & Plan:   Problem List Items Addressed This Visit   None   No orders of the defined types were placed in this encounter.   No follow-ups on file.  Tandy Gaw, PA-C

## 2023-02-03 NOTE — Patient Instructions (Signed)

## 2023-02-04 ENCOUNTER — Encounter: Payer: Self-pay | Admitting: Physician Assistant

## 2023-02-08 DIAGNOSIS — R825 Elevated urine levels of drugs, medicaments and biological substances: Secondary | ICD-10-CM | POA: Diagnosis not present

## 2023-02-08 DIAGNOSIS — Z79891 Long term (current) use of opiate analgesic: Secondary | ICD-10-CM | POA: Diagnosis not present

## 2023-02-08 DIAGNOSIS — Z79899 Other long term (current) drug therapy: Secondary | ICD-10-CM | POA: Diagnosis not present

## 2023-02-08 DIAGNOSIS — F1120 Opioid dependence, uncomplicated: Secondary | ICD-10-CM | POA: Diagnosis not present

## 2023-02-08 DIAGNOSIS — G8929 Other chronic pain: Secondary | ICD-10-CM | POA: Diagnosis not present

## 2023-02-08 DIAGNOSIS — M5416 Radiculopathy, lumbar region: Secondary | ICD-10-CM | POA: Diagnosis not present

## 2023-02-08 DIAGNOSIS — Z6829 Body mass index (BMI) 29.0-29.9, adult: Secondary | ICD-10-CM | POA: Diagnosis not present

## 2023-02-11 ENCOUNTER — Ambulatory Visit (INDEPENDENT_AMBULATORY_CARE_PROVIDER_SITE_OTHER): Payer: Medicare HMO | Admitting: Physician Assistant

## 2023-02-11 DIAGNOSIS — Z0000 Encounter for general adult medical examination without abnormal findings: Secondary | ICD-10-CM

## 2023-02-11 NOTE — Progress Notes (Signed)
MEDICARE ANNUAL WELLNESS VISIT  02/11/2023  Telephone Visit Disclaimer This Medicare AWV was conducted by telephone due to national recommendations for restrictions regarding the COVID-19 Pandemic (e.g. social distancing).  I verified, using two identifiers, that I am speaking with Sophia Smith or their authorized healthcare agent. I discussed the limitations, risks, security, and privacy concerns of performing an evaluation and management service by telephone and the potential availability of an in-person appointment in the future. The patient expressed understanding and agreed to proceed.  Location of Patient: home Location of Provider (nurse):  Provider home  Subjective:    Sophia Smith is a 62 y.o. female patient of Jomarie Longs, PA-C who had a [employer] Visit today via telephone. Dacy is Disabled and lives with their daughter. she has 1 child. she reports that she is socially active and does interact with friends/family regularly. she is moderately physically active and enjoys reading.  Patient Care Team: Nolene Ebbs as PCP - General (Family Medicine) Romelle Starcher, PA-C as Consulting Physician (Pain Medicine)     02/11/2023    2:38 PM 10/28/2022    8:58 AM 11/19/2021    2:24 PM 08/03/2021    6:08 AM 03/15/2019    2:10 PM 11/06/2017   11:49 AM 10/10/2017   11:56 AM  Advanced Directives  Does Patient Have a Medical Advance Directive? No Yes No No No Yes Yes  Type of Advance Directive  Living will;Healthcare Power of [employer] Power of [place];Living will   Does patient want to make changes to medical advance directive?       No - Patient declined  Copy of Healthcare Power of Attorney in Chart?  No - copy requested       Would patient like information on creating a medical advance directive? No - Patient declined  No - Patient declined  Yes (MAU/Ambulatory/Procedural Areas - Information given)      Hospital Utilization  Over the Past 12 Months: # of hospitalizations or ER visits: 0 # of surgeries: 1  Review of Systems    Patient reports that her overall health is unchanged compared to last year.  History obtained from chart review and the patient  Patient Reported Readings (BP, Pulse, CBG, Weight, etc) none  Pain Assessment Pain : No/denies pain     Current Medications & Allergies (verified) Allergies as of 02/11/2023       Reactions   Nsaids    Aleve,ibuprofen all seems to caused sharp pains in abdominal and discomfort.         Medication List        Accurate as of February 11, 2023  2:58 PM. If you have any questions, ask your nurse or doctor.          albuterol 108 (90 Base) MCG/ACT inhaler Commonly known as: VENTOLIN HFA Inhale 2 puffs into the lungs every 4 (four) hours as needed for wheezing or shortness of breath.   amitriptyline 150 MG tablet Commonly known as: ELAVIL TAKE ONE TABLET BY MOUTH AT BEDTIME   amLODipine 10 MG tablet Commonly known as: NORVASC TAKE ONE TABLET BY MOUTH DAILY   atorvastatin 40 MG tablet Commonly known as: LIPITOR TAKE ONE TABLET BY MOUTH DAILY   cyclobenzaprine 10 MG tablet Commonly known as: FLEXERIL Take 1 tablet (10 mg total) by mouth at bedtime.   diclofenac sodium 1 % Gel Commonly known as: VOLTAREN Apply 4 g topically 4 (four)  times daily. What changed:  when to take this reasons to take this   doxycycline 100 MG tablet Commonly known as: VIBRA-TABS Take 1 tablet (100 mg total) by mouth 2 (two) times daily.   DULoxetine 60 MG capsule Commonly known as: CYMBALTA Take 1 capsule (60 mg total) by mouth 2 (two) times daily.   furosemide 40 MG tablet Commonly known as: LASIX Take 1 tablet (40 mg total) by mouth daily. As needed for lower extremity edema. What changed:  when to take this reasons to take this additional instructions   gabapentin 300 MG capsule Commonly known as: NEURONTIN Take 900 mg by mouth 3 (three) times  daily.   hydrochlorothiazide 12.5 MG tablet Commonly known as: HYDRODIURIL TAKE ONE TABLET BY MOUTH DAILY. NEED TO HAVE LABS TAKEN FOR FURTHER REFILLS.   Narcan 4 MG/0.1ML Liqd nasal spray kit Generic drug: naloxone Place 1 spray into the nose once.   oxyCODONE-acetaminophen 10-325 MG tablet Commonly known as: PERCOCET Take by mouth.   pantoprazole 40 MG tablet Commonly known as: PROTONIX TAKE ONE TABLET BY MOUTH DAILY   potassium chloride SA 20 MEQ tablet Commonly known as: KLOR-CON M Take 2 tablets (40 mEq total) by mouth daily. What changed:  when to take this reasons to take this   predniSONE 50 MG tablet Commonly known as: DELTASONE One tab PO daily for 5 days.   triamcinolone cream 0.1 % Commonly known as: KENALOG Apply 1 application topically 2 (two) times daily. Combine with Eucerin over dry/scaly areas. What changed:  when to take this reasons to take this        History (reviewed): Past Medical History:  Diagnosis Date   Anemia    Back disorder    Cellulitis of left lower leg 08/04/2017   Complication of anesthesia    was having a back surgery, prone position, patient vomitted, had to be intubated- no problems since that time.   Fibromyalgia    GERD (gastroesophageal reflux disease)    Heart murmur    History of hiatal hernia    Hypertension    Iron deficiency anemia    recieved IV iron.   Knee gives out    Other disorders of continuity of bone, right pelvic region and thigh    Stroke (HCC)    2 TIA's   Past Surgical History:  Procedure Laterality Date   ARTHRODESIS METATARSALPHALANGEAL JOINT (MTPJ) Right 08/03/2021   Procedure: FUSION RIGHT GREAT TOE METATARSALPHALANGEAL JOINT (MTPJ), WEIL OSTEOTOMY 2ND METATARSAL, PROXIMAL INERPHALANGEAL RESECTION 2ND AND 3RD TOES;  Surgeon: Nadara Mustard, MD;  Location: [facility] OR;  Service: Orthopedics;  Laterality: Right;   CHOLECYSTECTOMY     [place]  Right Carotid Right 2012   Dissecton with pseudoanevrysm    GASTROCNEMIUS RECESSION Right 08/03/2021   Procedure: RIGHT GASTROCNEMIUS RECESSION;  Surgeon: Nadara Mustard, MD;  Location: [facility] OR;  Service: Orthopedics;  Laterality: Right;   LEG SURGERY Bilateral    numerous leg surgeries post accident age 70   SIGMOID RESECTION / RECTOPEXY     Traumatic amputation Right    5th toe, ran over by a lawn mower.  was a child.   Family History  Problem Relation Age of Onset   Heart attack Father    Cancer Father    Breast cancer Maternal Grandmother    Social History   Socioeconomic History   Marital status: Divorced    Spouse name: Not on file   Number of children: 1   Years of  education: 14   Highest education level: Associate degree: academic program  Occupational History   Occupation: nurse    Comment: Disabled  Tobacco Use   Smoking status: Every Day    Packs/day: 0.50    Years: 40.00    Additional pack years: 0.00    Total pack years: 20.00    Types: Cigarettes   Smokeless tobacco: Never  Vaping Use   Vaping Use: Never used  Substance and Sexual Activity   Alcohol use: No   Drug use: No   Sexual activity: Not Currently  Other Topics Concern   Not on file  Social History Narrative   Lives with her daughter. She enjoys reading.   Social Determinants of Health   Financial Resource Strain: Low Risk  (02/11/2023)   Overall Financial Resource Strain (CARDIA)    Difficulty of Paying Living Expenses: Not hard at all  Food Insecurity: No Food Insecurity (02/11/2023)   Hunger Vital Sign    Worried About Running Out of Food in the Last Year: Never true    Ran Out of Food in the Last Year: Never true  Transportation Needs: No Transportation Needs (02/11/2023)   PRAPARE - Administrator, Civil Service (Medical): No    Lack of Transportation (Non-Medical): No  Physical Activity: Inactive (02/11/2023)   Exercise Vital Sign    Days of Exercise per Week: 0 days    Minutes of Exercise per Session: 0 min  Stress: No Stress Concern  Present (02/11/2023)   [employer] of Occupational Health - Occupational Stress Questionnaire    Feeling of Stress : Not at all  Social Connections: Moderately Integrated (02/11/2023)   Social Connection and Isolation Panel [NHANES]    Frequency of Communication with Friends and Family: More than three times a week    Frequency of Social Gatherings with Friends and Family: More than three times a week    Attends Religious Services: More than 4 times per year    Active Member of [employer] or Organizations: Yes    Attends Engineer, structural: More than 4 times per year    Marital Status: Divorced  Recent Concern: Social Connections - Moderately Isolated (02/11/2023)   Social Connection and Isolation Panel [NHANES]    Frequency of Communication with Friends and Family: More than three times a week    Frequency of Social Gatherings with Friends and Family: More than three times a week    Attends Religious Services: More than 4 times per year    Active Member of [employer] or Organizations: No    Attends Banker Meetings: Never    Marital Status: Divorced    Activities of Daily Living    02/11/2023    2:41 PM  In your present state of health, do you have any difficulty performing the following activities:  Hearing? 0  Vision? 0  Difficulty concentrating or making decisions? 0  Walking or climbing stairs? 1  Comment stairs are difficult due to back pain  Dressing or bathing? 0  Doing errands, shopping? 0  Preparing Food and eating ? N  Using the Toilet? N  In the past six months, have you accidently leaked urine? N  Do you have problems with loss of bowel control? N  Managing your Medications? N  Managing your Finances? N  Housekeeping or managing your Housekeeping? N    Patient Education/ Literacy How often do you need to have someone help you when you read instructions, pamphlets, or other  written materials from your doctor or pharmacy?: 1 - Never What is the last  grade level you completed in school?: Bachelor's degree  Exercise    Diet Patient reports consuming 2 meals a day and 2 snack(s) a day Patient reports that her primary diet is: Regular Patient reports that she does have regular access to food.   Depression Screen    02/11/2023    2:39 PM 02/03/2023   10:37 AM 09/19/2022    4:32 PM 06/11/2022    1:27 PM 11/19/2021    2:24 PM 03/22/2020   10:29 AM 09/06/2019   11:38 AM  PHQ 2/9 Scores  PHQ - 2 Score 1 1 2 1  0 1 1  PHQ- 9 Score      2 6     Fall Risk    02/11/2023    2:39 PM 02/03/2023   10:40 AM 09/19/2022    4:32 PM 06/11/2022    1:27 PM 11/19/2021    2:24 PM  Fall Risk   Falls in the past year? 0 0 0 0 0  Number falls in past yr: 0 0 0 0 0  Injury with Fall? 0 0 0 0 0  Risk for fall due to : No Fall Risks No Fall Risks No Fall Risks No Fall Risks No Fall Risks  Follow up Falls evaluation completed Falls evaluation completed Falls evaluation completed Falls evaluation completed Falls evaluation completed     Objective:  SHEKINA CORDELL seemed alert and oriented and she participated appropriately during our telephone visit.  Blood Pressure Weight BMI  BP Readings from Last 3 Encounters:  02/03/23 (!) 145/85  11/18/22 125/68  11/11/22 138/87   Wt Readings from Last 3 Encounters:  02/03/23 175 lb (79.4 kg)  10/28/22 179 lb 1.9 oz (81.2 kg)  10/14/22 174 lb (78.9 kg)   BMI Readings from Last 1 Encounters:  02/03/23 28.68 kg/m    *Unable to obtain current vital signs, weight, and BMI due to telephone visit type  Hearing/Vision  Leauna did not seem to have difficulty with hearing/understanding during the telephone conversation Reports that she has had a formal eye exam by an eye care professional within the past year Reports that she has not had a formal hearing evaluation within the past year *Unable to fully assess hearing and vision during telephone visit type  Cognitive Function:    02/11/2023    2:48 PM 11/19/2021     2:31 PM 03/22/2020   10:08 AM 03/15/2019    2:19 PM  6CIT Screen  What Year? 0 points 0 points 0 points 0 points  What month? 0 points 0 points 0 points 0 points  What time? 0 points 0 points 0 points 0 points  Count back from 20 0 points 0 points 0 points 0 points  Months in reverse 0 points 0 points 0 points 0 points  Repeat phrase 0 points 0 points 0 points 0 points  Total Score 0 points 0 points 0 points 0 points   (Normal:0-7, Significant for Dysfunction: >8)  Normal Cognitive Function Screening: Yes   Immunization & Health Maintenance Record Immunization History  Administered Date(s) Administered   Influenza Whole 05/02/2018   Influenza,inj,Quad PF,6+ Mos 04/04/2014, 05/15/2016, 04/28/2017, 03/30/2021   Influenza-Unspecified 05/01/2012, 05/15/2016, 05/04/2022   PPD Test 10/03/2009   Respiratory Syncytial Virus Vaccine,Recomb Aduvanted(Arexvy) 05/04/2022   Td 08/05/2008   Tdap 02/21/2014    Health Maintenance  Topic Date Due   COVID-19 Vaccine (1) 02/19/2023 (Originally  10/29/1965)   Zoster Vaccines- Shingrix (1 of 2) 05/14/2023 (Originally 10/28/1980)   HIV Screening  02/03/2024 (Originally 10/28/1975)   INFLUENZA VACCINE  03/06/2023   Colonoscopy  07/03/2023   Lung Cancer Screening  10/14/2023   MAMMOGRAM  01/22/2024   Medicare Annual Wellness (AWV)  02/11/2024   DTaP/Tdap/Td (3 - Td or Tdap) 02/22/2024   PAP SMEAR-Modifier  10/12/2025   Hepatitis C Screening  Completed   HPV VACCINES  Aged Out       Assessment  This is a routine wellness examination for Sophia Smith.  Health Maintenance: Due or Overdue There are no preventive care reminders to display for this patient.   Sophia Smith does not need a referral for Community Assistance: Care Management:   no Social Work:    no Prescription Assistance:  no Nutrition/Diabetes Education:  no   Plan:  Personalized Goals  Goals Addressed               This Visit's Progress     Patient Stated  (pt-stated)        02/11/2023 AWV Goal: Exercise for General Health  Patient will verbalize understanding of the benefits of increased physical activity: Exercising regularly is important. It will improve your overall fitness, flexibility, and endurance. Regular exercise also will improve your overall health. It can help you control your weight, reduce stress, and improve your bone density. Over the next year, patient will increase physical activity as tolerated with a goal of at least 150 minutes of moderate physical activity per week.  You can tell that you are exercising at a moderate intensity if your heart starts beating faster and you start breathing faster but can still hold a conversation. Moderate-intensity exercise ideas include: Walking 1 mile (1.6 km) in about 15 minutes Biking Hiking Golfing Dancing Water aerobics Patient will verbalize understanding of everyday activities that increase physical activity by providing examples like the following: Yard work, such as: Insurance underwriter Gardening Washing windows or floors Patient will be able to explain general safety guidelines for exercising:  Before you start a new exercise program, talk with your health care provider. Do not exercise so much that you hurt yourself, feel dizzy, or get very short of breath. Wear comfortable clothes and wear shoes with good support. Drink plenty of water while you exercise to prevent dehydration or heat stroke. Work out until your breathing and your heartbeat get faster.        Personalized Health Maintenance & Screening Recommendations  Shingles vaccine Colonoscopy- Patient stated that she was told five years from her previous one which was in November, 2023. She will contact the GI office to confirm.   Lung Cancer Screening Recommended: yes; up-to-date.  (Low Dose CT Chest recommended if Age 62-80 years,  20 pack-year currently smoking OR have quit w/in past 15 years) Hepatitis C Screening recommended: no HIV Screening recommended: yes  Advanced Directives: Written information was not prepared per patient's request.  Referrals & Orders No orders of the defined types were placed in this encounter.   Follow-up Plan Follow-up with Jomarie Longs, PA-C as planned Schedule shingles vaccine at the pharmacy.  Medicare wellness visit in one year.  AVS printed and mailed to the patient.    I have personally reviewed and noted the following in the patient's chart:   Medical and social history Use of alcohol, tobacco or illicit drugs  Current medications and supplements Functional ability and status Nutritional status Physical activity Advanced directives List of other physicians Hospitalizations, surgeries, and ER visits in previous 12 months Vitals Screenings to include cognitive, depression, and falls Referrals and appointments  In addition, I have reviewed and discussed with Sophia Smith certain preventive protocols, quality metrics, and best practice recommendations. A written personalized care plan for preventive services as well as general preventive health recommendations is available and can be mailed to the patient at her request.      Modesto Charon, RN BSN  02/11/2023

## 2023-02-11 NOTE — Patient Instructions (Addendum)
MEDICARE ANNUAL WELLNESS VISIT Health Maintenance Summary and Written Plan of Care  Sophia Smith ,  Thank you for allowing me to perform your Medicare Annual Wellness Visit and for your ongoing commitment to your health.   Health Maintenance & Immunization History Health Maintenance  Topic Date Due  . COVID-19 Vaccine (1) 02/19/2023 (Originally 10/29/1965)  . Zoster Vaccines- Shingrix (1 of 2) 05/14/2023 (Originally 10/28/1980)  . HIV Screening  02/03/2024 (Originally 10/28/1976)  . INFLUENZA VACCINE  03/06/2023  . Colonoscopy  07/03/2023  . Lung Cancer Screening  10/14/2023  . MAMMOGRAM  01/22/2024  . Medicare Annual Wellness (AWV)  02/11/2024  . DTaP/Tdap/Td (3 - Td or Tdap) 02/22/2024  . PAP SMEAR-Modifier  10/12/2025  . Hepatitis C Screening  Completed  . HPV VACCINES  Aged Out   Immunization History  Administered Date(s) Administered  . Influenza Whole 05/02/2018  . Influenza,inj,Quad PF,6+ Mos 04/04/2014, 05/15/2016, 04/28/2017, 03/30/2021  . Influenza-Unspecified 05/01/2012, 05/15/2016, 05/04/2022  . PPD Test 10/03/2009  . Respiratory Syncytial Virus Vaccine,Recomb Aduvanted(Arexvy) 05/04/2022  . Td 08/05/2008  . Tdap 02/21/2014    These are the patient goals that we discussed:  Goals Addressed              This Visit's Progress   .  Patient Stated (pt-stated)        02/11/2023 AWV Goal: Exercise for General Health  Patient will verbalize understanding of the benefits of increased physical activity: Exercising regularly is important. It will improve your overall fitness, flexibility, and endurance. Regular exercise also will improve your overall health. It can help you control your weight, reduce stress, and improve your bone density. Over the next year, patient will increase physical activity as tolerated with a goal of at least 150 minutes of moderate physical activity per week.  You can tell that you are exercising at a moderate intensity if your heart starts  beating faster and you start breathing faster but can still hold a conversation. Moderate-intensity exercise ideas include: Walking 1 mile (1.6 km) in about 15 minutes Biking Hiking Golfing Dancing Water aerobics Patient will verbalize understanding of everyday activities that increase physical activity by providing examples like the following: Yard work, such as: Insurance underwriter Gardening Washing windows or floors Patient will be able to explain general safety guidelines for exercising:  Before you start a new exercise program, talk with your health care provider. Do not exercise so much that you hurt yourself, feel dizzy, or get very short of breath. Wear comfortable clothes and wear shoes with good support. Drink plenty of water while you exercise to prevent dehydration or heat stroke. Work out until your breathing and your heartbeat get faster.         This is a list of Health Maintenance Items that are overdue or due now: Shingles vaccine Colonoscopy- Patient stated that she was told five years from her previous one which was in November, 2023. She will contact the GI office to confirm.   Orders/Referrals Placed Today: No orders of the defined types were placed in this encounter.  (Contact our referral department at [phone] if you have not spoken with someone about your referral appointment within the next 5 days)    Follow-up Plan Follow-up with Jomarie Longs, PA-C as planned Schedule shingles vaccine at the pharmacy.  Medicare wellness visit in one year.  AVS printed and mailed to the patient.  Health Maintenance, Female Adopting a healthy lifestyle and getting preventive care are important in promoting health and wellness. Ask your health care provider about: The right schedule for you to have regular tests and exams. Things you can do on your own to prevent diseases  and keep yourself healthy. What should I know about diet, weight, and exercise? Eat a healthy diet  Eat a diet that includes plenty of vegetables, fruits, low-fat dairy products, and lean protein. Do not eat a lot of foods that are high in solid fats, added sugars, or sodium. Maintain a healthy weight Body mass index (BMI) is used to identify weight problems. It estimates body fat based on height and weight. Your health care provider can help determine your BMI and help you achieve or maintain a healthy weight. Get regular exercise Get regular exercise. This is one of the most important things you can do for your health. Most adults should: Exercise for at least 150 minutes each week. The exercise should increase your heart rate and make you sweat (moderate-intensity exercise). Do strengthening exercises at least twice a week. This is in addition to the moderate-intensity exercise. Spend less time sitting. Even light physical activity can be beneficial. Watch cholesterol and blood lipids Have your blood tested for lipids and cholesterol at 61 years of age, then have this test every 5 years. Have your cholesterol levels checked more often if: Your lipid or cholesterol levels are high. You are older than 61 years of age. You are at high risk for heart disease. What should I know about cancer screening? Depending on your health history and family history, you may need to have cancer screening at various ages. This may include screening for: Breast cancer. Cervical cancer. Colorectal cancer. Skin cancer. Lung cancer. What should I know about heart disease, diabetes, and high blood pressure? Blood pressure and heart disease High blood pressure causes heart disease and increases the risk of stroke. This is more likely to develop in people who have high blood pressure readings or are overweight. Have your blood pressure checked: Every 3-5 years if you are 27-61 years of age. Every year if you  are 47 years old or older. Diabetes Have regular diabetes screenings. This checks your fasting blood sugar level. Have the screening done: Once every three years after age 63 if you are at a normal weight and have a low risk for diabetes. More often and at a younger age if you are overweight or have a high risk for diabetes. What should I know about preventing infection? Hepatitis B If you have a higher risk for hepatitis B, you should be screened for this virus. Talk with your health care provider to find out if you are at risk for hepatitis B infection. Hepatitis C Testing is recommended for: Everyone born from 1 through 1965. Anyone with known risk factors for hepatitis C. Sexually transmitted infections (STIs) Get screened for STIs, including gonorrhea and chlamydia, if: You are sexually active and are younger than 61 years of age. You are older than 61 years of age and your health care provider tells you that you are at risk for this type of infection. Your sexual activity has changed since you were last screened, and you are at increased risk for chlamydia or gonorrhea. Ask your health care provider if you are at risk. Ask your health care provider about whether you are at high risk for HIV. Your health care provider may recommend a prescription medicine to help prevent  HIV infection. If you choose to take medicine to prevent HIV, you should first get tested for HIV. You should then be tested every 3 months for as long as you are taking the medicine. Pregnancy If you are about to stop having your period (premenopausal) and you may become pregnant, seek counseling before you get pregnant. Take 400 to 800 micrograms (mcg) of folic acid every day if you become pregnant. Ask for birth control (contraception) if you want to prevent pregnancy. Osteoporosis and menopause Osteoporosis is a disease in which the bones lose minerals and strength with aging. This can result in bone fractures. If  you are 75 years old or older, or if you are at risk for osteoporosis and fractures, ask your health care provider if you should: Be screened for bone loss. Take a calcium or vitamin D supplement to lower your risk of fractures. Be given hormone replacement therapy (HRT) to treat symptoms of menopause. Follow these instructions at home: Alcohol use Do not drink alcohol if: Your health care provider tells you not to drink. You are pregnant, may be pregnant, or are planning to become pregnant. If you drink alcohol: Limit how much you have to: 0-1 drink a day. Know how much alcohol is in your drink. In the [place] one drink equals one 12 oz bottle of beer (355 mL), one 5 oz glass of wine (148 mL), or one 1 oz glass of hard liquor (44 mL). Lifestyle Do not use any products that contain nicotine or tobacco. These products include cigarettes, chewing tobacco, and vaping devices, such as e-cigarettes. If you need help quitting, ask your health care provider. Do not use street drugs. Do not share needles. Ask your health care provider for help if you need support or information about quitting drugs. General instructions Schedule regular health, dental, and eye exams. Stay current with your vaccines. Tell your health care provider if: You often feel depressed. You have ever been abused or do not feel safe at home. Summary Adopting a healthy lifestyle and getting preventive care are important in promoting health and wellness. Follow your health care provider's instructions about healthy diet, exercising, and getting tested or screened for diseases. Follow your health care provider's instructions on monitoring your cholesterol and blood pressure. This information is not intended to replace advice given to you by your health care provider. Make sure you discuss any questions you have with your health care provider. Document Revised: 12/11/2020 Document Reviewed: 12/11/2020 Elsevier Patient Education   2024 [employer].

## 2023-02-13 DIAGNOSIS — Z79899 Other long term (current) drug therapy: Secondary | ICD-10-CM | POA: Diagnosis not present

## 2023-02-28 ENCOUNTER — Other Ambulatory Visit: Payer: Self-pay | Admitting: Physician Assistant

## 2023-02-28 DIAGNOSIS — Z0000 Encounter for general adult medical examination without abnormal findings: Secondary | ICD-10-CM

## 2023-03-04 ENCOUNTER — Ambulatory Visit: Payer: Medicare HMO

## 2023-03-13 ENCOUNTER — Ambulatory Visit: Payer: Medicare HMO

## 2023-03-17 ENCOUNTER — Ambulatory Visit: Payer: Medicare HMO

## 2023-03-18 ENCOUNTER — Ambulatory Visit
Admission: RE | Admit: 2023-03-18 | Discharge: 2023-03-18 | Disposition: A | Discharge status: Home / Self Care | Payer: Medicare HMO | Source: Ambulatory Visit | Attending: Physician Assistant | Admitting: Physician Assistant

## 2023-03-18 DIAGNOSIS — Z0000 Encounter for general adult medical examination without abnormal findings: Secondary | ICD-10-CM

## 2023-03-18 DIAGNOSIS — Z1231 Encounter for screening mammogram for malignant neoplasm of breast: Secondary | ICD-10-CM | POA: Diagnosis not present

## 2023-03-19 DIAGNOSIS — F1120 Opioid dependence, uncomplicated: Secondary | ICD-10-CM | POA: Diagnosis not present

## 2023-03-19 DIAGNOSIS — Z79891 Long term (current) use of opiate analgesic: Secondary | ICD-10-CM | POA: Diagnosis not present

## 2023-03-19 DIAGNOSIS — F17210 Nicotine dependence, cigarettes, uncomplicated: Secondary | ICD-10-CM | POA: Diagnosis not present

## 2023-03-19 DIAGNOSIS — Z79899 Other long term (current) drug therapy: Secondary | ICD-10-CM | POA: Diagnosis not present

## 2023-03-19 DIAGNOSIS — Z6829 Body mass index (BMI) 29.0-29.9, adult: Secondary | ICD-10-CM | POA: Diagnosis not present

## 2023-03-19 DIAGNOSIS — M5416 Radiculopathy, lumbar region: Secondary | ICD-10-CM | POA: Diagnosis not present

## 2023-03-19 DIAGNOSIS — G8929 Other chronic pain: Secondary | ICD-10-CM | POA: Diagnosis not present

## 2023-03-21 DIAGNOSIS — Z79899 Other long term (current) drug therapy: Secondary | ICD-10-CM | POA: Diagnosis not present

## 2023-03-21 NOTE — Progress Notes (Signed)
Normal mammogram. Follow up in 1 year.

## 2023-03-31 ENCOUNTER — Ambulatory Visit (INDEPENDENT_AMBULATORY_CARE_PROVIDER_SITE_OTHER): Payer: Medicare HMO | Admitting: Physician Assistant

## 2023-03-31 ENCOUNTER — Ambulatory Visit (INDEPENDENT_AMBULATORY_CARE_PROVIDER_SITE_OTHER): Payer: Medicare HMO

## 2023-03-31 VITALS — BP 153/97 | HR 73 | Ht 65.5 in | Wt 178.0 lb

## 2023-03-31 DIAGNOSIS — F3342 Major depressive disorder, recurrent, in full remission: Secondary | ICD-10-CM

## 2023-03-31 DIAGNOSIS — Z23 Encounter for immunization: Secondary | ICD-10-CM

## 2023-03-31 DIAGNOSIS — M797 Fibromyalgia: Secondary | ICD-10-CM | POA: Diagnosis not present

## 2023-03-31 DIAGNOSIS — G8929 Other chronic pain: Secondary | ICD-10-CM | POA: Diagnosis not present

## 2023-03-31 DIAGNOSIS — M1711 Unilateral primary osteoarthritis, right knee: Secondary | ICD-10-CM | POA: Diagnosis not present

## 2023-03-31 DIAGNOSIS — M25561 Pain in right knee: Secondary | ICD-10-CM | POA: Diagnosis not present

## 2023-03-31 DIAGNOSIS — M25461 Effusion, right knee: Secondary | ICD-10-CM | POA: Diagnosis not present

## 2023-03-31 DIAGNOSIS — I10 Essential (primary) hypertension: Secondary | ICD-10-CM | POA: Diagnosis not present

## 2023-03-31 NOTE — Patient Instructions (Signed)

## 2023-03-31 NOTE — Progress Notes (Unsigned)
   Acute Office Visit  Subjective:     Patient ID: Sophia Smith, female    DOB: 02-21-1961, 62 y.o.   MRN: [id number]  No chief complaint on file.   HPI Patient is in today for ***  Left knee replacement  Arthritis in both  Not fallen  Nothing makes better  Muscle relaxert   Dr. Karie Schwalbe  mur[facility]  ROS      Objective:    LMP 12/11/2012  {Vitals History (Optional):23777}  Physical Exam  No results found for any visits on 03/31/23.      Assessment & Plan:   Problem List Items Addressed This Visit   None   No orders of the defined types were placed in this encounter.   No follow-ups on file.  Tandy Gaw, PA-C

## 2023-04-01 ENCOUNTER — Encounter: Payer: Self-pay | Admitting: Physician Assistant

## 2023-04-01 DIAGNOSIS — I10 Essential (primary) hypertension: Secondary | ICD-10-CM | POA: Insufficient documentation

## 2023-04-01 MED ORDER — HYDROCHLOROTHIAZIDE 12.5 MG PO TABS: 12.5000 mg | ORAL_TABLET | Freq: Every day | ORAL | 1 refills | Status: AC

## 2023-04-01 MED ORDER — DULOXETINE HCL 60 MG PO CPEP
60.0000 mg | ORAL_CAPSULE | Freq: Two times a day (BID) | ORAL | 1 refills | Status: AC
Start: 2023-04-01 — End: ?

## 2023-04-01 MED ORDER — AMLODIPINE BESYLATE 10 MG PO TABS: 10.0000 mg | ORAL_TABLET | Freq: Every day | ORAL | 1 refills | Status: DC

## 2023-04-03 ENCOUNTER — Ambulatory Visit: Payer: Medicare HMO | Attending: Physician Assistant

## 2023-04-03 ENCOUNTER — Other Ambulatory Visit: Payer: Self-pay

## 2023-04-03 DIAGNOSIS — M79651 Pain in right thigh: Secondary | ICD-10-CM | POA: Insufficient documentation

## 2023-04-03 DIAGNOSIS — M1711 Unilateral primary osteoarthritis, right knee: Secondary | ICD-10-CM | POA: Diagnosis not present

## 2023-04-03 DIAGNOSIS — M25561 Pain in right knee: Secondary | ICD-10-CM | POA: Diagnosis not present

## 2023-04-03 DIAGNOSIS — R262 Difficulty in walking, not elsewhere classified: Secondary | ICD-10-CM | POA: Diagnosis not present

## 2023-04-03 DIAGNOSIS — G8929 Other chronic pain: Secondary | ICD-10-CM | POA: Insufficient documentation

## 2023-04-03 NOTE — Therapy (Signed)
OUTPATIENT PHYSICAL THERAPY LOWER EXTREMITY EVALUATION   Patient Name: Sophia Smith MRN: [id number] DOB:June 06, 1961, 62 y.o., female Today's Date: 04/03/2023  END OF SESSION:  PT End of Session - 04/03/23 1602     Visit Number 1    Number of Visits 8    Date for PT Re-Evaluation 05/29/23    Progress Note Due on Visit 4    PT Start Time 1030    PT Stop Time 1103    PT Time Calculation (min) 33 min             Past Medical History:  Diagnosis Date   Anemia    Back disorder    Cellulitis of left lower leg 08/04/2017   Complication of anesthesia    was having a back surgery, prone position, patient vomitted, had to be intubated- no problems since that time.   Fibromyalgia    GERD (gastroesophageal reflux disease)    Heart murmur    History of hiatal hernia    Hypertension    Iron deficiency anemia    recieved IV iron.   Knee gives out    Other disorders of continuity of bone, right pelvic region and thigh    Stroke (HCC)    2 TIA's   Past Surgical History:  Procedure Laterality Date   ARTHRODESIS METATARSALPHALANGEAL JOINT (MTPJ) Right 08/03/2021   Procedure: FUSION RIGHT GREAT TOE METATARSALPHALANGEAL JOINT (MTPJ), WEIL OSTEOTOMY 2ND METATARSAL, PROXIMAL INERPHALANGEAL RESECTION 2ND AND 3RD TOES;  Surgeon: Nadara Mustard, MD;  Location: [facility] OR;  Service: Orthopedics;  Laterality: Right;   CHOLECYSTECTOMY     [place]  Right Carotid Right 2012   Dissecton with pseudoanevrysm   GASTROCNEMIUS RECESSION Right 08/03/2021   Procedure: RIGHT GASTROCNEMIUS RECESSION;  Surgeon: Nadara Mustard, MD;  Location: [facility];  Service: Orthopedics;  Laterality: Right;   LEG SURGERY Bilateral    numerous leg surgeries post accident age 52   SIGMOID RESECTION / RECTOPEXY     Traumatic amputation Right    5th toe, ran over by a lawn mower.  was a child.   Patient Active Problem List   Diagnosis Date Noted   Primary hypertension 04/01/2023   Chronic pain of right knee 03/31/2023    COPD exacerbation (HCC) 06/11/2022   No energy 06/11/2022   Contracture of right Achilles tendon    Hallux rigidus, right foot    Acquired claw toe of right foot    PCO (posterior capsular opacification) 09/28/2020   Venous stasis dermatitis of both lower extremities 09/05/2020   Edema of left ankle 09/05/2020   Centrilobular emphysema (HCC) 07/31/2020   Bunion of great toe of right foot 04/18/2020   Fall 03/27/2020   Current every day smoker 03/27/2020   Status post left knee replacement 03/22/2020   Panlobular emphysema (HCC) 01/25/2020   Pulmonary nodules 01/25/2020   Aortic atherosclerosis (HCC) 01/24/2020   Rib pain on left side 01/24/2020   Normocytic anemia 01/14/2020   Hypokalemia 01/14/2020   Dyslipidemia (high LDL; low HDL) 03/03/2019   Vertebral artery dissection (HCC) 09/01/2018   Tobacco abuse 09/01/2018   Plantar callus 08/13/2018   Chronic venous stasis 05/14/2018   Thumb pain, left 03/22/2018   Extensor carpi radialis brevis tenosynovitis 03/22/2018   Non-intractable vomiting with nausea 03/22/2018   Hiatal hernia 03/22/2018   Bilateral cataracts 11/04/2017   Disorder of refraction 11/04/2017   Mitral regurgitation 10/22/2017   Tricuspid regurgitation 10/22/2017   Left atrial enlargement 10/22/2017   Lumbar spondylolysis  09/23/2017   Drug-seeking behavior 09/19/2017   Essential hypertension 09/19/2017   Opioid dependence with withdrawal (HCC) 09/19/2017   Chronic bilateral low back pain with bilateral sciatica 09/19/2017   Cigarette nicotine dependence without complication 09/19/2017   Iron deficiency anemia 05/13/2017   Internal hemorrhoid 12/05/2015   Insomnia secondary to chronic pain 06/01/2015   Upper back pain on right side 06/01/2015   S/P total knee replacement 09/13/2014   Left ankle pain 09/13/2014   Plantar fasciitis, bilateral 03/21/2014   Bilateral lower extremity edema 10/26/2013   Left knee pain 10/26/2013   Tricompartment degenerative  joint disease of knee 10/26/2013   Elevated alkaline phosphatase level 10/15/2013   Aneurysm artery, neck (HCC) 09/15/2013   Pelvic fracture (HCC) 09/15/2013   Depression 09/15/2013   Lymphedema of leg 09/13/2013   Pneumothorax on left 09/13/2013   Left rib fracture 09/13/2013   Fibromyalgia 09/13/2013   Lumbar disc disease 09/13/2013    PCP: Tandy Gaw, PA-C  REFERRING PROVIDER: Tandy Gaw, PA-C  REFERRING DIAG:  [phone] (ICD-10-CM) - Chronic pain of right knee  M17.11 (ICD-10-CM) - Tricompartment osteoarthritis of right knee   THERAPY DIAG:  Difficulty in walking, not elsewhere classified  Pain in right thigh  Rationale for Evaluation and Treatment: Rehabilitation  ONSET DATE: Chronic  SUBJECTIVE:   SUBJECTIVE STATEMENT: Ytzel described a chief complaint of R anterior thigh pain. She stated that her L knee was replaced 15 years ago due to advanced osteoarthritis and at that time she was told the R knee would have to be done sooner than later. 9 years ago, she fell and broke her pelvis in two places, and following this, she developed fibromyalgia and disc bulges in her lower back. More recently, she has developed pain in her R anterior thigh. She thought it was just a muscle pull, but then it did not get better with time. 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 Physical therapy is the first intervention she has tried for this issue of R anterior thigh pain.   PERTINENT HISTORY: R foot surgery for bunion correction last year PAIN:  Are you having pain? Yes: NPRS scale: 0 at rest, 5 average, 8-9 at max/10 Pain location: R thigh Pain description: cramping, pulling Aggravating factors: Most often with weightbearing activities but can occur when lying in bed Relieving factors: Rest and elevation  PRECAUTIONS: None  RED FLAGS: None   WEIGHT BEARING RESTRICTIONS: No  FALLS:  Has patient fallen in  last 6 months? No  PATIENT GOALS: Increase comfort  OBJECTIVE:   DIAGNOSTIC FINDINGS: Radiographs of the R knee were taken on 03/31/2023 but no report is available yet  PATIENT SURVEYS:  FOTO Intake: 44, risk adjusted intake 46, predicted 55  COGNITION: Overall cognitive status: Within functional limits for tasks assessed     MUSCLE LENGTH: Hamstrings: Right 70 deg; Left 70 deg (in 90/90 position) Sidelying rectus femoris: R limited by R knee ROM, but reproduces R thigh pain - cervical flexion increased pain so could be R femoral nerve irritation  PALPATION: Palpation of the R lower lumbar paraspinals and quadratus lumborum radiates pain to the R anterior thigh; pain with palpation of the L posterior hip musculature but no radiating symptoms  LOWER EXTREMITY ROM:  Passive ROM Right eval Left eval  Hip flexion WNL WNL  Hip extension Lacking 5 degrees to neutral Lacking 5 degrees to neutral  Hip abduction  Hip adduction    Hip internal rotation WNL WNL  Hip external rotation WNL WNL  Knee flexion 110 120  Knee extension 30 15  Ankle dorsiflexion    Ankle plantarflexion    Ankle inversion    Ankle eversion     (Blank rows = not tested)  LOWER EXTREMITY MMT:  MMT Right eval Left eval  Hip flexion 3+/5 3+/5  Hip extension 4-/5 4-/5  Hip abduction 3+/5 3+/5  Hip adduction 3+/5 3+/5  Hip internal rotation    Hip external rotation 3+/5 3+/5  Knee flexion 4/5 4/5  Knee extension 4/5 5/5  Ankle dorsiflexion    Ankle plantarflexion    Ankle inversion    Ankle eversion     (Blank rows = not tested)  TODAY'S TREATMENT:                                                                                                                               [facility] Adult PT Treatment:                                                DATE: 04/03/2023  Therapeutic Exercise: Instructed: self soft tissue mobilization of the R lumbar paraspinals/quadratus lumborum in hooklying and in  standing against a wall using a tennis ball  PATIENT EDUCATION:  Education details: Exam findings and plan of care Person educated: Patient Education method: Explanation and Demonstration Education comprehension: verbalized understanding  HOME EXERCISE PROGRAM: Access Code: [email or web address] URL: [email or web address] Date: 04/03/2023 Prepared by: Edmonia Caprio  Program Notes (1) self soft tissue mobilization of the R lumbar paraspinals/quadratus lumborum in hooklying and in standing against a wall using a tennis ball  ASSESSMENT:  CLINICAL IMPRESSION: Patient is a 62 y.o. who was seen today for physical therapy evaluation and treatment for R thigh pain. Symptoms were reproduced with R femoral nerve provocation testing and palpation at the R lower lumbar musculature, making referral from proximal structures most likely. The patient does have valgus deformity and significant loss of range of motion at the R knee likely related to advanced osteoarthritis, which is also a consideration if symptom do not fully improve with treatment to the lumbar spine. Physical therapy is indicated.   OBJECTIVE IMPAIRMENTS: Abnormal gait, difficulty walking, decreased ROM, decreased strength, impaired flexibility, and pain.   ACTIVITY LIMITATIONS: standing, squatting, and stairs  REHAB POTENTIAL: Good  CLINICAL DECISION MAKING: Evolving/moderate complexity  EVALUATION COMPLEXITY: Moderate   GOALS: Goals reviewed with patient? Yes  SHORT TERM GOALS: Target date:  05/01/2023  Patient will report at least a 50% improvement in chief complaint. Baseline: Goal status: INITIAL  2.  Patient will improve on FOTO questionnaire to at least 50. Baseline:  Goal status: INITIAL  3.  Palpation of the R lower lumbar musculature will not refer pain to  the R anterior thigh. Baseline:  Goal status: INITIAL  LONG TERM GOALS: Target date: 05/29/2023  Patient will report at least a 50% improvement in R  anterior thigh pain. Baseline:  Goal status: INITIAL  2.  Patient will improve FOTO score to at least 55. Baseline:  Goal status: INITIAL  3.  Patient will have a negative R femoral nerve provocation test. Baseline:  Goal status: INITIAL PLAN:  PT FREQUENCY: 1-2x/week  PT DURATION: 8 weeks  PLANNED INTERVENTIONS: Therapeutic exercises, Therapeutic activity, Neuromuscular re-education, Balance training, Gait training, Patient/Family education, Self Care, Joint mobilization, Dry Needling, Spinal mobilization, and Manual therapy  PLAN FOR NEXT SESSION: Soft tissue mobilization lumbar musculature, lumbar mobilizations, hip strengthening, core strengthening, neural mobilization exercises  Edmonia Caprio, PT, PhD, DPT  04/03/2023, 4:27 PM

## 2023-04-07 NOTE — Progress Notes (Signed)
Xray confirms swelling and arthritis as suspected.

## 2023-04-09 ENCOUNTER — Ambulatory Visit: Payer: Medicare HMO | Admitting: Physical Therapy

## 2023-04-16 ENCOUNTER — Ambulatory Visit: Payer: Medicare HMO | Attending: Physician Assistant

## 2023-04-16 ENCOUNTER — Telehealth: Payer: Self-pay

## 2023-04-16 DIAGNOSIS — M25561 Pain in right knee: Secondary | ICD-10-CM | POA: Insufficient documentation

## 2023-04-16 DIAGNOSIS — M79651 Pain in right thigh: Secondary | ICD-10-CM | POA: Insufficient documentation

## 2023-04-16 DIAGNOSIS — G8929 Other chronic pain: Secondary | ICD-10-CM | POA: Insufficient documentation

## 2023-04-16 DIAGNOSIS — R262 Difficulty in walking, not elsewhere classified: Secondary | ICD-10-CM | POA: Insufficient documentation

## 2023-04-16 DIAGNOSIS — M1711 Unilateral primary osteoarthritis, right knee: Secondary | ICD-10-CM | POA: Insufficient documentation

## 2023-04-16 NOTE — Telephone Encounter (Signed)
LVM regarding missed PT appointment. Reviewed attendance policy and reminded patient of next scheduled visit.   Letitia Libra, PT, DPT, ATC 04/16/23 10:04 AM

## 2023-04-18 DIAGNOSIS — Z79899 Other long term (current) drug therapy: Secondary | ICD-10-CM | POA: Diagnosis not present

## 2023-04-18 DIAGNOSIS — M5416 Radiculopathy, lumbar region: Secondary | ICD-10-CM | POA: Diagnosis not present

## 2023-04-18 DIAGNOSIS — Z79891 Long term (current) use of opiate analgesic: Secondary | ICD-10-CM | POA: Diagnosis not present

## 2023-04-18 DIAGNOSIS — Z6829 Body mass index (BMI) 29.0-29.9, adult: Secondary | ICD-10-CM | POA: Diagnosis not present

## 2023-04-18 DIAGNOSIS — G8929 Other chronic pain: Secondary | ICD-10-CM | POA: Diagnosis not present

## 2023-04-23 ENCOUNTER — Ambulatory Visit: Payer: Medicare HMO

## 2023-04-24 ENCOUNTER — Ambulatory Visit: Payer: Medicare HMO

## 2023-04-24 DIAGNOSIS — Z79899 Other long term (current) drug therapy: Secondary | ICD-10-CM | POA: Diagnosis not present

## 2023-04-29 ENCOUNTER — Ambulatory Visit: Payer: Medicare HMO

## 2023-04-29 ENCOUNTER — Ambulatory Visit (INDEPENDENT_AMBULATORY_CARE_PROVIDER_SITE_OTHER): Payer: Medicare HMO | Admitting: Sports Medicine

## 2023-04-29 ENCOUNTER — Other Ambulatory Visit (INDEPENDENT_AMBULATORY_CARE_PROVIDER_SITE_OTHER): Payer: Medicare HMO

## 2023-04-29 ENCOUNTER — Encounter: Payer: Self-pay | Admitting: Sports Medicine

## 2023-04-29 DIAGNOSIS — M19011 Primary osteoarthritis, right shoulder: Secondary | ICD-10-CM | POA: Diagnosis not present

## 2023-04-29 DIAGNOSIS — M25511 Pain in right shoulder: Secondary | ICD-10-CM | POA: Insufficient documentation

## 2023-04-29 DIAGNOSIS — M4306 Spondylolysis, lumbar region: Secondary | ICD-10-CM

## 2023-04-29 DIAGNOSIS — M1711 Unilateral primary osteoarthritis, right knee: Secondary | ICD-10-CM

## 2023-04-29 DIAGNOSIS — G8929 Other chronic pain: Secondary | ICD-10-CM | POA: Diagnosis not present

## 2023-04-29 NOTE — Assessment & Plan Note (Signed)
Sophia Smith does have chronic low back pain, she has had plenty of epidurals in the past without much efficacy. She was working with a pain management provider but breached the narcotic contract so she is no longer going to get narcotics from them. She is on gabapentin, unable to take NSAIDs. I did suspect that her principal pain generator were her bilateral L3-L5 facet joints. In 2019 I ordered a facet medial branch block and ablation but she did not follow through. We will have her do some physical therapy first, and after 6 weeks of therapy if she is not doing much better I will go and set her up for bilateral L3-L5 facet joint medial branch blocks and radiofrequency ablation. Last MRI was 2020.

## 2023-04-29 NOTE — Progress Notes (Signed)
    Procedures performed today:    Procedure: Real-time Ultrasound Guided injection of the right knee Device: Samsung HS60  Verbal informed consent obtained.  Time-out conducted.  Noted no overlying erythema, induration, or other signs of local infection.  Skin prepped in a sterile fashion.  Local anesthesia: Topical Ethyl chloride.  With sterile technique and under real time ultrasound guidance: Effusion noted, 1 cc Kenalog 40, 2 cc lidocaine, 2 cc bupivacaine injected easily Completed without difficulty  Advised to call if fevers/chills, erythema, induration, drainage, or persistent bleeding.  Images permanently stored and available for review in PACS.  Impression: Technically successful ultrasound guided injection.  Independent interpretation of notes and tests performed by another provider:   None.  Brief History, Exam, Impression, and Recommendations:    Primary osteoarthritis of right knee Known tricompartmental osteoarthritis on x-rays, failed medications in the form of narcotics, today we will do a knee injection. If this fails we can talk about viscosupplementation.   Right shoulder pain Sharona also has pain right shoulder, she does have a history of adhesive capsulitis and had a manipulation under anesthesia years ago. Now with recurrence of pain, she thought it was adhesive capsulitis but with some distraction I am able to get her into about 75 degrees of external rotation which is not consistent with frozen shoulder, I think this is the likely rotator cuff tearing. She understands that this will need x-rays and aggressive physical therapy followed by injection if not fully better.   Lumbar spondylolysis Seleena does have chronic low back pain, she has had plenty of epidurals in the past without much efficacy. She was working with a pain management provider but breached the narcotic contract so she is no longer going to get narcotics from them. She is on gabapentin,  unable to take NSAIDs. I did suspect that her principal pain generator were her bilateral L3-L5 facet joints. In 2019 I ordered a facet medial branch block and ablation but she did not follow through. We will have her do some physical therapy first, and after 6 weeks of therapy if she is not doing much better I will go and set her up for bilateral L3-L5 facet joint medial branch blocks and radiofrequency ablation. Last MRI was 2020.    ____________________________________________ Ihor Austin. Benjamin Stain, M.D., ABFM., CAQSM., AME. Primary Care and Sports Medicine [facility]  Adjunct Professor of Family Medicine  [place] [employer] [employer]  Restaurant manager, fast food

## 2023-04-29 NOTE — Assessment & Plan Note (Signed)
Sophia Smith also has pain right shoulder, she does have a history of adhesive capsulitis and had a manipulation under anesthesia years ago. Now with recurrence of pain, she thought it was adhesive capsulitis but with some distraction I am able to get her into about 75 degrees of external rotation which is not consistent with frozen shoulder, I think this is the likely rotator cuff tearing. She understands that this will need x-rays and aggressive physical therapy followed by injection if not fully better.

## 2023-04-29 NOTE — Assessment & Plan Note (Signed)
Known tricompartmental osteoarthritis on x-rays, failed medications in the form of narcotics, today we will do a knee injection. If this fails we can talk about viscosupplementation.

## 2023-04-30 ENCOUNTER — Ambulatory Visit: Payer: Medicare HMO

## 2023-05-01 DIAGNOSIS — M1711 Unilateral primary osteoarthritis, right knee: Secondary | ICD-10-CM | POA: Diagnosis not present

## 2023-05-01 DIAGNOSIS — M4306 Spondylolysis, lumbar region: Secondary | ICD-10-CM | POA: Diagnosis not present

## 2023-05-01 DIAGNOSIS — G8929 Other chronic pain: Secondary | ICD-10-CM | POA: Diagnosis not present

## 2023-05-01 DIAGNOSIS — M25511 Pain in right shoulder: Secondary | ICD-10-CM | POA: Diagnosis not present

## 2023-05-01 MED ORDER — TRIAMCINOLONE ACETONIDE 40 MG/ML IJ SUSP
40.0000 mg | Freq: Once | INTRAMUSCULAR | Status: AC
Start: 2023-05-01 — End: 2023-05-01
  Administered 2023-05-01: 40 mg via INTRAMUSCULAR

## 2023-05-01 NOTE — Addendum Note (Signed)
Addended by: Carren Rang A on: 05/01/2023 01:52 PM   Modules accepted: Orders

## 2023-05-08 ENCOUNTER — Ambulatory Visit: Payer: Medicare HMO | Attending: Sports Medicine

## 2023-05-08 ENCOUNTER — Other Ambulatory Visit: Payer: Self-pay

## 2023-05-08 DIAGNOSIS — R262 Difficulty in walking, not elsewhere classified: Secondary | ICD-10-CM | POA: Diagnosis present

## 2023-05-08 DIAGNOSIS — G8929 Other chronic pain: Secondary | ICD-10-CM | POA: Diagnosis present

## 2023-05-08 DIAGNOSIS — M5459 Other low back pain: Secondary | ICD-10-CM | POA: Diagnosis present

## 2023-05-08 DIAGNOSIS — M79651 Pain in right thigh: Secondary | ICD-10-CM | POA: Insufficient documentation

## 2023-05-08 DIAGNOSIS — M25511 Pain in right shoulder: Secondary | ICD-10-CM | POA: Insufficient documentation

## 2023-05-08 DIAGNOSIS — M1711 Unilateral primary osteoarthritis, right knee: Secondary | ICD-10-CM | POA: Insufficient documentation

## 2023-05-08 DIAGNOSIS — M25561 Pain in right knee: Secondary | ICD-10-CM | POA: Insufficient documentation

## 2023-05-08 NOTE — Therapy (Addendum)
 OUTPATIENT PHYSICAL THERAPY LOWER EXTREMITY RE-EVALUATION PHYSICAL THERAPY DISCHARGE SUMMARY  Visits from Start of Care: 2  Current functional level related to goals / functional outcomes: See progress note for discharge status    Remaining deficits: Unknown    Education / Equipment: HEP    Patient agrees to discharge. Patient goals were not met. Patient is being discharged due to not returning since the last visit.  Sophia Smith PT, MPH 10/06/23 2:31 PM     Patient Name: Sophia Smith MRN: [id number] DOB:[birth year], 62 y.o., female Today's Date: 05/08/2023  END OF SESSION:  PT End of Session - 05/08/23 1456     Visit Number 2    Number of Visits 8    Date for PT Re-Evaluation 07/17/23    PT Start Time 1147    PT Stop Time 1240    PT Time Calculation (min) 53 min    Activity Tolerance Patient tolerated treatment well    Behavior During Therapy [facility] for tasks assessed/performed             Past Medical History:  Diagnosis Date   Anemia    Back disorder    Cellulitis of left lower leg 08/04/2017   Complication of anesthesia    was having a back surgery, prone position, patient vomitted, had to be intubated- no problems since that time.   Fibromyalgia    GERD (gastroesophageal reflux disease)    Heart murmur    History of hiatal hernia    Hypertension    Iron deficiency anemia    recieved IV iron.   Knee gives out    Other disorders of continuity of bone, right pelvic region and thigh    Stroke (HCC)    2 TIA's   Past Surgical History:  Procedure Laterality Date   ARTHRODESIS METATARSALPHALANGEAL JOINT (MTPJ) Right 08/03/2021   Procedure: FUSION RIGHT GREAT TOE METATARSALPHALANGEAL JOINT (MTPJ), WEIL OSTEOTOMY 2ND METATARSAL, PROXIMAL INERPHALANGEAL RESECTION 2ND AND 3RD TOES;  Surgeon: Nadara Mustard, MD;  Location: [facility] OR;  Service: Orthopedics;  Laterality: Right;   CHOLECYSTECTOMY     [place]  Right Carotid Right 2012   Dissecton with  pseudoanevrysm   GASTROCNEMIUS RECESSION Right 08/03/2021   Procedure: RIGHT GASTROCNEMIUS RECESSION;  Surgeon: Nadara Mustard, MD;  Location: [facility] OR;  Service: Orthopedics;  Laterality: Right;   LEG SURGERY Bilateral    numerous leg surgeries post accident age 62   SIGMOID RESECTION / RECTOPEXY     Traumatic amputation Right    5th toe, ran over by a lawn mower.  was a child.   Patient Active Problem List   Diagnosis Date Noted   Right shoulder pain 04/29/2023   Primary hypertension 04/01/2023   Primary osteoarthritis of right knee 03/31/2023   COPD exacerbation (HCC) 06/11/2022   No energy 06/11/2022   Contracture of right Achilles tendon    Hallux rigidus, right foot    Acquired claw toe of right foot    PCO (posterior capsular opacification) 09/28/2020   Venous stasis dermatitis of both lower extremities 09/05/2020   Edema of left ankle 09/05/2020   Centrilobular emphysema (HCC) 07/31/2020   Bunion of great toe of right foot 04/18/2020   Fall 03/27/2020   Current every day smoker 03/27/2020   Status post left knee replacement 03/22/2020   Panlobular emphysema (HCC) 01/25/2020   Pulmonary nodules 01/25/2020   Aortic atherosclerosis (HCC) 01/24/2020   Rib pain on left side 01/24/2020   Normocytic anemia 01/14/2020  Hypokalemia 01/14/2020   Dyslipidemia (high LDL; low HDL) 03/03/2019   Vertebral artery dissection (HCC) 09/01/2018   Tobacco abuse 09/01/2018   Plantar callus 08/13/2018   Chronic venous stasis 05/14/2018   Thumb pain, left 03/22/2018   Extensor carpi radialis brevis tenosynovitis 03/22/2018   Non-intractable vomiting with nausea 03/22/2018   Hiatal hernia 03/22/2018   Bilateral cataracts 11/04/2017   Disorder of refraction 11/04/2017   Mitral regurgitation 10/22/2017   Tricuspid regurgitation 10/22/2017   Left atrial enlargement 10/22/2017   Lumbar spondylolysis 09/23/2017   Drug-seeking behavior 09/19/2017   Essential hypertension 09/19/2017   Opioid  dependence with withdrawal (HCC) 09/19/2017   Chronic bilateral low back pain with bilateral sciatica 09/19/2017   Cigarette nicotine dependence without complication 09/19/2017   Iron deficiency anemia 05/13/2017   Internal hemorrhoid 12/05/2015   Insomnia secondary to chronic pain 06/01/2015   Upper back pain on right side 06/01/2015   Left ankle pain 09/13/2014   Plantar fasciitis, bilateral 03/21/2014   Bilateral lower extremity edema 10/26/2013   Left knee pain 10/26/2013   Tricompartment degenerative joint disease of knee 10/26/2013   Elevated alkaline phosphatase level 10/15/2013   Aneurysm artery, neck (HCC) 09/15/2013   Pelvic fracture (HCC) 09/15/2013   Depression 09/15/2013   Lymphedema of leg 09/13/2013   Pneumothorax on left 09/13/2013   Left rib fracture 09/13/2013   Fibromyalgia 09/13/2013   Lumbar disc disease 09/13/2013    PCP: Tandy Gaw, PA-C  REFERRING PROVIDER: Rodney Langton MD  REFERRING DIAG:  [phone] (ICD-10-CM) - Chronic pain of right knee  M17.11 (ICD-10-CM) - Tricompartment osteoarthritis of right knee   05/01/2023:  Diagnosis  M17.11 (ICD-10-CM) - Primary osteoarthritis of right knee    Right knee osteoarthritis, right shoulder rotator cuff syndrome, lumbar spondylosis   Evaluate and Treat. 1-2 times per week for 4-6 weeks. Decrease pain, increase strength, flexibility, function, and range of motion. Modalities may include, traction, ionto, phono, and stim. May include dry needling with or without stim.  THERAPY DIAG:  Difficulty in walking, not elsewhere classified  Pain in right thigh  Chronic pain of right knee  Acute pain of right shoulder  Other low back pain  Rationale for Evaluation and Treatment: Rehabilitation  ONSET DATE: Chronic  SUBJECTIVE:   SUBJECTIVE STATEMENT: Sophia Smith returned to physical therapy for her second visit since the initial evaluation on 04/03/2023 secondary to visit cancellations. She  returns today with similar complaints of chronic R knee and low back pain, however, her main concern today is a more recent onset of R shoulder pain. She is more interested in physical therapy for her R shoulder as she has had physical therapy for her R knee and low back in the past and has not noted much improvement. She has a history of R adhesive capsulitis years ago, but per her report, her physician believes this is more of a rotator cuff issue. Aniqua is getting pain with just about any use of her R arm. She is unable to sleep on her R side because of the pain. She did receive an ultrasound guided injection into her R knee and noted this did help with the anterior thigh pain she was having at the initial visit, but the R knee pain returned after about 4 days.  From eval: Aerianna described a chief complaint of R anterior thigh pain. She stated that her L knee was replaced 15 years ago due to advanced osteoarthritis and at that time she was told the R knee would  have to be done sooner than later. 9 years ago, she fell and broke her pelvis in two places, and following this, she developed fibromyalgia and disc bulges in her lower back. More recently, she has developed pain in her R anterior thigh. She thought it was just a muscle pull, but then it did not get better with time. 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 Physical therapy is the first intervention she has tried for this issue of R anterior thigh pain.   PERTINENT HISTORY: R foot surgery for bunion correction last year PAIN:  Are you having pain? Yes: NPRS scale: 0 at rest, 5 average, 8-9 at max/10 Pain location: R thigh Pain description: cramping, pulling Aggravating factors: Most often with weightbearing activities but can occur when lying in bed Relieving factors: Rest and elevation  PRECAUTIONS: None  RED FLAGS: None   WEIGHT BEARING RESTRICTIONS: No  FALLS:  Has patient  fallen in last 6 months? No  PATIENT GOALS: Increase comfort  OBJECTIVE:   DIAGNOSTIC FINDINGS:  R knee radiographs on 03/31/2023: FINDINGS: Moderate tricompartmental peripheral spurring. Mild spurring of the tibial spines. The joint spaces are preserved. The bones are subjectively under mineralized. There is a small knee joint effusion. No fracture, erosion or focal bone abnormality.  R shoulder radiographs taken on 04/29/2023: results not yet released  PATIENT SURVEYS:  FOTO Intake: 44, risk adjusted intake 46, predicted 55  COGNITION: Overall cognitive status: Within functional limits for tasks assessed     MUSCLE LENGTH: Hamstrings: Right 70 deg; Left 70 deg (in 90/90 position) Sidelying rectus femoris: R limited by R knee ROM, but reproduces R thigh pain - cervical flexion increased pain so could be R femoral nerve irritation  PALPATION: Palpation of the R lower lumbar paraspinals and quadratus lumborum radiates pain to the R anterior thigh; pain with palpation of the L posterior hip musculature but no radiating symptoms  05/08/2023: Pain/hypomobility with AP glides to R rib 3 Pain/hypomobility with PA glides to R ribs 1-8 Pain with palpation of the R subscapularis, R posterior rotator cuff Pain with palpation of the R brachial plexus - radiates to R shoulder  LOWER EXTREMITY ROM:  Passive ROM Right eval Left eval  Hip flexion WNL WNL  Hip extension Lacking 5 degrees to neutral Lacking 5 degrees to neutral  Hip abduction    Hip adduction    Hip internal rotation WNL WNL  Hip external rotation WNL WNL  Knee flexion 110 120  Knee extension 30 15  Ankle dorsiflexion    Ankle plantarflexion    Ankle inversion    Ankle eversion     (Blank rows = not tested)  LOWER EXTREMITY MMT:  MMT Right eval Left eval  Hip flexion 3+/5 3+/5  Hip extension 4-/5 4-/5  Hip abduction 3+/5 3+/5  Hip adduction 3+/5 3+/5  Hip internal rotation    Hip external rotation 3+/5  3+/5  Knee flexion 4/5 4/5  Knee extension 4/5 5/5  Ankle dorsiflexion    Ankle plantarflexion    Ankle inversion    Ankle eversion     (Blank rows = not tested)  UPPER EXTREMITY ROM:   Passive ROM Right 10/03 Left eval  Shoulder flexion 90 deg* WNL  Shoulder extension    Shoulder abduction 80 deg*   Shoulder adduction    Shoulder internal rotation    Shoulder external rotation 10 deg*  Elbow flexion    Elbow extension    Wrist flexion    Wrist extension    Wrist ulnar deviation    Wrist radial deviation    Wrist pronation    Wrist supination    (Blank rows = not tested)  AROM for R shoulder flexion and abduction < 90 degrees.  UPPER EXTREMITY MMT:  MMT Right 10/03 Left eval  Shoulder flexion Painful 4  Shoulder extension Painful 5  Shoulder abduction Painful 4  Shoulder adduction    Shoulder internal rotation Painful 5  Shoulder external rotation Painful 4  Middle trapezius    Lower trapezius    Elbow flexion    Elbow extension    Wrist flexion    Wrist extension    Wrist ulnar deviation    Wrist radial deviation    Wrist pronation    Wrist supination    Grip strength (lbs)    (Blank rows = not tested)  TODAY'S TREATMENT:                                                                                                                              [facility] Adult PT Treatment:                                                DATE: 05/08/2023 Therapeutic Exercise: Instructed home program: R shoulder distraction with strap below shoulder height, 10 x 10 sec holds Self mobilization for R anterior chest wall, R midscapular region, and R posterior shoulder using ball against wall Self soft tissue mobilization for R scalenes with contralateral sidebend Manual Therapy: Supine: Soft tissue mobilization: pin and stretch to R posterior rotator cuff and R subscapularis AP glides to R rib 3 PA glides to R ribs 1-8 Soft tissue mobilization to R scalenes  [facility] Adult PT  Treatment:                                                DATE: 04/03/2023  Therapeutic Exercise: Instructed: self soft tissue mobilization of the R lumbar paraspinals/quadratus lumborum in hooklying and in standing against a wall using a tennis ball  PATIENT EDUCATION:  Education details: Exam findings and plan of care Person educated: Patient Education method: Explanation and Demonstration Education comprehension: verbalized understanding  HOME EXERCISE PROGRAM: Access Code: [email or web address] URL: [email or web address] Date: 05/08/2023 Prepared by: Edmonia Caprio  Program Notes (1) self soft tissue mobilization of the R lumbar paraspinals/quadratus lumborum in hooklying and in standing against a wall using a tennis ball (2)  R shoulder distraction with strap below shoulder height, 10 x 10 sec holds (3) Self mobilization for R anterior chest wall, R midscapular region, and R  posterior shoulder using ball against wall (4) Self soft tissue mobilization for R scalenes with contralateral sidebend  ASSESSMENT:  CLINICAL IMPRESSION: Focus of re-evaluation today was the R shoulder per patient request. Noted pain with resisted testing of the rotator cuff in all directions, as well as many areas of restriction/hyperalgesia at the R shoulder complex. Patient likely has contractile pain from the R rotator cuff but pain was also reproduced at the R brachial plexus. Treatment today at the R thoracic outlet, R otator cuff, and R anterior/posterior rib cage improved R shoulder PROM to near equal to L, however, AROM of the R shoulder was still painful after treatment. Plan to continue addressing areas of pain generation around the R shoulder complex while progressively loading the R rotator cuff to reduce pain and improve function. Treatment of the R knee and lumbar region are still indicated, but will proceed based on patient preference. Will follow up in one week for now per patient request and determine  further plan of care at that visit. Will also extend the plan of care from the initial visit. Long term goals added for R shoulder.  From eval: Patient is a 62 y.o. who was seen today for physical therapy evaluation and treatment for R thigh pain. Symptoms were reproduced with R femoral nerve provocation testing and palpation at the R lower lumbar musculature, making referral from proximal structures most likely. The patient does have valgus deformity and significant loss of range of motion at the R knee likely related to advanced osteoarthritis, which is also a consideration if symptom do not fully improve with treatment to the lumbar spine. Physical therapy is indicated.   OBJECTIVE IMPAIRMENTS: Abnormal gait, difficulty walking, decreased ROM, decreased strength, impaired flexibility, and pain.   ACTIVITY LIMITATIONS: standing, squatting, and stairs  REHAB POTENTIAL: Good  CLINICAL DECISION MAKING: Evolving/moderate complexity  EVALUATION COMPLEXITY: Moderate   GOALS: Goals reviewed with patient? Yes  SHORT TERM GOALS: Target date:  05/01/2023  Patient will report at least a 50% improvement in chief complaint. Baseline: Goal status: INITIAL  2.  Patient will improve on FOTO questionnaire to at least 50. Baseline:  Goal status: INITIAL  3.  Palpation of the R lower lumbar musculature will not refer pain to the R anterior thigh. Baseline:  Goal status: INITIAL  LONG TERM GOALS: Target date: 05/29/2023  Patient will report at least a 50% improvement in R anterior thigh pain. Baseline:  Goal status: INITIAL  2.  Patient will improve FOTO score to at least 55. Baseline:  Goal status: INITIAL  3.  Patient will have a negative R femoral nerve provocation test. Baseline:  Goal status: INITIAL  4.  Patient will report at least a 50% improvement in R shoulder symptoms. Baseline:  Goal status: INITIAL  5. Patient will be able to actively elevate the R shoulder to at least 100  degrees without pain. Baseline:  Goal status: INITIAL PLAN:  PT FREQUENCY: 1-2x/week  PT DURATION: 8 weeks  PLANNED INTERVENTIONS: Therapeutic exercises, Therapeutic activity, Neuromuscular re-education, Balance training, Gait training, Patient/Family education, Self Care, Joint mobilization, Dry Needling, Spinal mobilization, and Manual therapy  PLAN FOR NEXT SESSION: Lower cervical spine mobilizations, soft tissue mobilization to R scalenes, AP/PA glides R rib cage, joint mobilizations R shoulder, scapular mobilizations R side, soft tissue mobilization to R rotator cuff and muscles of the R shoulder complex, progressive loading of R rotator cuff  From eval: Soft tissue mobilization lumbar musculature, lumbar mobilizations, hip strengthening, core strengthening,  neural mobilization exercises  Edmonia Caprio, PT, PhD, DPT  05/08/2023, 3:04 PM

## 2023-05-13 ENCOUNTER — Ambulatory Visit: Payer: Medicare HMO

## 2023-05-13 ENCOUNTER — Ambulatory Visit (INDEPENDENT_AMBULATORY_CARE_PROVIDER_SITE_OTHER): Payer: Medicare HMO | Admitting: Physician Assistant

## 2023-05-13 DIAGNOSIS — S299XXA Unspecified injury of thorax, initial encounter: Secondary | ICD-10-CM

## 2023-05-13 DIAGNOSIS — Z13820 Encounter for screening for osteoporosis: Secondary | ICD-10-CM | POA: Diagnosis not present

## 2023-05-13 DIAGNOSIS — F17200 Nicotine dependence, unspecified, uncomplicated: Secondary | ICD-10-CM

## 2023-05-13 DIAGNOSIS — S2242XA Multiple fractures of ribs, left side, initial encounter for closed fracture: Secondary | ICD-10-CM | POA: Diagnosis not present

## 2023-05-13 DIAGNOSIS — S2232XA Fracture of one rib, left side, initial encounter for closed fracture: Secondary | ICD-10-CM | POA: Diagnosis not present

## 2023-05-13 DIAGNOSIS — K449 Diaphragmatic hernia without obstruction or gangrene: Secondary | ICD-10-CM | POA: Diagnosis not present

## 2023-05-13 MED ORDER — KETOROLAC TROMETHAMINE 60 MG/2ML IM SOLN
60.0000 mg | Freq: Once | INTRAMUSCULAR | Status: AC
Start: 2023-05-13 — End: 2023-05-13
  Administered 2023-05-13: 60 mg via INTRAMUSCULAR

## 2023-05-13 NOTE — Patient Instructions (Signed)
Rib Contusion A rib contusion is a deep bruise on the rib area. Contusions are the result of a blunt trauma that causes bleeding and injury to the tissues under the skin. A rib contusion may involve bruising of the ribs and of the skin and muscles in the area. The skin over the contusion may turn blue, purple, or yellow. Minor injuries result in a painless contusion. More severe contusions may be painful and swollen for a few weeks. What are the causes? This condition is usually caused by a hard, direct hit to an area of the body. This often occurs while playing contact sports. What are the signs or symptoms? Symptoms of this condition include: Swelling and redness of the injured area. Discoloration of the injured area. Tenderness and soreness of the injured area. Pain with or without movement. Pain when breathing in. How is this diagnosed? This condition may be diagnosed based on: Your symptoms and medical history. A physical exam. Imaging tests--such as an X-ray, CT scan, or MRI--to determine if there were internal injuries or broken bones (fractures). How is this treated? This condition may be treated with: Rest. This is often the best treatment for a rib contusion. Ice packs. This reduces swelling and inflammation. Deep-breathing exercises. These may be recommended to reduce the risk for lung collapse and pneumonia. Medicines. Over-the-counter or prescription medicines may be given to control pain. Injection of a numbing medicine around the nerve near your injury (nerve block). Follow these instructions at home: Medicines Take over-the-counter and prescription medicines only as told by your health care provider. Ask your health care provider if the medicine prescribed to you: Requires you to avoid driving or using machinery. Can cause constipation. You may need to take these actions to prevent or treat constipation: Drink enough fluid to keep your urine pale yellow. Take  over-the-counter or prescription medicines. Eat foods that are high in fiber, such as beans, whole grains, and fresh fruits and vegetables. Limit foods that are high in fat and processed sugars, such as fried or sweet foods. Managing pain, stiffness, and swelling If directed, put ice on the injured area. To do this: Put ice in a plastic bag. Place a towel between your skin and the bag. Leave the ice on for 20 minutes, 2-3 times a day. Remove the ice if your skin turns bright red. This is very important. If you cannot feel pain, heat, or cold, you have a greater risk of damage to the area.  Activity Rest the injured area. Avoid strenuous activity and any activities or movements that cause pain. Be careful during activities, and avoid bumping the injured area. Do not lift anything that is heavier than 5 lb (2.3 kg), or the limit that you are told, until your health care provider says that it is safe. General instructions  Do not use any products that contain nicotine or tobacco, such as cigarettes, e-cigarettes, and chewing tobacco. These can delay healing. If you need help quitting, ask your health care provider. Do deep-breathing exercises as told by your health care provider. If you were given an incentive spirometer, use it every 1-2 hours while you are awake, or as recommended by your health care provider. This device measures how well you are filling your lungs with each breath. Keep all follow-up visits. This is important. Contact a health care provider if you have: Increased bruising or swelling. Pain that is not controlled with treatment. A fever. Get help right away if you: Have difficulty breathing or  shortness of breath. Develop a continual cough, or you cough up thick or bloody mucus from your lungs (sputum). Feel nauseous or you vomit. Have pain in your abdomen. These symptoms may represent a serious problem that is an emergency. Do not wait to see if the symptoms will go  away. Get medical help right away. Call your local emergency services (911 in the [place] Do not drive yourself to the hospital. Summary A rib contusion is a deep bruise on your rib area. Contusions are the result of a blunt trauma that causes bleeding and injury to the tissues under the skin. The skin over the contusion may turn blue, purple, or yellow. Minor injuries may cause a painless contusion. More severe contusions may be painful and swollen for a few weeks. Rest the injured area. Avoid strenuous activity and any activities or movements that cause pain. This information is not intended to replace advice given to you by your health care provider. Make sure you discuss any questions you have with your health care provider. Document Revised: 10/27/2019 Document Reviewed: 10/27/2019 Elsevier Patient Education  2024 [employer].

## 2023-05-14 ENCOUNTER — Encounter: Payer: Self-pay | Admitting: Physician Assistant

## 2023-05-14 NOTE — Progress Notes (Addendum)
Acute Office Visit  Subjective:     Patient ID: Sophia Smith, female    DOB: 29-May-1961, 62 y.o.   MRN: [id number]  Chief Complaint  Patient presents with   Rib Injury    Rib pain from fall    HPI Patient is in today for left rib pain after she hit her side on the counter while washing dishes. She states she turned to the side and her leg "gave out" which then made her hit her left side and left arm on the counter. She states the incident occurred at around 10:00 am this morning and she has had a dull aching pain under her left breast ever since. She rates the pain about a 3/10 at rest and a 7/10 with breathing or coughing. She has not tried anything to make it better. She denies LOC, nausea, vomiting, dizziness, vision changes, chest pain, and SOB.  ROS See HPI     Objective:    LMP 12/11/2012  BP Readings from Last 3 Encounters:  03/31/23 (!) 153/97  02/03/23 (!) 145/85  11/18/22 125/68      Physical Exam Constitutional:      Appearance: Normal appearance.  HENT:     Head: Normocephalic and atraumatic.     Right Ear: External ear normal.     Left Ear: External ear normal.     Nose: Nose normal.  Eyes:     Conjunctiva/sclera: Conjunctivae normal.  Cardiovascular:     Rate and Rhythm: Normal rate and regular rhythm.     Heart sounds: Normal heart sounds.  Pulmonary:     Effort: Pulmonary effort is normal.     Breath sounds: Normal breath sounds.  Chest:     Comments: Tenderness under left breast. No bruising or erythema. No bony deformities. Musculoskeletal:        General: No swelling.     Cervical back: Normal range of motion.  Skin:    General: Skin is warm and dry.     Findings: No bruising or erythema.  Neurological:     General: No focal deficit present.     Mental Status: She is alert and oriented to person, place, and time.  Psychiatric:        Mood and Affect: Mood normal.        Behavior: Behavior normal.         Assessment & Plan:   Sophia Smith is a 62 yo female who presents with left rib after hitting it on the counter.  Marland KitchenRosalita Smith was seen today for rib injury.  Diagnoses and all orders for this visit:  Rib injury -     DG Ribs Unilateral W/Chest Left; Future -     ketorolac (TORADOL) injection 60 mg -     DG Bone Density; Future  Current every day smoker -     DG Bone Density; Future  Osteoporosis screening -     DG Bone Density; Future  Closed fracture of one rib of left side, initial encounter -     DG Bone Density; Future   Rib contusion vs fracture CXR showed possible fracture at 3rd lateral left rib.   Marland Kitchen Possible acute left third lateral rib fracture. Old left sixth, seventh and eighth rib fractures. 2. No pneumothorax. 3. Large hiatal hernia. - Chest X ray ordered - findings include possible left lateral 3rd rib fracture   Toradol given for pain IM today Pt sees pain clinic and she can alert them with  new fracture Discussed deep breathing and avoiding secondary pneumonia Ice ribs Follow up as needed or if symptoms persist or worsen Needs bone density due to smoking history and new fracture

## 2023-05-14 NOTE — Progress Notes (Signed)
Possible lateral 3rd rib fracture but this is not over where your pain was in office. Fracture is very small. Can take 6 weeks to heal. Make sure taking good deep breaths in and out.  We do need to get bone density. Are you ok with going downstairs?   Bone density order in EMR.

## 2023-05-15 ENCOUNTER — Ambulatory Visit: Payer: Medicare HMO

## 2023-05-16 DIAGNOSIS — G8929 Other chronic pain: Secondary | ICD-10-CM | POA: Diagnosis not present

## 2023-05-16 DIAGNOSIS — F1120 Opioid dependence, uncomplicated: Secondary | ICD-10-CM | POA: Diagnosis not present

## 2023-05-16 DIAGNOSIS — M5416 Radiculopathy, lumbar region: Secondary | ICD-10-CM | POA: Diagnosis not present

## 2023-05-16 DIAGNOSIS — Z79899 Other long term (current) drug therapy: Secondary | ICD-10-CM | POA: Diagnosis not present

## 2023-05-23 ENCOUNTER — Other Ambulatory Visit: Payer: Medicare HMO

## 2023-05-27 ENCOUNTER — Ambulatory Visit (INDEPENDENT_AMBULATORY_CARE_PROVIDER_SITE_OTHER): Payer: Medicare HMO | Admitting: Physician Assistant

## 2023-05-27 VITALS — BP 148/102 | HR 91 | Ht 65.0 in | Wt 177.0 lb

## 2023-05-27 DIAGNOSIS — S2232XD Fracture of one rib, left side, subsequent encounter for fracture with routine healing: Secondary | ICD-10-CM

## 2023-05-27 DIAGNOSIS — J441 Chronic obstructive pulmonary disease with (acute) exacerbation: Secondary | ICD-10-CM | POA: Diagnosis not present

## 2023-05-27 MED ORDER — DOXYCYCLINE HYCLATE 100 MG PO TABS
100.0000 mg | ORAL_TABLET | Freq: Two times a day (BID) | ORAL | 0 refills | Status: DC
Start: 2023-05-27 — End: 2023-08-05

## 2023-05-27 MED ORDER — PREDNISONE 20 MG PO TABS
ORAL_TABLET | ORAL | 0 refills | Status: DC
Start: 2023-05-27 — End: 2023-08-05

## 2023-05-27 MED ORDER — KETOROLAC TROMETHAMINE 60 MG/2ML IM SOLN
60.0000 mg | Freq: Once | INTRAMUSCULAR | Status: AC
Start: 2023-05-27 — End: 2023-05-27
  Administered 2023-05-27: 60 mg via INTRAMUSCULAR

## 2023-05-27 NOTE — Patient Instructions (Addendum)
Increase gabapentin to 1200mg  three times a day.  Use lidocaine patches.  Start prednisone taper and doxycycline.

## 2023-05-27 NOTE — Progress Notes (Unsigned)
Established Patient Office Visit  Subjective   Patient ID: Sophia Smith, female    DOB: Oct 21, 1960  Age: 62 y.o. MRN: [id number]  Chief Complaint  Patient presents with   Medical Management of Chronic Issues    Left rib pain    HPI Pt is a 62 yo female with known chronic and pain and sees pain management but has had a recent left lower rib fracture with a lot of pain. She also is a current smoker and has COPD. She feels like she is also having a COPD exacerbation that is worsening her cough and pain. Pain at rest with no cough 2/10. Pain with cough and deep breathing 10/8. She is using her albuterol inhaler with some relief. No fever, chills, body aches.  .. Active Ambulatory Problems    Diagnosis Date Noted   Lymphedema of leg 09/13/2013   Pneumothorax on left 09/13/2013   Left rib fracture 09/13/2013   Fibromyalgia 09/13/2013   Lumbar disc disease 09/13/2013   Aneurysm artery, neck (HCC) 09/15/2013   Pelvic fracture (HCC) 09/15/2013   Depression 09/15/2013   Elevated alkaline phosphatase level 10/15/2013   Bilateral lower extremity edema 10/26/2013   Left knee pain 10/26/2013   Tricompartment degenerative joint disease of knee 10/26/2013   Plantar fasciitis, bilateral 03/21/2014   Left ankle pain 09/13/2014   Insomnia secondary to chronic pain 06/01/2015   Upper back pain on right side 06/01/2015   Internal hemorrhoid 12/05/2015   Iron deficiency anemia 05/13/2017   Drug-seeking behavior 09/19/2017   Essential hypertension 09/19/2017   Opioid dependence with withdrawal (HCC) 09/19/2017   Chronic bilateral low back pain with bilateral sciatica 09/19/2017   Cigarette nicotine dependence without complication 09/19/2017   Lumbar spondylolysis 09/23/2017   Mitral regurgitation 10/22/2017   Tricuspid regurgitation 10/22/2017   Left atrial enlargement 10/22/2017   Bilateral cataracts 11/04/2017   Disorder of refraction 11/04/2017   Thumb pain, left 03/22/2018   Extensor  carpi radialis brevis tenosynovitis 03/22/2018   Non-intractable vomiting with nausea 03/22/2018   Hiatal hernia 03/22/2018   Chronic venous stasis 05/14/2018   Plantar callus 08/13/2018   Vertebral artery dissection (HCC) 09/01/2018   Tobacco abuse 09/01/2018   Dyslipidemia (high LDL; low HDL) 03/03/2019   Normocytic anemia 01/14/2020   Hypokalemia 01/14/2020   Aortic atherosclerosis (HCC) 01/24/2020   Rib pain on left side 01/24/2020   Panlobular emphysema (HCC) 01/25/2020   Pulmonary nodules 01/25/2020   Status post left knee replacement 03/22/2020   Fall 03/27/2020   Current every day smoker 03/27/2020   Bunion of great toe of right foot 04/18/2020   Centrilobular emphysema (HCC) 07/31/2020   Venous stasis dermatitis of both lower extremities 09/05/2020   Edema of left ankle 09/05/2020   PCO (posterior capsular opacification) 09/28/2020   Contracture of right Achilles tendon    Hallux rigidus, right foot    Acquired claw toe of right foot    COPD exacerbation (HCC) 06/11/2022   No energy 06/11/2022   Primary osteoarthritis of right knee 03/31/2023   Primary hypertension 04/01/2023   Right shoulder pain 04/29/2023   Rib injury 05/13/2023   Resolved Ambulatory Problems    Diagnosis Date Noted   S/P total knee replacement 09/13/2014   Acute stress reaction 06/01/2015   Seroma, post-traumatic (HCC) 05/13/2017   Cellulitis of left lower leg 08/04/2017   Wound of left leg 08/04/2017   Past Medical History:  Diagnosis Date   Anemia    Back disorder  Complication of anesthesia    GERD (gastroesophageal reflux disease)    Heart murmur    History of hiatal hernia    Hypertension    Knee gives out    Other disorders of continuity of bone, right pelvic region and thigh    Stroke (HCC)     ROS   See HPI.  Objective:     BP (!) 148/102   Pulse 91   Ht 5\' 5"  (1.651 m)   Wt 177 lb (80.3 kg)   LMP 12/11/2012   SpO2 98%   BMI 29.45 kg/m  BP Readings from Last 3  Encounters:  05/27/23 (!) 148/102  03/31/23 (!) 153/97  02/03/23 (!) 145/85   Wt Readings from Last 3 Encounters:  05/27/23 177 lb (80.3 kg)  03/31/23 178 lb (80.7 kg)  02/03/23 175 lb (79.4 kg)      Physical Exam Constitutional:      Appearance: Normal appearance. She is obese.  HENT:     Head: Normocephalic.     Nose: Nose normal.     Mouth/Throat:     Mouth: Mucous membranes are moist.     Pharynx: No oropharyngeal exudate or posterior oropharyngeal erythema.  Eyes:     Conjunctiva/sclera: Conjunctivae normal.  Cardiovascular:     Rate and Rhythm: Normal rate and regular rhythm.  Pulmonary:     Effort: Pulmonary effort is normal.     Comments: Coarse breath sounds upper bilateral lungs.  Tenderness to palpation over anterior left lower chest to palpation.  Chest:     Chest wall: Tenderness present.  Musculoskeletal:     Cervical back: Normal range of motion and neck supple. No tenderness.     Right lower leg: No edema.     Left lower leg: No edema.  Lymphadenopathy:     Cervical: No cervical adenopathy.  Skin:    Comments: Bruise over left breast.   Neurological:     General: No focal deficit present.     Mental Status: She is alert and oriented to person, place, and time.  Psychiatric:        Mood and Affect: Mood normal.         Assessment & Plan:  [name] was seen today for medical management of chronic issues.  Diagnoses and all orders for this visit:  COPD exacerbation (HCC) -     predniSONE (DELTASONE) 20 MG tablet; Take 3 tablets for 3 days, take 2 tablets for 3 days, take 1 tablet for 3 days, take 1/2 tablet for 4 days. -     doxycycline (VIBRA-TABS) 100 MG tablet; Take 1 tablet (100 mg total) by mouth 2 (two) times daily.  Closed fracture of one rib of left side with routine healing, subsequent encounter -     ketorolac (TORADOL) injection 60 mg   Concern for COPD exacerbation worsening cough and worsening pain Start prednisone and  doxycycline Continue to use albuterol inhaler as needed Toradol shot given today in office for pain Keep same dosing schedule for oxycodone Consider increasing gabapentin temporarily to 1200mg  TID Pt has lidocaine patches at home that she can use Ice left fractured ribs Follow up as needed or if symptoms worsening    Tandy Gaw, PA-C

## 2023-05-28 ENCOUNTER — Encounter: Payer: Self-pay | Admitting: Physician Assistant

## 2023-06-10 ENCOUNTER — Ambulatory Visit: Payer: Medicare HMO | Admitting: Sports Medicine

## 2023-06-10 ENCOUNTER — Encounter: Payer: Self-pay | Admitting: Family

## 2023-06-13 ENCOUNTER — Telehealth: Payer: Self-pay | Admitting: Sports Medicine

## 2023-06-13 ENCOUNTER — Telehealth (INDEPENDENT_AMBULATORY_CARE_PROVIDER_SITE_OTHER): Payer: Medicare HMO | Admitting: Sports Medicine

## 2023-06-13 DIAGNOSIS — M5442 Lumbago with sciatica, left side: Secondary | ICD-10-CM | POA: Diagnosis not present

## 2023-06-13 DIAGNOSIS — M25511 Pain in right shoulder: Secondary | ICD-10-CM

## 2023-06-13 DIAGNOSIS — G8929 Other chronic pain: Secondary | ICD-10-CM | POA: Diagnosis not present

## 2023-06-13 DIAGNOSIS — M4306 Spondylolysis, lumbar region: Secondary | ICD-10-CM | POA: Diagnosis not present

## 2023-06-13 DIAGNOSIS — M1711 Unilateral primary osteoarthritis, right knee: Secondary | ICD-10-CM

## 2023-06-13 DIAGNOSIS — M5441 Lumbago with sciatica, right side: Secondary | ICD-10-CM | POA: Diagnosis not present

## 2023-06-13 NOTE — Telephone Encounter (Signed)
Right knee orthovisc please.

## 2023-06-13 NOTE — Progress Notes (Signed)
Virtual Visit via Telephone   I connected with  Olena Leatherwood  on 06/13/23 by telephone/telehealth and verified that I am speaking with the correct person using two identifiers.   I discussed the limitations, risks, security and privacy concerns of performing an evaluation and management service by telephone, including the higher likelihood of inaccurate diagnosis and treatment, and the availability of in person appointments.  We also discussed the likely need of an additional face to face encounter for complete and high quality delivery of care.  I also discussed with the patient that there may be a patient responsible charge related to this service. The patient expressed understanding and wishes to proceed.  Provider location is in medical facility. Patient location is at their home, different from provider location. People involved in care of the patient during this telehealth encounter were myself, my nurse/medical assistant, and my front office/scheduling team member.  Review of Systems: No fevers, chills, night sweats, weight loss, chest pain, or shortness of breath.   Objective Findings:    General: Speaking full sentences, no audible heavy breathing.  Sounds alert and appropriately interactive.    Independent interpretation of tests performed by another provider:   None.  Brief History, Exam, Impression, and Recommendations:    Chronic bilateral low back pain with bilateral sciatica Right L5 transforaminal  Lumbar spondylolysis Graison has chronic low back pain, she has had a few epidurals in the past without significant efficacy. She is working with the pain management provider but it sounds as though she breached the narcotic contract so no longer going to get narcotics from them. She is on gabapentin and unable to take NSAIDs. We initially suspected that her primary pain generator was bilateral L3-L5 facet joints which did appear quite arthritic on her imaging. The  initial plan was to do physical therapy and if insufficient improvement then to proceed with bilateral L3-L5 facet joint medial branch blocks and then potentially radiofrequency ablation. Today on the phone she is endorsing predominantly right-sided leg pain and what she describes as an L5 distribution to the bottom of the foot and back of the lower leg. In fact on her imaging the right L5 nerve does appear to be impinged. At this point, particularly radicular at this juncture we will proceed with a right L5-S1 transforaminal epidural.  Primary osteoarthritis of right knee Darl Pikes also has known tricompartmental osteoarthritis on x-rays, she did fill multiple medications including narcotics, we did a knee injection and she had approximately 5 days of excellent relief. Now that she is having recurrence of pain we will proceed with viscosupplementation. Hopefully we can get her approved between now and her appointment on the 18th, if so we can start right knee Orthovisc.  Right shoulder pain This pleasant 62 year old female returns in a telephone visit, she did have to help her mom today. She has a history of adhesive capsulitis and had a manipulation under anesthesia years ago, she then had a recurrence of pain thought to be adhesive capsulitis however I was able to get her into about 75 degrees of external rotation which is not consistent. We added some physical therapy, x-rays, the x-rays did show glenohumeral osteoarthritis, still having pain, she does have appointment to come to see me in person on the 18th and am happy to do a glenohumeral joint injection at that juncture.   I discussed the above assessment and treatment plan with the patient. The patient was provided an opportunity to ask questions and all were  answered. The patient agreed with the plan and demonstrated an understanding of the instructions.   The patient was advised to call back or seek an in-person evaluation if the symptoms  worsen or if the condition fails to improve as anticipated.   I provided 30 minutes of verbal and non-verbal time during this encounter date, time was needed to gather information, review chart, records, communicate/coordinate with staff remotely, as well as complete documentation.   ____________________________________________ Ihor Austin. Benjamin Stain, M.D., ABFM., CAQSM., AME. Primary Care and Sports Medicine  [facility]  Adjunct Professor of Family Medicine  [place] [employer]  Restaurant manager, fast food

## 2023-06-13 NOTE — Assessment & Plan Note (Addendum)
Darl Pikes also has known tricompartmental osteoarthritis on x-rays, she did fill multiple medications including narcotics, we did a knee injection and she had approximately 5 days of excellent relief. Now that she is having recurrence of pain we will proceed with viscosupplementation. Hopefully we can get her approved between now and her appointment on the 18th, if so we can start right knee Orthovisc.

## 2023-06-13 NOTE — Assessment & Plan Note (Signed)
Right L5 transforaminal

## 2023-06-13 NOTE — Assessment & Plan Note (Signed)
This pleasant 62 year old female returns in a telephone visit, she did have to help her mom today. She has a history of adhesive capsulitis and had a manipulation under anesthesia years ago, she then had a recurrence of pain thought to be adhesive capsulitis however I was able to get her into about 75 degrees of external rotation which is not consistent. We added some physical therapy, x-rays, the x-rays did show glenohumeral osteoarthritis, still having pain, she does have appointment to come to see me in person on the 18th and am happy to do a glenohumeral joint injection at that juncture.

## 2023-06-13 NOTE — Assessment & Plan Note (Addendum)
Sophia Smith has chronic low back pain, she has had a few epidurals in the past without significant efficacy. She is working with the pain management provider but it sounds as though she breached the narcotic contract so no longer going to get narcotics from them. She is on gabapentin and unable to take NSAIDs. We initially suspected that her primary pain generator was bilateral L3-L5 facet joints which did appear quite arthritic on her imaging. The initial plan was to do physical therapy and if insufficient improvement then to proceed with bilateral L3-L5 facet joint medial branch blocks and then potentially radiofrequency ablation. Today on the phone she is endorsing predominantly right-sided leg pain and what she describes as an L5 distribution to the bottom of the foot and back of the lower leg. In fact on her imaging the right L5 nerve does appear to be impinged. At this point, particularly radicular at this juncture we will proceed with a right L5-S1 transforaminal epidural.

## 2023-06-14 DIAGNOSIS — Z79899 Other long term (current) drug therapy: Secondary | ICD-10-CM | POA: Diagnosis not present

## 2023-06-14 DIAGNOSIS — F1120 Opioid dependence, uncomplicated: Secondary | ICD-10-CM | POA: Diagnosis not present

## 2023-06-14 DIAGNOSIS — M5416 Radiculopathy, lumbar region: Secondary | ICD-10-CM | POA: Diagnosis not present

## 2023-06-14 DIAGNOSIS — Z79891 Long term (current) use of opiate analgesic: Secondary | ICD-10-CM | POA: Diagnosis not present

## 2023-06-14 DIAGNOSIS — G8929 Other chronic pain: Secondary | ICD-10-CM | POA: Diagnosis not present

## 2023-06-17 NOTE — Telephone Encounter (Signed)
PA information submitted via [email or web address] for Orthovisc Paperwork has been printed and given to Dr. T for signatures. Once obtained, information will be faxed to [employer] at [phone]  

## 2023-06-18 NOTE — Telephone Encounter (Signed)
Patient notified and will come in on Monday.

## 2023-06-18 NOTE — Telephone Encounter (Signed)
Patient was approved tried calling unable to reach due to [employer] being full.

## 2023-06-19 ENCOUNTER — Encounter: Payer: Self-pay | Admitting: Sports Medicine

## 2023-06-24 NOTE — Discharge Instructions (Signed)

## 2023-06-25 ENCOUNTER — Ambulatory Visit
Admission: RE | Admit: 2023-06-25 | Discharge: 2023-06-25 | Disposition: A | Discharge status: Home / Self Care | Payer: Medicare HMO | Source: Ambulatory Visit | Attending: Sports Medicine | Admitting: Sports Medicine

## 2023-06-25 DIAGNOSIS — M4306 Spondylolysis, lumbar region: Secondary | ICD-10-CM

## 2023-06-25 DIAGNOSIS — M4727 Other spondylosis with radiculopathy, lumbosacral region: Secondary | ICD-10-CM | POA: Diagnosis not present

## 2023-06-25 MED ORDER — IOPAMIDOL (ISOVUE-M 200) INJECTION 41%
1.0000 mL | Freq: Once | INTRAMUSCULAR | Status: AC
  Administered 2023-06-25: 1 mL via EPIDURAL

## 2023-06-25 MED ORDER — METHYLPREDNISOLONE ACETATE 40 MG/ML INJ SUSP (RADIOLOG
80.0000 mg | Freq: Once | INTRAMUSCULAR | Status: AC
  Administered 2023-06-25: 80 mg via EPIDURAL

## 2023-06-30 ENCOUNTER — Ambulatory Visit: Payer: Medicare HMO | Admitting: Sports Medicine

## 2023-07-02 ENCOUNTER — Other Ambulatory Visit: Payer: Self-pay

## 2023-07-02 ENCOUNTER — Ambulatory Visit: Payer: Medicare HMO | Admitting: Sports Medicine

## 2023-07-02 ENCOUNTER — Other Ambulatory Visit (INDEPENDENT_AMBULATORY_CARE_PROVIDER_SITE_OTHER): Payer: Medicare HMO

## 2023-07-02 ENCOUNTER — Encounter: Payer: Self-pay | Admitting: Sports Medicine

## 2023-07-02 DIAGNOSIS — M25511 Pain in right shoulder: Secondary | ICD-10-CM

## 2023-07-02 DIAGNOSIS — M19011 Primary osteoarthritis, right shoulder: Secondary | ICD-10-CM

## 2023-07-02 DIAGNOSIS — G8929 Other chronic pain: Secondary | ICD-10-CM

## 2023-07-02 DIAGNOSIS — M1711 Unilateral primary osteoarthritis, right knee: Secondary | ICD-10-CM

## 2023-07-02 MED ORDER — HYALURONAN 30 MG/2ML IX SOSY
30.0000 mg | PREFILLED_SYRINGE | Freq: Once | INTRA_ARTICULAR | Status: AC
  Administered 2023-07-02: 30 mg via INTRA_ARTICULAR

## 2023-07-02 MED ORDER — TRIAMCINOLONE ACETONIDE 40 MG/ML IJ SUSP
40.0000 mg | Freq: Once | INTRAMUSCULAR | Status: AC
  Administered 2023-07-02: 40 mg via INTRAMUSCULAR

## 2023-07-02 NOTE — Progress Notes (Signed)
    Procedures performed today:    Procedure: Real-time Ultrasound Guided aspiration/injection of the right knee Device: Samsung HS60  Verbal informed consent obtained.  Time-out conducted.  Noted no overlying erythema, induration, or other signs of local infection.  Skin prepped in a sterile fashion.  Local anesthesia: Topical Ethyl chloride.  With sterile technique and under real time ultrasound guidance: Using 18-gauge needle I aspirated 22 mL of clear, straw-colored fluid, syringe switched and 30 mg/2 mL of OrthoVisc (sodium hyaluronate) in a prefilled syringe was injected easily into the knee. Completed without difficulty  Advised to call if fevers/chills, erythema, induration, drainage, or persistent bleeding.  Images permanently stored and available for review in PACS.  Impression: Technically successful ultrasound guided injection.  Procedure: Real-time Ultrasound Guided injection of the right glenohumeral joint Device: Samsung HS60  Verbal informed consent obtained.  Time-out conducted.  Noted no overlying erythema, induration, or other signs of local infection.  Skin prepped in a sterile fashion.  Local anesthesia: Topical Ethyl chloride.  With sterile technique and under real time ultrasound guidance: Noted arthritic joint, 1 cc Kenalog 40, 2 cc lidocaine, 2 cc bupivacaine injected easily Completed without difficulty  Advised to call if fevers/chills, erythema, induration, drainage, or persistent bleeding.  Images permanently stored and available for review in PACS.  Impression: Technically successful ultrasound guided injection.  Independent interpretation of notes and tests performed by another provider:   None.  Brief History, Exam, Impression, and Recommendations:    Primary osteoarthritis of right knee Known tricompartmental osteoarthritis on x-rays, she has failed multiple medications including narcotics, steroid injection provided only 5 days of relief, we did  get Visco approved, starting Orthovisc No. 1 of 4, return in 1 week for #2 of 4 right knee.  Primary osteoarthritis of right shoulder This is a very pleasant 62 year old female returns, she has chronic right shoulder pain, she does have a history of adhesive capsulitis that responded to manipulation under anesthesia, at the last visit she had good motion, x-rays showed osteoarthritis. Today we did a glenohumeral joint injection, she had good immediate relief, return to see me in 6 weeks for this.    ____________________________________________ Ihor Austin. Benjamin Stain, M.D., ABFM., CAQSM., AME. Primary Care and Sports Medicine [facility]  Adjunct Professor of Family Medicine  [place] [employer] [facility] [employer]  Restaurant manager, fast food

## 2023-07-02 NOTE — Addendum Note (Signed)
Addended by: Carren Rang A on: 07/02/2023 03:35 PM   Modules accepted: Orders

## 2023-07-02 NOTE — Assessment & Plan Note (Signed)
This is a very pleasant 62 year old female returns, she has chronic right shoulder pain, she does have a history of adhesive capsulitis that responded to manipulation under anesthesia, at the last visit she had good motion, x-rays showed osteoarthritis. Today we did a glenohumeral joint injection, she had good immediate relief, return to see me in 6 weeks for this.

## 2023-07-02 NOTE — Assessment & Plan Note (Signed)
Known tricompartmental osteoarthritis on x-rays, she has failed multiple medications including narcotics, steroid injection provided only 5 days of relief, we did get Visco approved, starting Orthovisc No. 1 of 4, return in 1 week for #2 of 4 right knee.

## 2023-07-10 ENCOUNTER — Other Ambulatory Visit (INDEPENDENT_AMBULATORY_CARE_PROVIDER_SITE_OTHER): Payer: Medicare HMO

## 2023-07-10 ENCOUNTER — Ambulatory Visit: Payer: Medicare HMO | Admitting: Sports Medicine

## 2023-07-10 DIAGNOSIS — M1711 Unilateral primary osteoarthritis, right knee: Secondary | ICD-10-CM | POA: Diagnosis not present

## 2023-07-10 MED ORDER — HYALURONAN 30 MG/2ML IX SOSY
30.0000 mg | PREFILLED_SYRINGE | Freq: Once | INTRA_ARTICULAR | Status: AC
  Administered 2023-07-10: 30 mg via INTRA_ARTICULAR

## 2023-07-10 NOTE — Assessment & Plan Note (Signed)
Pleasant 62 year old female, tricompartmental osteoarthritis on x-rays, failed multiple interventions including narcotics, steroid injection provided temporary relief, today we did Orthovisc No. 2 of 4, return in 1 week for #3 of 4 right knee.

## 2023-07-10 NOTE — Progress Notes (Signed)
    Procedures performed today:    Procedure: Real-time Ultrasound Guided injection of the right knee Device: Samsung HS60  Verbal informed consent obtained.  Time-out conducted.  Noted no overlying erythema, induration, or other signs of local infection.  Skin prepped in a sterile fashion.  Local anesthesia: Topical Ethyl chloride.  With sterile technique and under real time ultrasound guidance: Moderate effusion noted, 30 mg/2 mL of OrthoVisc (sodium hyaluronate) in a prefilled syringe was injected easily into the knee through a 22-gauge needle. Completed without difficulty  Advised to call if fevers/chills, erythema, induration, drainage, or persistent bleeding.  Images permanently stored and available for review in PACS.  Impression: Technically successful ultrasound guided injection.  Independent interpretation of notes and tests performed by another provider:   None.  Brief History, Exam, Impression, and Recommendations:    Primary osteoarthritis of right knee Pleasant 62 year old female, tricompartmental osteoarthritis on x-rays, failed multiple interventions including narcotics, steroid injection provided temporary relief, today we did Orthovisc No. 2 of 4, return in 1 week for #3 of 4 right knee.    ____________________________________________ Ihor Austin. Benjamin Stain, M.D., ABFM., CAQSM., AME. Primary Care and Sports Medicine [facility]  Adjunct Professor of Family Medicine  [place] [employer] [facility] [employer]  Restaurant manager, fast food

## 2023-07-10 NOTE — Addendum Note (Signed)
Addended by: Carren Rang A on: 07/10/2023 03:48 PM   Modules accepted: Orders

## 2023-07-12 DIAGNOSIS — M5416 Radiculopathy, lumbar region: Secondary | ICD-10-CM | POA: Diagnosis not present

## 2023-07-12 DIAGNOSIS — G8929 Other chronic pain: Secondary | ICD-10-CM | POA: Diagnosis not present

## 2023-07-12 DIAGNOSIS — Z79899 Other long term (current) drug therapy: Secondary | ICD-10-CM | POA: Diagnosis not present

## 2023-07-12 DIAGNOSIS — Z79891 Long term (current) use of opiate analgesic: Secondary | ICD-10-CM | POA: Diagnosis not present

## 2023-07-17 ENCOUNTER — Ambulatory Visit: Payer: Medicare HMO | Admitting: Sports Medicine

## 2023-07-17 ENCOUNTER — Other Ambulatory Visit (INDEPENDENT_AMBULATORY_CARE_PROVIDER_SITE_OTHER): Payer: Medicare HMO

## 2023-07-17 DIAGNOSIS — M1711 Unilateral primary osteoarthritis, right knee: Secondary | ICD-10-CM

## 2023-07-17 MED ORDER — HYALURONAN 30 MG/2ML IX SOSY
30.0000 mg | PREFILLED_SYRINGE | Freq: Once | INTRA_ARTICULAR | Status: AC
  Administered 2023-07-17: 30 mg via INTRA_ARTICULAR

## 2023-07-17 NOTE — Progress Notes (Signed)
    Procedures performed today:    Procedure: Real-time Ultrasound Guided injection of the right knee Device: Samsung HS60  Verbal informed consent obtained.  Time-out conducted.  Noted no overlying erythema, induration, or other signs of local infection.  Skin prepped in a sterile fashion.  Local anesthesia: Topical Ethyl chloride.  With sterile technique and under real time ultrasound guidance: Moderate effusion noted, 30 mg/2 mL of OrthoVisc (sodium hyaluronate) in a prefilled syringe was injected easily into the knee through a 22-gauge needle. Completed without difficulty  Advised to call if fevers/chills, erythema, induration, drainage, or persistent bleeding.  Images permanently stored and available for review in PACS.  Impression: Technically successful ultrasound guided injection.  Independent interpretation of notes and tests performed by another provider:   None.  Brief History, Exam, Impression, and Recommendations:    Primary osteoarthritis of right knee Orthovisc 3 of 4 right knee, still not noting much improvement. Has failed multiple other interventions including narcotics and steroid injections.    ____________________________________________ Ihor Austin. Benjamin Stain, M.D., ABFM., CAQSM., AME. Primary Care and Sports Medicine [facility]  Adjunct Professor of Family Medicine  [place] [employer] [facility] [employer]  Restaurant manager, fast food

## 2023-07-17 NOTE — Assessment & Plan Note (Signed)
Orthovisc 3 of 4 right knee, still not noting much improvement. Has failed multiple other interventions including narcotics and steroid injections.

## 2023-07-17 NOTE — Addendum Note (Signed)
Addended by: Carren Rang A on: 07/17/2023 04:33 PM   Modules accepted: Orders

## 2023-07-21 DIAGNOSIS — Z961 Presence of intraocular lens: Secondary | ICD-10-CM | POA: Diagnosis not present

## 2023-07-21 DIAGNOSIS — H52223 Regular astigmatism, bilateral: Secondary | ICD-10-CM | POA: Diagnosis not present

## 2023-07-21 DIAGNOSIS — H524 Presbyopia: Secondary | ICD-10-CM | POA: Diagnosis not present

## 2023-07-24 ENCOUNTER — Ambulatory Visit: Payer: Medicare HMO | Admitting: Sports Medicine

## 2023-07-24 ENCOUNTER — Other Ambulatory Visit (INDEPENDENT_AMBULATORY_CARE_PROVIDER_SITE_OTHER): Payer: Medicare HMO

## 2023-07-24 DIAGNOSIS — M19011 Primary osteoarthritis, right shoulder: Secondary | ICD-10-CM

## 2023-07-24 DIAGNOSIS — M1711 Unilateral primary osteoarthritis, right knee: Secondary | ICD-10-CM | POA: Diagnosis not present

## 2023-07-24 DIAGNOSIS — M4306 Spondylolysis, lumbar region: Secondary | ICD-10-CM

## 2023-07-24 MED ORDER — HYALURONAN 30 MG/2ML IX SOSY
30.0000 mg | PREFILLED_SYRINGE | Freq: Once | INTRA_ARTICULAR | Status: AC
  Administered 2023-07-24: 30 mg via INTRA_ARTICULAR

## 2023-07-24 NOTE — Assessment & Plan Note (Signed)
Also with chronic right shoulder pain, we did an injection approximately 3 weeks ago, she had good short-term relief for a few weeks, due to persistence of pain and end-stage shoulder arthritis her neck step is shoulder arthroplasty, she will think about it and we can discuss this in further details at a future visit.

## 2023-07-24 NOTE — Assessment & Plan Note (Signed)
Today we did Orthovisc No. 4 of 4 right knee, she did have a mild effusion, still having some pain but doing better. I explained to her that after shot 3 we expect some relief but that full efficacy typically comes approximately 4 to 6 weeks after we finished the last shot. I will see her back in 4 to 6 weeks and we can consider geniculate artery embolization if insufficient improvement. Brochure given.

## 2023-07-24 NOTE — Assessment & Plan Note (Signed)
Sophia Smith does have chronic low back pain, she has had some epidurals in the past without significant efficacy. She is also working with pain management but it sounds like she breached the narcotic contract so is no longer going to get narcotics from them. Also on gabapentin and unable to take NSAIDs. We initially suspected that the primary pain generator was the bilateral L3-L5 facets. They did appear quite arthritic on her imaging, however on a phone visit she endorsed predominantly right-sided leg pain in an L5 distribution so we did proceed with a right L5-S1 transforaminal epidural. This did provide good relief, we will repeat, she understands she can get 3 injections stacked monthly.

## 2023-07-24 NOTE — Progress Notes (Signed)
    Procedures performed today:    Procedure: Real-time Ultrasound Guided injection of the right knee Device: Samsung HS60  Verbal informed consent obtained.  Time-out conducted.  Noted no overlying erythema, induration, or other signs of local infection.  Skin prepped in a sterile fashion.  Local anesthesia: Topical Ethyl chloride.  With sterile technique and under real time ultrasound guidance: Moderate effusion noted, 30 mg/2 mL of OrthoVisc (sodium hyaluronate) in a prefilled syringe was injected easily into the knee through a 22-gauge needle. Completed without difficulty  Advised to call if fevers/chills, erythema, induration, drainage, or persistent bleeding.  Images permanently stored and available for review in PACS.  Impression: Technically successful ultrasound guided injection.  Independent interpretation of notes and tests performed by another provider:   None.  Brief History, Exam, Impression, and Recommendations:    Primary osteoarthritis of right knee Today we did Orthovisc No. 4 of 4 right knee, she did have a mild effusion, still having some pain but doing better. I explained to her that after shot 3 we expect some relief but that full efficacy typically comes approximately 4 to 6 weeks after we finished the last shot. I will see her back in 4 to 6 weeks and we can consider geniculate artery embolization if insufficient improvement. Brochure given.  Lumbar spondylolysis Vanessia does have chronic low back pain, she has had some epidurals in the past without significant efficacy. She is also working with pain management but it sounds like she breached the narcotic contract so is no longer going to get narcotics from them. Also on gabapentin and unable to take NSAIDs. We initially suspected that the primary pain generator was the bilateral L3-L5 facets. They did appear quite arthritic on her imaging, however on a phone visit she endorsed predominantly right-sided leg  pain in an L5 distribution so we did proceed with a right L5-S1 transforaminal epidural. This did provide good relief, we will repeat, she understands she can get 3 injections stacked monthly.  Primary osteoarthritis of right shoulder Also with chronic right shoulder pain, we did an injection approximately 3 weeks ago, she had good short-term relief for a few weeks, due to persistence of pain and end-stage shoulder arthritis her neck step is shoulder arthroplasty, she will think about it and we can discuss this in further details at a future visit.  I spent 40 minutes of total time managing this patient today, this includes chart review, face to face, and non-face to face time.  Patient had a lot of questions and they were all answered.  ____________________________________________ Ihor Austin. Benjamin Stain, M.D., ABFM., CAQSM., AME. Primary Care and Sports Medicine [place] [facility]  Adjunct Professor of Family Medicine  [place] [employer] [facility] [employer]  Restaurant manager, fast food

## 2023-07-24 NOTE — Addendum Note (Signed)
Addended by: Carren Rang A on: 07/24/2023 04:36 PM   Modules accepted: Orders

## 2023-07-24 NOTE — Clinical Documentation Improvement (Signed)
done

## 2023-08-05 ENCOUNTER — Ambulatory Visit (INDEPENDENT_AMBULATORY_CARE_PROVIDER_SITE_OTHER): Payer: Medicare HMO | Admitting: Family Medicine

## 2023-08-05 ENCOUNTER — Encounter: Payer: Self-pay | Admitting: Family Medicine

## 2023-08-05 ENCOUNTER — Encounter: Payer: Self-pay | Admitting: Sports Medicine

## 2023-08-05 VITALS — BP 134/87 | HR 100 | Ht 65.0 in | Wt 173.0 lb

## 2023-08-05 DIAGNOSIS — J329 Chronic sinusitis, unspecified: Secondary | ICD-10-CM | POA: Diagnosis not present

## 2023-08-05 DIAGNOSIS — J40 Bronchitis, not specified as acute or chronic: Secondary | ICD-10-CM

## 2023-08-05 DIAGNOSIS — J441 Chronic obstructive pulmonary disease with (acute) exacerbation: Secondary | ICD-10-CM | POA: Diagnosis not present

## 2023-08-05 MED ORDER — PREDNISONE 20 MG PO TABS: 40.0000 mg | ORAL_TABLET | Freq: Every day | ORAL | 0 refills | Status: DC

## 2023-08-05 MED ORDER — DOXYCYCLINE HYCLATE 100 MG PO TABS: 100.0000 mg | ORAL_TABLET | Freq: Two times a day (BID) | ORAL | 0 refills | Status: DC

## 2023-08-05 NOTE — Progress Notes (Signed)
 Acute Office Visit  Subjective:     Patient ID: Sophia Smith, female    DOB: 10/24/1960, 62 y.o.   MRN: [id number]  Chief Complaint  Patient presents with   Sinusitis    She reports that her sxs started on Sunday she has been taking OTC sinus medications and this hasn't helped. She feels that this is now moving down into her chest. Denies any f/s/c/n/v    HPI Patient is in today for sinus congestion pressure that started on Sunday about 3 days ago.  She said that she feels like it starting to move in her chest.  No fevers chills or sweats she says she has had a lot of pressure in both of her ears but no sore throat.  No nausea vomiting or diarrhea.  She had several sick family members that she was around over the holidays.  She says several of them tested negative for COVID. + smoker  ROS      Objective:    BP 134/87   Pulse 100   Ht 5' 5 (1.651 m)   Wt 173 lb (78.5 kg)   LMP 12/11/2012   SpO2 96%   BMI 28.79 kg/m    Physical Exam Constitutional:      Appearance: Normal appearance.  HENT:     Head: Normocephalic and atraumatic.     Right Ear: Tympanic membrane, ear canal and external ear normal. There is no impacted cerumen.     Left Ear: Tympanic membrane, ear canal and external ear normal. There is no impacted cerumen.     Nose: Nose normal.     Mouth/Throat:     Pharynx: Oropharynx is clear.  Eyes:     Conjunctiva/sclera: Conjunctivae normal.  Cardiovascular:     Rate and Rhythm: Normal rate and regular rhythm.  Pulmonary:     Effort: Pulmonary effort is normal.     Breath sounds: Rhonchi present.     Comments: Lateral rhonchi improved after deep cough.  No crackles. Musculoskeletal:     Cervical back: Neck supple. No tenderness.  Lymphadenopathy:     Cervical: No cervical adenopathy.  Skin:    General: Skin is warm and dry.  Neurological:     Mental Status: She is alert and oriented to person, place, and time.  Psychiatric:        Mood and Affect:  Mood normal.     No results found for any visits on 08/05/23.      Assessment & Plan:   Problem List Items Addressed This Visit       Respiratory   COPD exacerbation (HCC)   Relevant Medications   doxycycline  (VIBRA -TABS) 100 MG tablet   predniSONE  (DELTASONE ) 20 MG tablet   Other Visit Diagnoses       Sinobronchitis    -  Primary   Relevant Medications   doxycycline  (VIBRA -TABS) 100 MG tablet   predniSONE  (DELTASONE ) 20 MG tablet      Sinobronchitis with COPD exacerbation.  Will go ahead and treat with Doxy and prednisone  encouraged her to bump up her use of her Ventolin  and call us  if she is not feeling better in 1 week.  Meds ordered this encounter  Medications   doxycycline  (VIBRA -TABS) 100 MG tablet    Sig: Take 1 tablet (100 mg total) by mouth 2 (two) times daily.    Dispense:  14 tablet    Refill:  0   predniSONE  (DELTASONE ) 20 MG tablet    Sig: Take  2 tablets (40 mg total) by mouth daily with breakfast.    Dispense:  10 tablet    Refill:  0    Return if symptoms worsen or fail to improve.  Dorothyann Byars, MD

## 2023-08-05 NOTE — Patient Instructions (Signed)
Use your Ventolin as needed.  Recommend starting with 2 puffs 3 times a day and as you are feeling better can taper down to twice a day and then once a day.

## 2023-08-09 DIAGNOSIS — Z79899 Other long term (current) drug therapy: Secondary | ICD-10-CM | POA: Diagnosis not present

## 2023-08-09 DIAGNOSIS — M51369 Other intervertebral disc degeneration, lumbar region without mention of lumbar back pain or lower extremity pain: Secondary | ICD-10-CM | POA: Diagnosis not present

## 2023-08-09 DIAGNOSIS — M5416 Radiculopathy, lumbar region: Secondary | ICD-10-CM | POA: Diagnosis not present

## 2023-08-09 DIAGNOSIS — Z79891 Long term (current) use of opiate analgesic: Secondary | ICD-10-CM | POA: Diagnosis not present

## 2023-08-09 DIAGNOSIS — G8929 Other chronic pain: Secondary | ICD-10-CM | POA: Diagnosis not present

## 2023-08-11 NOTE — Discharge Instructions (Signed)

## 2023-08-12 ENCOUNTER — Telehealth: Payer: Self-pay

## 2023-08-12 ENCOUNTER — Inpatient Hospital Stay
Admission: RE | Admit: 2023-08-12 | Discharge: 2023-08-12 | Disposition: A | Discharge status: Home / Self Care | Payer: Medicare HMO | Source: Ambulatory Visit | Attending: Sports Medicine | Admitting: Sports Medicine

## 2023-08-12 MED ORDER — TRIAZOLAM 0.25 MG PO TABS: ORAL_TABLET | ORAL | 0 refills | Status: DC

## 2023-08-12 NOTE — Telephone Encounter (Signed)
 Left message advising

## 2023-08-12 NOTE — Telephone Encounter (Signed)
 Triazolam sent, she will need a driver

## 2023-08-12 NOTE — Telephone Encounter (Signed)
 Copied from CRM [id number]. Topic: Clinical - Medication Question >> Aug 11, 2023  1:06 PM Powell B wrote: Reason for CRM: Patient calling because having DG Epidural/Nerve Root (Order [id number]) on 1/7. Patient would like PCP to send medication to pharmacy for pain/relaxation before procedure if possible, please call mobile.

## 2023-08-20 NOTE — Discharge Instructions (Signed)

## 2023-08-21 ENCOUNTER — Ambulatory Visit
Admission: RE | Admit: 2023-08-21 | Discharge: 2023-08-21 | Disposition: A | Discharge status: Home / Self Care | Payer: Medicare HMO | Source: Ambulatory Visit | Attending: Sports Medicine | Admitting: Sports Medicine

## 2023-08-21 DIAGNOSIS — M4727 Other spondylosis with radiculopathy, lumbosacral region: Secondary | ICD-10-CM | POA: Diagnosis not present

## 2023-08-21 DIAGNOSIS — M4306 Spondylolysis, lumbar region: Secondary | ICD-10-CM

## 2023-08-21 MED ORDER — IOPAMIDOL (ISOVUE-M 200) INJECTION 41%
1.0000 mL | Freq: Once | INTRAMUSCULAR | Status: AC
  Administered 2023-08-21: 1 mL via EPIDURAL

## 2023-08-21 MED ORDER — METHYLPREDNISOLONE ACETATE 40 MG/ML INJ SUSP (RADIOLOG
80.0000 mg | Freq: Once | INTRAMUSCULAR | Status: AC
  Administered 2023-08-21: 80 mg via EPIDURAL

## 2023-09-04 ENCOUNTER — Ambulatory Visit: Payer: Medicare HMO | Admitting: Sports Medicine

## 2023-09-06 DIAGNOSIS — Z79891 Long term (current) use of opiate analgesic: Secondary | ICD-10-CM | POA: Diagnosis not present

## 2023-09-06 DIAGNOSIS — M51369 Other intervertebral disc degeneration, lumbar region without mention of lumbar back pain or lower extremity pain: Secondary | ICD-10-CM | POA: Diagnosis not present

## 2023-09-06 DIAGNOSIS — G8929 Other chronic pain: Secondary | ICD-10-CM | POA: Diagnosis not present

## 2023-09-06 DIAGNOSIS — Z79899 Other long term (current) drug therapy: Secondary | ICD-10-CM | POA: Diagnosis not present

## 2023-09-06 DIAGNOSIS — M5416 Radiculopathy, lumbar region: Secondary | ICD-10-CM | POA: Diagnosis not present

## 2023-09-09 ENCOUNTER — Ambulatory Visit (INDEPENDENT_AMBULATORY_CARE_PROVIDER_SITE_OTHER): Payer: Medicare HMO | Admitting: Sports Medicine

## 2023-09-09 DIAGNOSIS — G894 Chronic pain syndrome: Secondary | ICD-10-CM | POA: Diagnosis not present

## 2023-09-09 DIAGNOSIS — M47816 Spondylosis without myelopathy or radiculopathy, lumbar region: Secondary | ICD-10-CM

## 2023-09-09 DIAGNOSIS — M1711 Unilateral primary osteoarthritis, right knee: Secondary | ICD-10-CM

## 2023-09-09 NOTE — Progress Notes (Signed)
    Procedures performed today:    None.  Independent interpretation of notes and tests performed by another provider:   None.  Brief History, Exam, Impression, and Recommendations:    Chronic pain syndrome Chronic pain syndrome, currently seeing Dr. Curtistine Pringle with [facility] for narcotic treatment.  Lumbar spondylosis Sophia Smith returns, she has multifactorial chronic low back pain, she had some epidurals in the past without significant efficacy, she is also working with a pain management provider now and does get some oxycodone . On gabapentin and unable to take NSAIDs. My initial suspicion was the primary pain generator or her very arthritic bilateral L3-L5 facet joints, however she did endorse some right sided leg pain with an L5 distribution so we proceeded with a right L5-S1 transforaminal epidural, she had initial good relief but her second epidural did not provide any relief. For this reason we will proceed with bilateral L3-L5 facet MBB and then RFA if successful. She does understand that at this point we are getting somewhat outside of my specialty and more towards the pain management realm.  Primary osteoarthritis of right knee Sophia Smith has now had an entire series of Orthovisc, multiple steroid injections, activity modification, medication and continues to have knee pain. Though some of this may be related to her chronic pain syndrome I do think it is reasonable to attempt geniculate artery embolization. Referral to Dr. Jennefer.    ____________________________________________ Sophia Smith, M.D., ABFM., CAQSM., AME. Primary Care and Sports Medicine [facility]  Adjunct Professor of [facility] Medicine  [employer]  Restaurant Manager, [employer]

## 2023-09-09 NOTE — Assessment & Plan Note (Signed)
 Sophia Smith returns, she has multifactorial chronic low back pain, she had some epidurals in the past without significant efficacy, she is also working with a pain management provider now and does get some oxycodone . On gabapentin and unable to take NSAIDs. My initial suspicion was the primary pain generator or her very arthritic bilateral L3-L5 facet joints, however she did endorse some right sided leg pain with an L5 distribution so we proceeded with a right L5-S1 transforaminal epidural, she had initial good relief but her second epidural did not provide any relief. For this reason we will proceed with bilateral L3-L5 facet MBB and then RFA if successful. She does understand that at this point we are getting somewhat outside of my specialty and more towards the pain management realm.

## 2023-09-09 NOTE — Assessment & Plan Note (Signed)
 Sophia Smith has now had an entire series of Orthovisc, multiple steroid injections, activity modification, medication and continues to have knee pain. Though some of this may be related to her chronic pain syndrome I do think it is reasonable to attempt geniculate artery embolization. Referral to Dr. Jennefer.

## 2023-09-09 NOTE — Assessment & Plan Note (Signed)
Chronic pain syndrome, currently seeing Dr. Emmaline Life with [facility] for narcotic treatment.

## 2023-09-26 NOTE — Progress Notes (Signed)
 Chief Complaint: Patient was seen in consultation today for right knee pain   Referring Physician(s): Thekkekandam,Thomas J  History of Present Illness: Sophia Smith is a 63 y.o. female with a medical history significant for HTN, COPD, tobacco use, depression, fibromyalgia, vertebral artery dissection, mitral and tricuspid regurgitation and chronic pain followed by a pain clinic. She also has a history of lumbar spondylosis with chronic low back pain and primary osteoarthritis of the right knee. She has had the entire Orthovisc series, multiple steroid injections, activity modification and pain medicine all to no avail. She is followed by Dr. Benjamin Stain and he thinks some of her pain may be related to her chronic pain syndrome but he thinks it is reasonable to attempt geniculate artery embolization. She has been referred to Interventional Radiology for further discussion.   Her pain is most severe when climbing stairs and standing.  She has extreme morning stiffness in the right knee.  The pain makes multiple activities of daily living difficult, most extreme with going shopping in stores, putting on socks, getting out of the bed, getting on and off the toilet, and heavy housework.  She takes oxycodone for pain, managed by a pain clinic.  This does not help her knee much.  She had her left knee replaced approximately 15 years ago.  She wants to do whatever she can to avoid a right TKA with fear of the recovery process.  Womac Pain Score = 71/96 VAS Pain Score = 8/10  She is a retired Designer, jewellery, with experience in Interventional Radiology.  Past Medical History:  Diagnosis Date   Anemia    Back disorder    Cellulitis of left lower leg 08/04/2017   Complication of anesthesia    was having a back surgery, prone position, patient vomitted, had to be intubated- no problems since that time.   Fibromyalgia    GERD (gastroesophageal reflux disease)    Heart murmur    History of  hiatal hernia    Hypertension    Iron deficiency anemia    recieved IV iron.   Knee gives out    Other disorders of continuity of bone, right pelvic region and thigh    Stroke (HCC)    2 TIA's    Past Surgical History:  Procedure Laterality Date   ARTHRODESIS METATARSALPHALANGEAL JOINT (MTPJ) Right 08/03/2021   Procedure: FUSION RIGHT GREAT TOE METATARSALPHALANGEAL JOINT (MTPJ), WEIL OSTEOTOMY 2ND METATARSAL, PROXIMAL INERPHALANGEAL RESECTION 2ND AND 3RD TOES;  Surgeon: Nadara Mustard, MD;  Location: [facility] OR;  Service: Orthopedics;  Laterality: Right;   CHOLECYSTECTOMY     [place]  Right Carotid Right 2012   Dissecton with pseudoanevrysm   GASTROCNEMIUS RECESSION Right 08/03/2021   Procedure: RIGHT GASTROCNEMIUS RECESSION;  Surgeon: Nadara Mustard, MD;  Location: [facility] OR;  Service: Orthopedics;  Laterality: Right;   LEG SURGERY Bilateral    numerous leg surgeries post accident age 58   SIGMOID RESECTION / RECTOPEXY     Traumatic amputation Right    5th toe, ran over by a lawn mower.  was a child.    Allergies: Nsaids  Medications: Prior to Admission medications   Medication Sig Start Date End Date Taking? Authorizing Provider  albuterol (VENTOLIN HFA) 108 (90 Base) MCG/ACT inhaler Inhale 2 puffs into the lungs every 4 (four) hours as needed for wheezing or shortness of breath. 06/11/22   Breeback, Jade L, PA-C  amitriptyline (ELAVIL) 150 MG tablet TAKE ONE TABLET BY MOUTH AT BEDTIME Patient  taking differently: Take 150 mg by mouth at bedtime. 04/14/20   Breeback, Jade L, PA-C  amLODipine (NORVASC) 10 MG tablet Take 1 tablet (10 mg total) by mouth daily. 04/01/23   Breeback, Jade L, PA-C  atorvastatin (LIPITOR) 40 MG tablet TAKE ONE TABLET BY MOUTH DAILY 01/31/22   Breeback, Jade L, PA-C  cyclobenzaprine (FLEXERIL) 10 MG tablet Take 1 tablet (10 mg total) by mouth at bedtime. 06/25/22   Breeback, Lesly Rubenstein L, PA-C  diclofenac sodium (VOLTAREN) 1 % GEL Apply 4 g topically 4 (four) times  daily. Patient taking differently: Apply 4 g topically daily as needed (hip pain). 11/15/13   Breeback, Lonna Cobb, PA-C  doxycycline (VIBRA-TABS) 100 MG tablet Take 1 tablet (100 mg total) by mouth 2 (two) times daily. 08/05/23   Agapito Games, MD  DULoxetine (CYMBALTA) 60 MG capsule Take 1 capsule (60 mg total) by mouth 2 (two) times daily. 04/01/23   Breeback, Lesly Rubenstein L, PA-C  furosemide (LASIX) 40 MG tablet Take 1 tablet (40 mg total) by mouth daily. As needed for lower extremity edema. Patient taking differently: Take 40 mg by mouth daily as needed for fluid or edema. 08/31/18   Breeback, Jade L, PA-C  gabapentin (NEURONTIN) 300 MG capsule Take 900 mg by mouth 3 (three) times daily. 07/10/20   [provider]  hydrochlorothiazide (HYDRODIURIL) 12.5 MG tablet Take 1 tablet (12.5 mg total) by mouth daily. 04/01/23   Breeback, Jade L, PA-C  NARCAN 4 MG/0.1ML LIQD nasal spray kit Place 1 spray into the nose once. 07/22/20   [provider]  oxyCODONE-acetaminophen (PERCOCET) 10-325 MG tablet Take by mouth. 11/07/21   [provider]  pantoprazole (PROTONIX) 40 MG tablet TAKE ONE TABLET BY MOUTH DAILY Patient taking differently: Take 40 mg by mouth daily. 09/14/19   Breeback, Jade L, PA-C  potassium chloride SA (KLOR-CON) 20 MEQ tablet Take 2 tablets (40 mEq total) by mouth daily. Patient taking differently: Take 40 mEq by mouth daily as needed (Only take lasix). 01/14/20   Monica Becton, MD  predniSONE (DELTASONE) 20 MG tablet Take 2 tablets (40 mg total) by mouth daily with breakfast. 08/05/23   Agapito Games, MD  triamcinolone (KENALOG) 0.1 % Apply 1 application topically 2 (two) times daily. Combine with Eucerin over dry/scaly areas. Patient taking differently: Apply 1 application  topically daily as needed (dry skin). Combine with Eucerin over dry/scaly areas. 09/05/20   Breeback, Jade L, PA-C  triazolam (HALCION) 0.25 MG tablet 1-2 tabs PO 2 hours before  procedure or imaging.  Do not drive with this medication. 08/12/23   Monica Becton, MD     Family History  Problem Relation Age of Onset   Breast cancer Mother    Heart attack Father    Cancer Father    Breast cancer Sister    Breast cancer Maternal Grandmother     Social History   Socioeconomic History   Marital status: Divorced    Spouse name: Not on file   Number of children: 1   Years of education: 14   Highest education level: Associate degree: academic program  Occupational History   Occupation: nurse    Comment: Disabled  Tobacco Use   Smoking status: Every Day    Current packs/day: 0.50    Average packs/day: 0.5 packs/day for 40.0 years (20.0 ttl pk-yrs)    Types: Cigarettes   Smokeless tobacco: Never  Vaping Use   Vaping status: Never Used  Substance and Sexual  Activity   Alcohol use: No   Drug use: No   Sexual activity: Not Currently  Other Topics Concern   Not on file  Social History Narrative   Lives with her daughter. She enjoys reading.   Social Drivers of Corporate investment banker Strain: Low Risk  (02/11/2023)   Overall Financial Resource Strain (CARDIA)    Difficulty of Paying Living Expenses: Not hard at all  Recent Concern: Financial Resource Strain - Medium Risk (11/29/2022)   Received from [facility], [facility]   Overall Financial Resource Strain (CARDIA)    Difficulty of Paying Living Expenses: Somewhat hard  Food Insecurity: No Food Insecurity (02/11/2023)   Hunger Vital Sign    Worried About Running Out of Food in the Last Year: Never true    Ran Out of Food in the Last Year: Never true  Recent Concern: Food Insecurity - Food Insecurity Present (11/29/2022)   Received from [facility], [facility]   Hunger Vital Sign    Worried About Running Out of Food in the Last Year: Sometimes true    Ran Out of Food in the Last Year: Sometimes true  Transportation Needs: No Transportation Needs (02/11/2023)   PRAPARE - Therapist, art (Medical): No    Lack of Transportation (Non-Medical): No  Physical Activity: Inactive (02/11/2023)   Exercise Vital Sign    Days of Exercise per Week: 0 days    Minutes of Exercise per Session: 0 min  Stress: No Stress Concern Present (02/11/2023)   [employer] of Occupational Health - Occupational Stress Questionnaire    Feeling of Stress : Not at all  Social Connections: Moderately Integrated (02/11/2023)   Social Connection and Isolation Panel [NHANES]    Frequency of Communication with Friends and Family: More than three times a week    Frequency of Social Gatherings with Friends and Family: More than three times a week    Attends Religious Services: More than 4 times per year    Active Member of [employer] or Organizations: Yes    Attends Engineer, structural: More than 4 times per year    Marital Status: Divorced  Recent Concern: Social Connections - Moderately Isolated (02/11/2023)   Social Connection and Isolation Panel [NHANES]    Frequency of Communication with Friends and Family: More than three times a week    Frequency of Social Gatherings with Friends and Family: More than three times a week    Attends Religious Services: More than 4 times per year    Active Member of [employer] or Organizations: No    Attends Banker Meetings: Never    Marital Status: Divorced    Review of Systems: A 12 point ROS discussed and pertinent positives are indicated in the HPI above.  All other systems are negative.   Vital Signs: LMP 12/11/2012    Physical Exam Constitutional:      General: She is not in acute distress. HENT:     Head: Normocephalic.     Mouth/Throat:     Mouth: Mucous membranes are moist.  Eyes:     General: No scleral icterus. Cardiovascular:     Rate and Rhythm: Normal rate and regular rhythm.  Pulmonary:     Effort: No respiratory distress.  Abdominal:     General: There is no distension.  Musculoskeletal:     Right  lower leg: No edema.     Left lower leg: No edema.  Legs:     Comments: Soft tissue prominence, tender to palpation.  Skin:    General: Skin is warm.     Capillary Refill: Capillary refill takes more than 3 seconds.  Neurological:     Mental Status: She is alert and oriented to person, place, and time.     Imaging: Right knee radiograph 03/31/23  Kellgren and Lawrence Grade II   Labs:  CBC: Recent Labs    10/14/22 1146  WBC 7.0  HGB 14.5  HCT 43.7  PLT 293    Assessment and Plan: 63 year old female with a history of severe right knee pain (WOMAC 71/96) secondary to moderate osteoarthritis (K&G 2) refractory to conservative treatments. She would be an excellent candidate for geniculate artery embolization.  We discussed the rationale, periprocedural expectations including risks and benefits, and long term expected outcomes after geniculate artery embolization.  She would like to proceed.  Plan for right geniculate artery embolization via antegrade right femoral artery approach with moderate sedation at [facility].  Marliss Coots, MD Pager: [phone]    I spent a total of  40 Minutes   in face to face in clinical consultation, greater than 50% of which was counseling/coordinating care for right knee pain.

## 2023-09-29 ENCOUNTER — Ambulatory Visit
Admission: RE | Admit: 2023-09-29 | Discharge: 2023-09-29 | Disposition: A | Discharge status: Home / Self Care | Payer: Medicare HMO | Source: Ambulatory Visit | Attending: Sports Medicine | Admitting: Sports Medicine

## 2023-09-29 DIAGNOSIS — M1711 Unilateral primary osteoarthritis, right knee: Secondary | ICD-10-CM

## 2023-09-29 DIAGNOSIS — M25561 Pain in right knee: Secondary | ICD-10-CM | POA: Diagnosis not present

## 2023-09-29 HISTORY — PX: IR RADIOLOGIST EVAL & MGMT: IMG5224

## 2023-10-03 DIAGNOSIS — M5416 Radiculopathy, lumbar region: Secondary | ICD-10-CM | POA: Diagnosis not present

## 2023-10-03 DIAGNOSIS — Z79899 Other long term (current) drug therapy: Secondary | ICD-10-CM | POA: Diagnosis not present

## 2023-10-03 DIAGNOSIS — Z79891 Long term (current) use of opiate analgesic: Secondary | ICD-10-CM | POA: Diagnosis not present

## 2023-10-03 DIAGNOSIS — G8929 Other chronic pain: Secondary | ICD-10-CM | POA: Diagnosis not present

## 2023-10-03 DIAGNOSIS — M51369 Other intervertebral disc degeneration, lumbar region without mention of lumbar back pain or lower extremity pain: Secondary | ICD-10-CM | POA: Diagnosis not present

## 2023-10-10 DIAGNOSIS — Z79899 Other long term (current) drug therapy: Secondary | ICD-10-CM | POA: Diagnosis not present

## 2023-10-15 ENCOUNTER — Ambulatory Visit: Payer: Medicare HMO

## 2023-10-17 ENCOUNTER — Ambulatory Visit

## 2023-10-17 DIAGNOSIS — Z122 Encounter for screening for malignant neoplasm of respiratory organs: Secondary | ICD-10-CM

## 2023-10-17 DIAGNOSIS — Z87891 Personal history of nicotine dependence: Secondary | ICD-10-CM

## 2023-10-17 DIAGNOSIS — F17210 Nicotine dependence, cigarettes, uncomplicated: Secondary | ICD-10-CM | POA: Diagnosis not present

## 2023-10-31 DIAGNOSIS — Z79891 Long term (current) use of opiate analgesic: Secondary | ICD-10-CM | POA: Diagnosis not present

## 2023-10-31 DIAGNOSIS — M5416 Radiculopathy, lumbar region: Secondary | ICD-10-CM | POA: Diagnosis not present

## 2023-10-31 DIAGNOSIS — M51369 Other intervertebral disc degeneration, lumbar region without mention of lumbar back pain or lower extremity pain: Secondary | ICD-10-CM | POA: Diagnosis not present

## 2023-10-31 DIAGNOSIS — Z79899 Other long term (current) drug therapy: Secondary | ICD-10-CM | POA: Diagnosis not present

## 2023-10-31 DIAGNOSIS — G8929 Other chronic pain: Secondary | ICD-10-CM | POA: Diagnosis not present

## 2023-11-06 DIAGNOSIS — Z79899 Other long term (current) drug therapy: Secondary | ICD-10-CM | POA: Diagnosis not present

## 2023-11-13 ENCOUNTER — Other Ambulatory Visit: Payer: Self-pay

## 2023-11-13 DIAGNOSIS — Z122 Encounter for screening for malignant neoplasm of respiratory organs: Secondary | ICD-10-CM

## 2023-11-13 DIAGNOSIS — F17210 Nicotine dependence, cigarettes, uncomplicated: Secondary | ICD-10-CM

## 2023-11-13 DIAGNOSIS — Z87891 Personal history of nicotine dependence: Secondary | ICD-10-CM

## 2023-11-29 DIAGNOSIS — M5416 Radiculopathy, lumbar region: Secondary | ICD-10-CM | POA: Diagnosis not present

## 2023-11-29 DIAGNOSIS — M51369 Other intervertebral disc degeneration, lumbar region without mention of lumbar back pain or lower extremity pain: Secondary | ICD-10-CM | POA: Diagnosis not present

## 2023-11-29 DIAGNOSIS — Z79899 Other long term (current) drug therapy: Secondary | ICD-10-CM | POA: Diagnosis not present

## 2023-11-29 DIAGNOSIS — G8929 Other chronic pain: Secondary | ICD-10-CM | POA: Diagnosis not present

## 2023-11-29 DIAGNOSIS — Z79891 Long term (current) use of opiate analgesic: Secondary | ICD-10-CM | POA: Diagnosis not present

## 2023-12-01 ENCOUNTER — Other Ambulatory Visit: Payer: Self-pay | Admitting: Physician Assistant

## 2023-12-01 DIAGNOSIS — E782 Mixed hyperlipidemia: Secondary | ICD-10-CM

## 2023-12-04 DIAGNOSIS — Z79899 Other long term (current) drug therapy: Secondary | ICD-10-CM | POA: Diagnosis not present

## 2023-12-22 ENCOUNTER — Telehealth: Payer: Self-pay

## 2023-12-22 ENCOUNTER — Other Ambulatory Visit: Payer: Self-pay | Admitting: Interventional Radiology

## 2023-12-22 DIAGNOSIS — M1711 Unilateral primary osteoarthritis, right knee: Secondary | ICD-10-CM

## 2023-12-22 NOTE — Progress Notes (Signed)
 Confirmed pharmacy with patient and called in dosepak order.  Pt is aware to pick this up but not to take it until after her procedure.  I told her to arrive [phone] instead of [phone], to have a driver, to wear comfortable shoes and pants and not to eat/drink after midnight

## 2023-12-26 ENCOUNTER — Ambulatory Visit
Admission: RE | Admit: 2023-12-26 | Discharge: 2023-12-26 | Disposition: A | Discharge status: Home / Self Care | Source: Ambulatory Visit | Attending: Interventional Radiology | Admitting: Interventional Radiology

## 2023-12-26 DIAGNOSIS — M1711 Unilateral primary osteoarthritis, right knee: Secondary | ICD-10-CM | POA: Diagnosis not present

## 2023-12-26 DIAGNOSIS — I798 Other disorders of arteries, arterioles and capillaries in diseases classified elsewhere: Secondary | ICD-10-CM | POA: Diagnosis not present

## 2023-12-26 HISTORY — PX: IR EMBO ARTERIAL NOT HEMORR HEMANG INC GUIDE ROADMAPPING: IMG5448

## 2023-12-26 MED ORDER — ONDANSETRON HCL 4 MG/2ML IJ SOLN: 4.0000 mg | Freq: Once | INTRAMUSCULAR | Status: DC

## 2023-12-26 MED ORDER — SODIUM CHLORIDE 0.9 % IV SOLN: INTRAVENOUS | Status: DC

## 2023-12-26 MED ORDER — NITROGLYCERIN 1 MG/10 ML FOR IR/CATH LAB
INTRA_ARTERIAL | Status: AC | PRN
  Administered 2023-12-26: 100 ug via INTRA_ARTERIAL

## 2023-12-26 MED ORDER — FENTANYL CITRATE PF 50 MCG/ML IJ SOSY: 25.0000 ug | PREFILLED_SYRINGE | INTRAMUSCULAR | Status: DC | PRN

## 2023-12-26 MED ORDER — FENTANYL CITRATE (PF) 100 MCG/2ML IJ SOLN
INTRAMUSCULAR | Status: AC | PRN
  Administered 2023-12-26: 50 ug via INTRAVENOUS
  Administered 2023-12-26 (×2): 25 ug via INTRAVENOUS
  Administered 2023-12-26 (×2): 50 ug via INTRAVENOUS

## 2023-12-26 MED ORDER — IODIXANOL 270 MG/ML IV SOLN
100.0000 mL | Freq: Once | INTRAVENOUS | Status: AC
  Administered 2023-12-26: 80 mL

## 2023-12-26 MED ORDER — MIDAZOLAM HCL 2 MG/2ML IJ SOLN: 1.0000 mg | INTRAMUSCULAR | Status: DC | PRN

## 2023-12-26 MED ORDER — KETOROLAC TROMETHAMINE 30 MG/ML IJ SOLN
30.0000 mg | Freq: Once | INTRAMUSCULAR | Status: AC
  Administered 2023-12-26: 30 mg via INTRAVENOUS

## 2023-12-26 MED ORDER — LIDOCAINE-EPINEPHRINE 1 %-1:100000 IJ SOLN
10.0000 mL | Freq: Once | INTRAMUSCULAR | Status: AC
  Administered 2023-12-26: 10 mL via INTRADERMAL

## 2023-12-26 MED ORDER — DEXAMETHASONE SODIUM PHOSPHATE 10 MG/ML IJ SOLN
10.0000 mg | Freq: Once | INTRAMUSCULAR | Status: AC
  Administered 2023-12-26: 10 mg via INTRAVENOUS

## 2023-12-26 MED ORDER — ACETAMINOPHEN 10 MG/ML IV SOLN
1000.0000 mg | Freq: Once | INTRAVENOUS | Status: AC
  Administered 2023-12-26: 1000 mg via INTRAVENOUS

## 2023-12-26 MED ORDER — MIDAZOLAM HCL 2 MG/2ML IJ SOLN
INTRAMUSCULAR | Status: AC | PRN
  Administered 2023-12-26 (×4): 1 mg via INTRAVENOUS

## 2023-12-26 NOTE — Discharge Instructions (Signed)
  Discharge Instructions for Genicular Artery Embolization (GAE)   Post-Procedure Care   Activity:   Rest for the remainder of the day.   Avoid strenuous activities and heavy lifting for 48 hours.   Gradually resume normal activities as tolerated.   Pain Management:   You may experience mild pain or discomfort at the catheter insertion site or in the knee. This is normal.   Use over-the-counter pain relievers such as acetaminophen  (Tylenol ) or ibuprofen (Advil) as directed.   Apply an ice pack to the knee for 15-20 minutes every 2-3 hours to reduce swelling and discomfort.   Take Sol-Medrol  Pack as directed per pharmacy.   Wound Care:   Keep the catheter insertion site clean and dry.   Remove the bandage after 24 hours and replace it with a clean, dry bandage if needed.   Avoid soaking in baths, hot tubs, or swimming pools for 5 days. Showers are allowed.   Medications:   Take prescribed medications as directed.   If you were taking blood thinners, follow your physician's instructions on when to resume them.   Diet:   Resume your normal diet.   Drink plenty of fluids to stay hydrated.   Follow-Up:   Schedule a follow-up appointment with your physician as instructed.   Contact your physician if you experience increased pain, swelling, redness, or drainage at the insertion site, or if you have a fever over 100.54F (38C).   When to Seek Immediate Medical Attention   Call [phone] with any concerns:   Signs of infection at the catheter site (redness, warmth, pus).   Sudden weakness or numbness in the leg.   If you need to speak to someone after business hours (5:00pm), please call the on-call Interventional Radiologist at [phone]. Tell them you are a patient of Dr. Aleen Huron and you had a GAE today, along with any issues you are having.      Call 911 if:   Difficulty breathing or chest pain.   You have severe pain in your abdomen, and it does not  get better with medicine.     You have leg pain or leg swelling.   You feel dizzy, or you faint.   Do not wait to see if the symptoms will go away.   Do not drive yourself to the hospital.   Please ensure you follow these instructions carefully and reach out to your healthcare provider if you have any concerns or questions. Wishing you a smooth and speedy recovery!

## 2023-12-26 NOTE — Procedures (Signed)
 Interventional Radiology Procedure Note  Procedure: Right geniculate artery embolization  Findings: Please refer to procedural dictation for full description. GAE via right superior medial geniculate artery.  0.2 mL 250 micron embozene.  Right proximal SFA 4 Fr access, manual compression hemostasis.  Complications: None immediate  Estimated Blood Loss:  <5 mL  Recommendations: IR will arrange 1 month outpatient follow up.   Creasie Doctor, MD

## 2023-12-27 DIAGNOSIS — F1120 Opioid dependence, uncomplicated: Secondary | ICD-10-CM | POA: Diagnosis not present

## 2023-12-27 DIAGNOSIS — M5416 Radiculopathy, lumbar region: Secondary | ICD-10-CM | POA: Diagnosis not present

## 2023-12-27 DIAGNOSIS — M51369 Other intervertebral disc degeneration, lumbar region without mention of lumbar back pain or lower extremity pain: Secondary | ICD-10-CM | POA: Diagnosis not present

## 2023-12-27 DIAGNOSIS — Z79899 Other long term (current) drug therapy: Secondary | ICD-10-CM | POA: Diagnosis not present

## 2024-01-05 ENCOUNTER — Telehealth: Payer: Self-pay

## 2024-01-05 NOTE — Telephone Encounter (Signed)
  Phone call to patient to follow up from her GAE on 12/26/23. She reports that her knee pain is better but she still present. She also reports a "huge" bruise midway down her thigh that is about the size of her hand and hurts. She denies any signs of infection, redness at the right femoral site, drainage at the site, fever, nausea, or vomiting. Dr. Jinx Mourning will call her within the month to check on her status post-procedure. She was advised to call back if anything were to change or any concerns arise and we will arrange an in person appointment. Patient verbalized understanding.

## 2024-01-12 ENCOUNTER — Ambulatory Visit
Admission: RE | Admit: 2024-01-12 | Discharge: 2024-01-12 | Disposition: A | Discharge status: Home / Self Care | Source: Ambulatory Visit | Attending: Interventional Radiology | Admitting: Interventional Radiology

## 2024-01-12 ENCOUNTER — Telehealth: Payer: Self-pay | Admitting: Interventional Radiology

## 2024-01-12 ENCOUNTER — Other Ambulatory Visit: Payer: Self-pay | Admitting: Interventional Radiology

## 2024-01-12 DIAGNOSIS — M25561 Pain in right knee: Secondary | ICD-10-CM

## 2024-01-12 DIAGNOSIS — M79604 Pain in right leg: Secondary | ICD-10-CM | POA: Diagnosis not present

## 2024-01-12 HISTORY — PX: IR RADIOLOGIST EVAL & MGMT: IMG5224

## 2024-01-12 NOTE — Progress Notes (Signed)
 Chief Complaint: Right lower extremity pain after geniculate artery embolization   Referring Physician(s): Thekkekandam,Thomas J   History of Present Illness: Sophia Smith is a 63 y.o. female with a medical history significant for HTN, COPD, tobacco use, depression, fibromyalgia, vertebral artery dissection, mitral and tricuspid regurgitation and chronic pain followed by a pain clinic. She also has a history of lumbar spondylosis with chronic low back pain and primary osteoarthritis of the right knee. She has had the entire Orthovisc series, multiple steroid injections, activity modification and pain medicine all to no avail. She is followed by Dr. Sandy Crumb and he thinks some of her pain may be related to her chronic pain syndrome but he thinks it is reasonable to attempt geniculate artery embolization. She has been referred to Interventional Radiology for further discussion.    Her pain is most severe when climbing stairs and standing.  She has extreme morning stiffness in the right knee.  The pain makes multiple activities of daily living difficult, most extreme with going shopping in stores, putting on socks, getting out of the bed, getting on and off the toilet, and heavy housework.  She takes oxycodone  for pain, managed by a pain clinic.  This does not help her knee much.  She had her left knee replaced approximately 15 years ago.  She wants to do whatever she can to avoid a right TKA with fear of the recovery process.   Womac Pain Score = 71/96 VAS Pain Score = 8/10   She is a retired Designer, jewellery, with experience in Interventional Radiology.  She presents today via telephone visit complaining of right lower extremity pain.  This has extended from her thigh to her calf.  She states that it is worse after she has been up walking around. She endorses some bruising in her right thigh.  No swelling or redness.  No shortness of breath or chest pain.  The pain has been severe, to the  point she has almost gone to the emergency department.   Past Medical History:  Diagnosis Date   Anemia    Back disorder    Cellulitis of left lower leg 08/04/2017   Complication of anesthesia    was having a back surgery, prone position, patient vomitted, had to be intubated- no problems since that time.   Fibromyalgia    GERD (gastroesophageal reflux disease)    Heart murmur    History of hiatal hernia    Hypertension    Iron  deficiency anemia    recieved IV iron .   Knee gives out    Other disorders of continuity of bone, right pelvic region and thigh    Stroke (HCC)    2 TIA's    Past Surgical History:  Procedure Laterality Date   ARTHRODESIS METATARSALPHALANGEAL JOINT (MTPJ) Right 08/03/2021   Procedure: FUSION RIGHT GREAT TOE METATARSALPHALANGEAL JOINT (MTPJ), WEIL OSTEOTOMY 2ND METATARSAL, PROXIMAL INERPHALANGEAL RESECTION 2ND AND 3RD TOES;  Surgeon: Timothy Ford, MD;  Location: [facility] OR;  Service: Orthopedics;  Laterality: Right;   CHOLECYSTECTOMY     [place]  Right Carotid Right 2012   Dissecton with pseudoanevrysm   GASTROCNEMIUS RECESSION Right 08/03/2021   Procedure: RIGHT GASTROCNEMIUS RECESSION;  Surgeon: Timothy Ford, MD;  Location: [facility] OR;  Service: Orthopedics;  Laterality: Right;   IR EMBO ARTERIAL NOT HEMORR HEMANG INC GUIDE ROADMAPPING  12/26/2023   IR RADIOLOGIST EVAL & MGMT  09/29/2023   LEG SURGERY Bilateral    numerous leg surgeries post accident  age 67   SIGMOID RESECTION / RECTOPEXY     Traumatic amputation Right    5th toe, ran over by a lawn mower.  was a child.    Allergies: Nsaids  Medications: Prior to Admission medications   Medication Sig Start Date End Date Taking? Authorizing Provider  albuterol  (VENTOLIN  HFA) 108 (90 Base) MCG/ACT inhaler Inhale 2 puffs into the lungs every 4 (four) hours as needed for wheezing or shortness of breath. 06/11/22   Breeback, Jade L, PA-C  amitriptyline  (ELAVIL ) 150 MG tablet TAKE ONE TABLET BY MOUTH AT  BEDTIME Patient taking differently: Take 150 mg by mouth at bedtime. 04/14/20   Breeback, Jade L, PA-C  amLODipine  (NORVASC ) 10 MG tablet Take 1 tablet (10 mg total) by mouth daily. 04/01/23   Breeback, Jade L, PA-C  atorvastatin  (LIPITOR) 40 MG tablet TAKE 1 TABLET BY MOUTH DAILY 12/01/23   Breeback, Jade L, PA-C  cyclobenzaprine  (FLEXERIL ) 10 MG tablet Take 1 tablet (10 mg total) by mouth at bedtime. 06/25/22   Breeback, Jade L, PA-C  DULoxetine  (CYMBALTA ) 60 MG capsule Take 1 capsule (60 mg total) by mouth 2 (two) times daily. 04/01/23   Breeback, Jade L, PA-C  furosemide  (LASIX ) 40 MG tablet Take 1 tablet (40 mg total) by mouth daily. As needed for lower extremity edema. Patient taking differently: Take 40 mg by mouth daily as needed for fluid or edema. 08/31/18   Breeback, Jade L, PA-C  gabapentin (NEURONTIN) 300 MG capsule Take 900 mg by mouth 3 (three) times daily. 07/10/20   [provider]  hydrochlorothiazide  (HYDRODIURIL ) 12.5 MG tablet Take 1 tablet (12.5 mg total) by mouth daily. 04/01/23   Breeback, Jade L, PA-C  NARCAN 4 MG/0.1ML LIQD nasal spray kit Place 1 spray into the nose once. 07/22/20   [provider]  oxyCODONE -acetaminophen  (PERCOCET) 10-325 MG tablet Take by mouth. 11/07/21   [provider]  pantoprazole  (PROTONIX ) 40 MG tablet TAKE ONE TABLET BY MOUTH DAILY Patient taking differently: Take 40 mg by mouth daily. 09/14/19   Araceli Knight, PA-C     Family History  Problem Relation Age of Onset   Breast cancer Mother    Heart attack Father    Cancer Father    Breast cancer Sister    Breast cancer Maternal Grandmother     Social History   Socioeconomic History   Marital status: Divorced    Spouse name: Not on file   Number of children: 1   Years of education: 14   Highest education level: Associate degree: academic program  Occupational History   Occupation: Engineer, civil (consulting)    Comment: Disabled  Tobacco Use   Smoking status: Every Day    Current  packs/day: 0.50    Average packs/day: 0.5 packs/day for 40.0 years (20.0 ttl pk-yrs)    Types: Cigarettes   Smokeless tobacco: Never  Vaping Use   Vaping status: Never Used  Substance and Sexual Activity   Alcohol use: No   Drug use: No   Sexual activity: Not Currently  Other Topics Concern   Not on file  Social History Narrative   Lives with her daughter. She enjoys reading.   Social Drivers of Corporate investment banker Strain: Low Risk  (02/11/2023)   Overall Financial Resource Strain (CARDIA)    Difficulty of Paying Living Expenses: Not hard at all  Recent Concern: Financial Resource Strain - Medium Risk (11/29/2022)   Received from [facility], [facility]   Overall Financial  Resource Strain (CARDIA)    Difficulty of Paying Living Expenses: Somewhat hard  Food Insecurity: No Food Insecurity (02/11/2023)   Hunger Vital Sign    Worried About Running Out of Food in the Last Year: Never true    Ran Out of Food in the Last Year: Never true  Recent Concern: Food Insecurity - Food Insecurity Present (11/29/2022)   Received from [facility], [facility]   Hunger Vital Sign    Worried About Running Out of Food in the Last Year: Sometimes true    Ran Out of Food in the Last Year: Sometimes true  Transportation Needs: No Transportation Needs (02/11/2023)   PRAPARE - Administrator, Civil Service (Medical): No    Lack of Transportation (Non-Medical): No  Physical Activity: Inactive (02/11/2023)   Exercise Vital Sign    Days of Exercise per Week: 0 days    Minutes of Exercise per Session: 0 min  Stress: No Stress Concern Present (02/11/2023)   [employer] of Occupational Health - Occupational Stress Questionnaire    Feeling of Stress : Not at all  Social Connections: Moderately Integrated (02/11/2023)   Social Connection and Isolation Panel [NHANES]    Frequency of Communication with Friends and Family: More than three times a week    Frequency of Social Gatherings  with Friends and Family: More than three times a week    Attends Religious Services: More than 4 times per year    Active Member of [employer] or Organizations: Yes    Attends Engineer, structural: More than 4 times per year    Marital Status: Divorced  Recent Concern: Social Connections - Moderately Isolated (02/11/2023)   Social Connection and Isolation Panel [NHANES]    Frequency of Communication with Friends and Family: More than three times a week    Frequency of Social Gatherings with Friends and Family: More than three times a week    Attends Religious Services: More than 4 times per year    Active Member of [employer] or Organizations: No    Attends Banker Meetings: Never    Marital Status: Divorced     Vital Signs: LMP 12/11/2012   No physical examination was performed in lieu of virtual telephone clinic visit.   Imaging: No results found.  Labs:  CBC: No results for input(s): "WBC", "HGB", "HCT", "PLT" in the last 8760 hours.  COAGS: No results for input(s): "INR", "APTT" in the last 8760 hours.  BMP: No results for input(s): "NA", "K", "CL", "CO2", "GLUCOSE", "BUN", "CALCIUM ", "CREATININE", "GFRNONAA", "GFRAA" in the last 8760 hours.  Invalid input(s): "CMP"  LIVER FUNCTION TESTS: No results for input(s): "BILITOT", "AST", "ALT", "ALKPHOS", "PROT", "ALBUMIN" in the last 8760 hours.  Assessment and Plan: 63  year old female with right lower extremity pain after geniculate artery embolization on 12/26/23.  I am concerned for intramuscular hematoma related to the access site versus deep vein thrombosis.  We discussed this and plan for a right lower extremity venous duplex tomorrow to assess for DVT.  I will follow up once imaging obtained.  Electronically Signed: Federico Hopkins 01/12/2024, 4:45 PM   I spent a total of 15 Minutes in virtual telephone clinical consultation, greater than 50% of which was counseling/coordinating care for right leg  pain.

## 2024-01-12 NOTE — Telephone Encounter (Signed)
 Pc from pt c/o persistent knee pain following GAE on 12/26/23. Pain has ranged from 6-9 out of 10. Pt has taken oxycodone  she had left over from a previous prescription, which has not helped her pain. Dr. Jinx Mourning aware.

## 2024-01-13 ENCOUNTER — Other Ambulatory Visit: Payer: Self-pay | Admitting: Interventional Radiology

## 2024-01-13 DIAGNOSIS — M25561 Pain in right knee: Secondary | ICD-10-CM

## 2024-01-14 ENCOUNTER — Other Ambulatory Visit

## 2024-01-15 ENCOUNTER — Ambulatory Visit
Admission: RE | Admit: 2024-01-15 | Discharge: 2024-01-15 | Disposition: A | Discharge status: Home / Self Care | Source: Ambulatory Visit | Attending: Interventional Radiology | Admitting: Interventional Radiology

## 2024-01-15 DIAGNOSIS — M79604 Pain in right leg: Secondary | ICD-10-CM | POA: Diagnosis not present

## 2024-01-15 DIAGNOSIS — M25561 Pain in right knee: Secondary | ICD-10-CM

## 2024-01-21 ENCOUNTER — Other Ambulatory Visit: Payer: Self-pay | Admitting: Interventional Radiology

## 2024-01-21 DIAGNOSIS — M1711 Unilateral primary osteoarthritis, right knee: Secondary | ICD-10-CM

## 2024-01-24 DIAGNOSIS — M5416 Radiculopathy, lumbar region: Secondary | ICD-10-CM | POA: Diagnosis not present

## 2024-01-24 DIAGNOSIS — Z79899 Other long term (current) drug therapy: Secondary | ICD-10-CM | POA: Diagnosis not present

## 2024-01-24 DIAGNOSIS — Z79891 Long term (current) use of opiate analgesic: Secondary | ICD-10-CM | POA: Diagnosis not present

## 2024-01-24 DIAGNOSIS — M51369 Other intervertebral disc degeneration, lumbar region without mention of lumbar back pain or lower extremity pain: Secondary | ICD-10-CM | POA: Diagnosis not present

## 2024-01-24 DIAGNOSIS — G8929 Other chronic pain: Secondary | ICD-10-CM | POA: Diagnosis not present

## 2024-01-27 NOTE — Progress Notes (Signed)
 Referring Physician(s): Curtis [name] PARAS   Chief Complaint: The patient is seen in follow up today s/p right geniculate artery embolization 12/26/23  History of present illness: HPI from last clinic visit 01/12/24 Sophia Smith is a 63 y.o. female with a medical history significant for HTN, COPD, tobacco use, depression, fibromyalgia, vertebral artery dissection, mitral and tricuspid regurgitation and chronic pain followed by a pain clinic. She also has a history of lumbar spondylosis with chronic low back pain and primary osteoarthritis of the right knee. She has had the entire Orthovisc series, multiple steroid injections, activity modification and pain medicine all to no avail. She is followed by Dr. Curtis and he thinks some of her pain may be related to her chronic pain syndrome but he thinks it is reasonable to attempt geniculate artery embolization. She has been referred to Interventional Radiology for further discussion.    Her pain is most severe when climbing stairs and standing.  She has extreme morning stiffness in the right knee.  The pain makes multiple activities of daily living difficult, most extreme with going shopping in stores, putting on socks, getting out of the bed, getting on and off the toilet, and heavy housework.  She takes oxycodone  for pain, managed by a pain clinic.  This does not help her knee much.  She had her left knee replaced approximately 15 years ago.  She wants to do whatever she can to avoid a right TKA with fear of the recovery process.   Womac Pain Score = 71/96 VAS Pain Score = 8/10   She is a retired Designer, jewellery, with experience in Interventional Radiology.  We discussed the risks, benefits, alternatives and procedural expectations with right geniculate artery embolization. She was interested in proceeding and underwent a technically successful right GAE 12/26/23. She tolerated the procedure well and was discharged home the same day  however she called the clinic 01/05/24 with concerns regarding a large, painful bruise on her thigh. She reported some improvement in her knee pain and denied any other complaints/symptoms. She was given reassurance and we made plans to follow up in one month with instructions to call back if she had further concerns. She called the clinic again on 01/12/24 with complaints of right lower extremity pain extending from her thigh to her calf. She reported worsening pain with ambulation. I was concerned for a possible intramuscular hematoma related to the access site versus a deep vein thrombosis. A right lower extremity duplex was ordered for further work up and this was completed 01/15/24.   She presents to the clinic today for follow up and to discuss these results.   Past Medical History:  Diagnosis Date   Anemia    Back disorder    Cellulitis of left lower leg 08/04/2017   Complication of anesthesia    was having a back surgery, prone position, patient vomitted, had to be intubated- no problems since that time.   Fibromyalgia    GERD (gastroesophageal reflux disease)    Heart murmur    History of hiatal hernia    Hypertension    Iron  deficiency anemia    recieved IV iron .   Knee gives out    Other disorders of continuity of bone, right pelvic region and thigh    Stroke (HCC)    2 TIA's    Past Surgical History:  Procedure Laterality Date   ARTHRODESIS METATARSALPHALANGEAL JOINT (MTPJ) Right 08/03/2021   Procedure: FUSION RIGHT GREAT TOE METATARSALPHALANGEAL JOINT (MTPJ), WEIL OSTEOTOMY  2ND METATARSAL, PROXIMAL INERPHALANGEAL RESECTION 2ND AND 3RD TOES;  Surgeon: Harden Jerona GAILS, MD;  Location: [facility] OR;  Service: Orthopedics;  Laterality: Right;   CHOLECYSTECTOMY     [place]  Right Carotid Right 2012   Dissecton with pseudoanevrysm   GASTROCNEMIUS RECESSION Right 08/03/2021   Procedure: RIGHT GASTROCNEMIUS RECESSION;  Surgeon: Harden Jerona GAILS, MD;  Location[facility] OR;  Service: Orthopedics;   Laterality: Right;   IR EMBO ARTERIAL NOT HEMORR HEMANG INC GUIDE ROADMAPPING  12/26/2023   IR RADIOLOGIST EVAL & MGMT  09/29/2023   IR RADIOLOGIST EVAL & MGMT  01/12/2024   LEG SURGERY Bilateral    numerous leg surgeries post accident age 14   SIGMOID RESECTION / RECTOPEXY     Traumatic amputation Right    5th toe, ran over by a lawn mower.  was a child.    Allergies: Nsaids  Medications: Prior to Admission medications   Medication Sig Start Date End Date Taking? Authorizing Provider  albuterol  (VENTOLIN  HFA) 108 (90 Base) MCG/ACT inhaler Inhale 2 puffs into the lungs every 4 (four) hours as needed for wheezing or shortness of breath. 06/11/22   Breeback, Jade L, PA-C  amitriptyline  (ELAVIL ) 150 MG tablet TAKE ONE TABLET BY MOUTH AT BEDTIME Patient taking differently: Take 150 mg by mouth at bedtime. 04/14/20   Breeback, Jade L, PA-C  amLODipine  (NORVASC ) 10 MG tablet Take 1 tablet (10 mg total) by mouth daily. 04/01/23   Breeback, Jade L, PA-C  atorvastatin  (LIPITOR) 40 MG tablet TAKE 1 TABLET BY MOUTH DAILY 12/01/23   Breeback, Jade L, PA-C  cyclobenzaprine  (FLEXERIL ) 10 MG tablet Take 1 tablet (10 mg total) by mouth at bedtime. 06/25/22   Breeback, Jade L, PA-C  DULoxetine  (CYMBALTA ) 60 MG capsule Take 1 capsule (60 mg total) by mouth 2 (two) times daily. 04/01/23   Breeback, Jade L, PA-C  furosemide  (LASIX ) 40 MG tablet Take 1 tablet (40 mg total) by mouth daily. As needed for lower extremity edema. Patient taking differently: Take 40 mg by mouth daily as needed for fluid or edema. 08/31/18   Breeback, Jade L, PA-C  gabapentin (NEURONTIN) 300 MG capsule Take 900 mg by mouth 3 (three) times daily. 07/10/20   [provider]  hydrochlorothiazide  (HYDRODIURIL ) 12.5 MG tablet Take 1 tablet (12.5 mg total) by mouth daily. 04/01/23   Breeback, Jade L, PA-C  NARCAN 4 MG/0.1ML LIQD nasal spray kit Place 1 spray into the nose once. 07/22/20   [provider]  oxyCODONE -acetaminophen   (PERCOCET) 10-325 MG tablet Take by mouth. 11/07/21   [provider]  pantoprazole  (PROTONIX ) 40 MG tablet TAKE ONE TABLET BY MOUTH DAILY Patient taking differently: Take 40 mg by mouth daily. 09/14/19   Antoniette Vermell CROME, PA-C     Family History  Problem Relation Age of Onset   Breast cancer Mother    Heart attack Father    Cancer Father    Breast cancer Sister    Breast cancer Maternal Grandmother     Social History   Socioeconomic History   Marital status: Divorced    Spouse name: Not on file   Number of children: 1   Years of education: 14   Highest education level: Associate degree: academic program  Occupational History   Occupation: Engineer, civil (consulting)    Comment: Disabled  Tobacco Use   Smoking status: Every Day    Current packs/day: 0.50    Average packs/day: 0.5 packs/day for 40.0 years (20.0 ttl pk-yrs)  Types: Cigarettes   Smokeless tobacco: Never  Vaping Use   Vaping status: Never Used  Substance and Sexual Activity   Alcohol use: No   Drug use: No   Sexual activity: Not Currently  Other Topics Concern   Not on file  Social History Narrative   Lives with her daughter. She enjoys reading.   Social Drivers of Corporate investment banker Strain: Low Risk  (02/11/2023)   Overall Financial Resource Strain (CARDIA)    Difficulty of Paying Living Expenses: Not hard at all  Recent Concern: Financial Resource Strain - Medium Risk (11/29/2022)   Received from [facility]   Overall Financial Resource Strain (CARDIA)    Difficulty of Paying Living Expenses: Somewhat hard  Food Insecurity: No Food Insecurity (02/11/2023)   Hunger Vital Sign    Worried About Running Out of Food in the Last Year: Never true    Ran Out of Food in the Last Year: Never true  Recent Concern: Food Insecurity - Food Insecurity Present (11/29/2022)   Received from [facility]   Hunger Vital Sign    Within the past 12 months, you worried that your food would run out before you got the money to  buy more.: Sometimes true    Within the past 12 months, the food you bought just didn't last and you didn't have money to get more.: Sometimes true  Transportation Needs: No Transportation Needs (02/11/2023)   PRAPARE - Administrator, Civil Service (Medical): No    Lack of Transportation (Non-Medical): No  Physical Activity: Inactive (02/11/2023)   Exercise Vital Sign    Days of Exercise per Week: 0 days    Minutes of Exercise per Session: 0 min  Stress: No Stress Concern Present (02/11/2023)   [employer] of Occupational Health - Occupational Stress Questionnaire    Feeling of Stress : Not at all  Social Connections: Moderately Integrated (02/11/2023)   Social Connection and Isolation Panel    Frequency of Communication with Friends and Family: More than three times a week    Frequency of Social Gatherings with Friends and Family: More than three times a week    Attends Religious Services: More than 4 times per year    Active Member of [employer] or Organizations: Yes    Attends Engineer, structural: More than 4 times per year    Marital Status: Divorced  Recent Concern: Social Connections - Moderately Isolated (02/11/2023)   Social Connection and Isolation Panel    Frequency of Communication with Friends and Family: More than three times a week    Frequency of Social Gatherings with Friends and Family: More than three times a week    Attends Religious Services: More than 4 times per year    Active Member of [employer] or Organizations: No    Attends Banker Meetings: Never    Marital Status: Divorced     Vital Signs: LMP 12/11/2012   Physical Exam  Imaging: Right knee radiograph 03/31/23  Kellgren and Lawrence Grade II  Labs:  CBC: No results for input(s): WBC, HGB, HCT, PLT in the last 8760 hours.  COAGS: No results for input(s): INR, APTT in the last 8760 hours.  BMP: No results for input(s): NA, K, CL, CO2, GLUCOSE,  BUN, CALCIUM , CREATININE, GFRNONAA, GFRAA in the last 8760 hours.  Invalid input(s): CMP  LIVER FUNCTION TESTS: No results for input(s): BILITOT, AST, ALT, ALKPHOS, PROT, ALBUMIN in the last 8760 hours.  Assessment  and Plan:  63 year old female with a history of severe right knee pain (WOMAC 71/96) secondary to moderate osteoarthritis (K&G 2) refractory to conservative treatments. She underwent a technically successful right geniculate artery embolization 12/26/23 but developed a painful bruise on her thigh post-procedure. She experienced significant pain with ambulation and a lower extremity duplex was ordered to assess for an intramuscular hematoma versus a DVT. Fortunately this study was negative for any acute findings.   Ester Sides, MD Pager: [phone]    I spent a total of 25 Minutes in face to face in clinical consultation, greater than 50% of which was counseling/coordinating care for right knee pain.

## 2024-01-28 ENCOUNTER — Ambulatory Visit
Admission: RE | Admit: 2024-01-28 | Discharge: 2024-01-28 | Disposition: A | Discharge status: Home / Self Care | Source: Ambulatory Visit | Attending: Interventional Radiology | Admitting: Interventional Radiology

## 2024-01-28 DIAGNOSIS — M1711 Unilateral primary osteoarthritis, right knee: Secondary | ICD-10-CM | POA: Diagnosis not present

## 2024-01-28 DIAGNOSIS — M25561 Pain in right knee: Secondary | ICD-10-CM | POA: Diagnosis not present

## 2024-01-28 HISTORY — PX: IR RADIOLOGIST EVAL & MGMT: IMG5224

## 2024-02-02 ENCOUNTER — Ambulatory Visit (INDEPENDENT_AMBULATORY_CARE_PROVIDER_SITE_OTHER): Admitting: Sports Medicine

## 2024-02-02 DIAGNOSIS — M1711 Unilateral primary osteoarthritis, right knee: Secondary | ICD-10-CM

## 2024-02-02 DIAGNOSIS — M47816 Spondylosis without myelopathy or radiculopathy, lumbar region: Secondary | ICD-10-CM | POA: Diagnosis not present

## 2024-02-02 NOTE — Assessment & Plan Note (Signed)
 Kylee returns, she has multifactorial chronic low back pain, she had some epidurals in the past without significant efficacy, she is also working with a pain management provider now and does get some oxycodone . On gabapentin and unable to take NSAIDs. My initial suspicion was the primary pain generator or her very arthritic bilateral L3-L5 facet joints, however she did endorse some right sided leg pain with an L5 distribution so we proceeded with a right L5-S1 transforaminal epidural, she had initial good relief but her second epidural did not provide any relief. For this reason we will proceed with bilateral L3-L5 facet MBB and then RFA if successful. She does understand that at this point we are getting somewhat outside of my specialty and more towards the pain management realm.  Update: Devere never got a call to do her bilateral L3-L5 facet MBB and then RFA.  I will place this referral again, we will use Dr. Rosella.

## 2024-02-02 NOTE — Assessment & Plan Note (Signed)
 Sophia Smith returns, she has had an entire series of Orthovisc, multiple steroid injections, activity modification, medication, as well as geniculate artery embolization and she continues to have knee pain. She is status post left knee arthroplasty, we will set her up with Dr. Ronnald to discuss right knee arthroplasty.

## 2024-02-02 NOTE — Progress Notes (Signed)
    Procedures performed today:    None.  Independent interpretation of notes and tests performed by another provider:   None.  Brief History, Exam, Impression, and Recommendations:    Lumbar spondylosis Sophia Smith returns, she has multifactorial chronic low back pain, she had some epidurals in the past without significant efficacy, she is also working with a pain management provider now and does get some oxycodone . On gabapentin and unable to take NSAIDs. My initial suspicion was the primary pain generator or her very arthritic bilateral L3-L5 facet joints, however she did endorse some right sided leg pain with an L5 distribution so we proceeded with a right L5-S1 transforaminal epidural, she had initial good relief but her second epidural did not provide any relief. For this reason we will proceed with bilateral L3-L5 facet MBB and then RFA if successful. She does understand that at this point we are getting somewhat outside of my specialty and more towards the pain management realm.  Update: Sophia Smith never got a call to do her bilateral L3-L5 facet MBB and then RFA.  I will place this referral again, we will use Dr. Rosella.  Primary osteoarthritis of right knee Sophia Smith returns, she has had an entire series of Orthovisc, multiple steroid injections, activity modification, medication, as well as geniculate artery embolization and she continues to have knee pain. She is status post left knee arthroplasty, we will set her up with Dr. Ronnald to discuss right knee arthroplasty.    ____________________________________________ [name] PARAS. Curtis, M.D., ABFM., CAQSM., AME. Primary Care and Sports Medicine [facility]  Adjunct Professor of [facility] Medicine  [employer]  Restaurant manager, fast food

## 2024-02-11 ENCOUNTER — Encounter: Payer: Self-pay | Admitting: Family

## 2024-02-11 ENCOUNTER — Telehealth: Payer: Self-pay

## 2024-02-11 NOTE — Telephone Encounter (Signed)
-----   Message from [place] sent at 02/09/2024  1:07 PM EDT ----- Call for patient to schedule a virtual or in person appt to discuss CT lung cancer screening and findings. ----- Message ----- From: Dionisio Aquas, RN Sent: 11/13/2023   7:36 AM EDT To: Jade L Breeback, PA-C

## 2024-02-11 NOTE — Telephone Encounter (Signed)
Called patient LVM to call office to schedule appt, thanks.

## 2024-02-17 ENCOUNTER — Encounter

## 2024-02-17 ENCOUNTER — Ambulatory Visit: Payer: Self-pay | Admitting: Physician Assistant

## 2024-02-18 ENCOUNTER — Ambulatory Visit: Payer: Self-pay | Admitting: Physician Assistant

## 2024-02-18 ENCOUNTER — Encounter: Payer: Self-pay | Admitting: Physician Assistant

## 2024-02-18 ENCOUNTER — Ambulatory Visit (INDEPENDENT_AMBULATORY_CARE_PROVIDER_SITE_OTHER): Payer: Self-pay | Admitting: Physician Assistant

## 2024-02-18 VITALS — BP 155/101 | HR 106 | Temp 98.3°F | Ht 65.0 in | Wt 173.0 lb

## 2024-02-18 DIAGNOSIS — J329 Chronic sinusitis, unspecified: Secondary | ICD-10-CM

## 2024-02-18 DIAGNOSIS — R6889 Other general symptoms and signs: Secondary | ICD-10-CM

## 2024-02-18 DIAGNOSIS — J40 Bronchitis, not specified as acute or chronic: Secondary | ICD-10-CM

## 2024-02-18 LAB — POC COVID19 BINAXNOW: SARS Coronavirus 2 Ag: NEGATIVE

## 2024-02-18 LAB — POCT RAPID STREP A (OFFICE): Rapid Strep A Screen: NEGATIVE

## 2024-02-18 LAB — POCT INFLUENZA A/B
Influenza A, POC: NEGATIVE
Influenza B, POC: NEGATIVE

## 2024-02-18 MED ORDER — METHYLPREDNISOLONE 4 MG PO TBPK
ORAL_TABLET | ORAL | 0 refills | Status: DC
Start: 2024-02-18 — End: 2024-04-13

## 2024-02-18 MED ORDER — PROMETHAZINE-DM 6.25-15 MG/5ML PO SYRP: 5.0000 mL | ORAL_SOLUTION | Freq: Four times a day (QID) | ORAL | 0 refills | Status: DC | PRN

## 2024-02-18 NOTE — Progress Notes (Signed)
   Acute Office Visit  Subjective:     Patient ID: Sophia Smith, female    DOB: 03-Jun-1961, 63 y.o.   MRN: [id number]  Chief Complaint  Patient presents with   Sinus Problem    Sinus congestion with cough pt satets she been sick since Saturday, pt present with cough fatigue,headache and decreased appetite    Patient is in today for sinus congestion. She thinks she has a sinus infection due to presence of cough, fatigue, headache, and decreased appetite since Saturday. Patient has been blowing clear to green mucus out of her nose. She states she feels congested and has pressure under her eyes and nasal area. She has tried taking Sudafed with no relief. She is still smoking. No fever, chills, or body aches present.   Review of Systems  Constitutional:  Negative for chills and fever.       Decreased appetite  HENT:  Positive for congestion and sinus pain.   Respiratory:  Positive for cough and sputum production.         Objective:    BP (!) 155/101   Pulse (!) 106   Temp 98.3 F (36.8 C) (Oral)   Ht 5' 5 (1.651 m)   Wt 78.5 kg   LMP 12/11/2012   SpO2 99%   BMI 28.79 kg/m     Physical Exam Constitutional:      Appearance: Normal appearance.  HENT:     Head: Normocephalic.     Ears:     Comments: Slightly erythematous around TM, dull from 3o clock to 6 oclock, light reflex present, no bulging TM    Nose: Congestion and rhinorrhea present.     Mouth/Throat:     Mouth: Mucous membranes are moist.  Cardiovascular:     Rate and Rhythm: Tachycardia present.     Pulses: Normal pulses.  Pulmonary:     Effort: Pulmonary effort is normal.     Breath sounds: Normal breath sounds.  Musculoskeletal:     Cervical back: Normal range of motion.  Skin:    General: Skin is warm.  Neurological:     General: No focal deficit present.     Mental Status: She is alert and oriented to person, place, and time.     No results found for any visits on 02/18/24.      Assessment  & Plan:   Problem List Items Addressed This Visit   None Visit Diagnoses       Flu-like symptoms    -  Primary   Relevant Orders   POC COVID-19 BinaxNow   POCT Influenza A/B   POCT rapid strep A       No orders of the defined types were placed in this encounter.  Flu-like symptoms  Cough/ Sinus Pressure/ Congestion Current smoker COPD/ Central lobar emphysema   - Flu A/B, COVID, Strep tests in office: negative  - Medrol  dose pack sent into pharmacy  - Recommended using Delsym or Mucinex to help with clearing mucus  - Educated patient to stay hydrated and rest  No follow-ups on file.  Tinnie FORBES Patient, Student-PA

## 2024-02-18 NOTE — Progress Notes (Signed)
 Call patient: let her know that strep, flu, covid negative. I will send over prednisone . Take mucinex and delsym OTC with prednisone .

## 2024-02-19 DIAGNOSIS — M47816 Spondylosis without myelopathy or radiculopathy, lumbar region: Secondary | ICD-10-CM | POA: Diagnosis not present

## 2024-02-20 ENCOUNTER — Encounter: Payer: Self-pay | Admitting: Physician Assistant

## 2024-02-21 DIAGNOSIS — Z79899 Other long term (current) drug therapy: Secondary | ICD-10-CM | POA: Diagnosis not present

## 2024-02-21 DIAGNOSIS — M5416 Radiculopathy, lumbar region: Secondary | ICD-10-CM | POA: Diagnosis not present

## 2024-02-21 DIAGNOSIS — M51369 Other intervertebral disc degeneration, lumbar region without mention of lumbar back pain or lower extremity pain: Secondary | ICD-10-CM | POA: Diagnosis not present

## 2024-02-21 DIAGNOSIS — Z79891 Long term (current) use of opiate analgesic: Secondary | ICD-10-CM | POA: Diagnosis not present

## 2024-02-21 DIAGNOSIS — G8929 Other chronic pain: Secondary | ICD-10-CM | POA: Diagnosis not present

## 2024-02-23 DIAGNOSIS — M1711 Unilateral primary osteoarthritis, right knee: Secondary | ICD-10-CM | POA: Diagnosis not present

## 2024-02-23 DIAGNOSIS — M25561 Pain in right knee: Secondary | ICD-10-CM | POA: Diagnosis not present

## 2024-02-26 DIAGNOSIS — M25561 Pain in right knee: Secondary | ICD-10-CM | POA: Diagnosis not present

## 2024-03-04 DIAGNOSIS — M1711 Unilateral primary osteoarthritis, right knee: Secondary | ICD-10-CM | POA: Diagnosis not present

## 2024-03-16 DIAGNOSIS — M47816 Spondylosis without myelopathy or radiculopathy, lumbar region: Secondary | ICD-10-CM | POA: Diagnosis not present

## 2024-03-20 DIAGNOSIS — M51369 Other intervertebral disc degeneration, lumbar region without mention of lumbar back pain or lower extremity pain: Secondary | ICD-10-CM | POA: Diagnosis not present

## 2024-03-20 DIAGNOSIS — M5416 Radiculopathy, lumbar region: Secondary | ICD-10-CM | POA: Diagnosis not present

## 2024-03-20 DIAGNOSIS — F1120 Opioid dependence, uncomplicated: Secondary | ICD-10-CM | POA: Diagnosis not present

## 2024-03-20 DIAGNOSIS — Z79899 Other long term (current) drug therapy: Secondary | ICD-10-CM | POA: Diagnosis not present

## 2024-04-06 ENCOUNTER — Encounter: Payer: Self-pay | Admitting: Sports Medicine

## 2024-04-12 ENCOUNTER — Encounter: Payer: Self-pay | Admitting: Family

## 2024-04-13 ENCOUNTER — Ambulatory Visit (INDEPENDENT_AMBULATORY_CARE_PROVIDER_SITE_OTHER): Payer: Self-pay | Admitting: Physician Assistant

## 2024-04-13 ENCOUNTER — Encounter: Payer: Self-pay | Admitting: Physician Assistant

## 2024-04-13 ENCOUNTER — Telehealth: Payer: Self-pay

## 2024-04-13 VITALS — BP 140/90 | HR 99 | Temp 98.1°F | Resp 18 | Wt 156.0 lb

## 2024-04-13 DIAGNOSIS — R051 Acute cough: Secondary | ICD-10-CM | POA: Diagnosis not present

## 2024-04-13 DIAGNOSIS — J441 Chronic obstructive pulmonary disease with (acute) exacerbation: Secondary | ICD-10-CM

## 2024-04-13 DIAGNOSIS — R062 Wheezing: Secondary | ICD-10-CM

## 2024-04-13 DIAGNOSIS — R5381 Other malaise: Secondary | ICD-10-CM

## 2024-04-13 LAB — POC SOFIA 2 FLU + SARS ANTIGEN FIA
Influenza A, POC: NEGATIVE
Influenza B, POC: NEGATIVE
SARS Coronavirus 2 Ag: NEGATIVE

## 2024-04-13 MED ORDER — IPRATROPIUM-ALBUTEROL 0.5-2.5 (3) MG/3ML IN SOLN: 3.0000 mL | RESPIRATORY_TRACT | 0 refills | Status: DC | PRN

## 2024-04-13 MED ORDER — DOXYCYCLINE HYCLATE 100 MG PO TABS: 100.0000 mg | ORAL_TABLET | Freq: Two times a day (BID) | ORAL | 0 refills | Status: AC

## 2024-04-13 MED ORDER — IPRATROPIUM-ALBUTEROL 0.5-2.5 (3) MG/3ML IN SOLN: 3.0000 mL | Freq: Four times a day (QID) | RESPIRATORY_TRACT | 0 refills | Status: AC | PRN

## 2024-04-13 MED ORDER — PROMETHAZINE-DM 6.25-15 MG/5ML PO SYRP: 5.0000 mL | ORAL_SOLUTION | Freq: Four times a day (QID) | ORAL | 0 refills | Status: AC | PRN

## 2024-04-13 MED ORDER — METHYLPREDNISOLONE 4 MG PO TBPK: ORAL_TABLET | ORAL | 0 refills | Status: AC

## 2024-04-13 NOTE — Patient Instructions (Signed)
 Use nebulizer every 4-6 hours  Start doxycycline  and medrol  dose pack Cough syrup as needed  Chronic Obstructive Pulmonary Disease Exacerbation  Chronic obstructive pulmonary disease (COPD) is a long-term (chronic) lung problem. When you have COPD, it can feel harder to breathe in or out. COPD exacerbation is a flare-up of symptoms when breathing gets worse and more treatment may be needed. Without treatment, flare-ups can be life-threatening. If they happen often, your lungs can become more damaged. What are the causes? Not taking your usual COPD medicines as told by your health care provider. A cold or the flu, which can cause infection in your lungs. Being exposed to things that make your breathing worse, such as: Smoke. Air pollution. Fumes. Dust. Allergies. Weather changes. What are the signs or symptoms? Symptoms do not get better or get worse even if you take your medicines as told by your provider. Symptoms may include: More shortness of breath. You may only be able to speak one or two words at a time. More coughing or mucus from your lungs. More wheezing or chest tightness. Being more tired and having less energy. Confusion. How is this diagnosed? This condition is diagnosed based on: Symptoms that get worse. Your medical history. A physical exam. You may also have tests, including: A chest X-ray. Blood or mucus tests. How is this treated? You may be able to stay home or you may need to go to the hospital. Treatment may include: Taking medicines. These may include: Inhalers. These have medicines in them that you breathe in. These may be more of what you already take or they may be new. Steroids. These reduce inflammation in the airways. These may be inhaled, taken by mouth, or given in an IV. Antibiotics. These treat infection. Using oxygen. Using a device to help you clear mucus. Follow these instructions at home: Medicines Take your medicines only as told by  your provider. If you were given antibiotics or steroids, take them as told by your provider. Do not stop taking them even if you start to feel better. Lifestyle Several times a day, wash your hands with soap and water for at least 20 seconds. If you cannot use soap and water, use hand sanitizer. This may help keep you from getting an infection. Avoid being around crowds or people who are sick. Do not smoke or use any products that contain nicotine or tobacco. If you need help quitting, ask your provider. Return to your normal activities when your provider says that it's safe. Use breathing methods to control your stress and catch your breath. How is this prevented? Follow your COPD action plan. The action plan tells you what to do if you're feeling good and what to do when you start feeling worse. Discuss the plan often with your provider. Make sure you get all the shots, also called vaccines, that your provider recommends. Ask your provider about a flu shot and a pneumonia shot. Use oxygen therapy if told by your provider. If you need home oxygen therapy, ask your provider how often to check your oxygen level with a device called an oximeter. Keep all follow-up visits to review your COPD action plan. Your provider will want to check on your condition often to keep you healthy and out of the hospital. Contact a health care provider if: Your COPD symptoms get worse. You have a fever or chills. You have trouble doing daily activities. You have trouble breathing even when you are resting. Get help right away if:  You are short of breath and cannot: Talk in full sentences. Do normal activities. You have chest pain. You feel confused. These symptoms may be an emergency. Call 911 right away. Do not wait to see if the symptoms will go away. Do not drive yourself to the hospital. This information is not intended to replace advice given to you by your health care provider. Make sure you discuss  any questions you have with your health care provider. Document Revised: 04/24/2023 Document Reviewed: 10/07/2022 Elsevier Patient Education  2024 [employer].

## 2024-04-13 NOTE — Progress Notes (Signed)
 Acute Office Visit  Subjective:     Patient ID: Sophia Smith, female    DOB: 09/06/1960, 63 y.o.   MRN: [id number]  No chief complaint on file.   HPI Patient is in today for sinus congestion and cough. She has a pmh consistent with COPD managed with albuterol . Sinus congestion started 3 days ago on Saturday and produced clear nasal drainage. She has associated left ear pain. The cough started when symptoms moved into her chest 2 days ago on Sunday. She states the cough has been producing a small amount of yellow-green colored sputum. She did get some relief from taking albuterol  about every 4 hours, and she took Pseudoephedrine for nasal congestion. She is positive for wheezing, headache, an dyspnea on exertion. She denies fever, sinus pressure, palpitations, and CP.   ROS See HPI     Objective:    BP (!) 140/90 (BP Location: Left Arm, Patient Position: Sitting, Cuff Size: Normal)   Pulse 99   Temp 98.1 F (36.7 C) (Oral)   Resp 18   Wt 156 lb (70.8 kg)   LMP 12/11/2012   SpO2 98%   BMI 25.96 kg/m  BP Readings from Last 3 Encounters:  04/13/24 (!) 140/90  02/18/24 (!) 155/101  01/28/24 (!) 149/94   Wt Readings from Last 3 Encounters:  04/13/24 156 lb (70.8 kg)  02/18/24 173 lb (78.5 kg)  10/17/23 173 lb (78.5 kg)      Physical Exam Constitutional:      Appearance: Normal appearance.  HENT:     Head: Normocephalic.     Right Ear: Tympanic membrane, ear canal and external ear normal. There is no impacted cerumen.     Left Ear: Tympanic membrane, ear canal and external ear normal. There is no impacted cerumen.     Nose: Congestion present. No rhinorrhea.     Mouth/Throat:     Mouth: Mucous membranes are moist.     Pharynx: Posterior oropharyngeal erythema present. No oropharyngeal exudate.  Eyes:     Conjunctiva/sclera: Conjunctivae normal.  Cardiovascular:     Rate and Rhythm: Normal rate and regular rhythm.  Pulmonary:     Effort: Pulmonary effort is  normal.     Breath sounds: Wheezing and rhonchi present.  Musculoskeletal:     Cervical back: No tenderness.  Lymphadenopathy:     Cervical: No cervical adenopathy.  Neurological:     General: No focal deficit present.     Mental Status: She is alert and oriented to person, place, and time.  Psychiatric:        Mood and Affect: Mood normal.     Results for orders placed or performed in visit on 04/13/24  POC SOFIA 2 FLU + SARS ANTIGEN FIA  Result Value Ref Range   Influenza A, POC Negative Negative   Influenza B, POC Negative Negative   SARS Coronavirus 2 Ag Negative Negative        Assessment & Plan:  SABRASABRADiagnoses and all orders for this visit:  COPD exacerbation (HCC) -     methylPREDNISolone  (MEDROL  DOSEPAK) 4 MG TBPK tablet; Take as directed by package insert. -     promethazine -dextromethorphan (PROMETHAZINE -DM) 6.25-15 MG/5ML syrup; Take 5 mLs by mouth 4 (four) times daily as needed. -     doxycycline  (VIBRA -TABS) 100 MG tablet; Take 1 tablet (100 mg total) by mouth 2 (two) times daily.  Wheezing -     POC SOFIA 2 FLU + SARS ANTIGEN FIA  Acute cough -  POC SOFIA 2 FLU + SARS ANTIGEN FIA  Malaise -     POC SOFIA 2 FLU + SARS ANTIGEN FIA  Other orders -     Discontinue: ipratropium-albuterol  (DUONEB) 0.5-2.5 (3) MG/3ML SOLN; Take 3 mLs by nebulization every 2 (two) hours as needed (wheeze, SOB).   Negative for covid and flu Treated for copd exacerbation with doxycycline  and medrol  dose pack  Cough syrup sent to pharmacy as needed Duoneb to start using at least twice a day but as needed every 4-6 hours Follow up if symptoms persist or worsen.    Brennon Otterness, PA-C

## 2024-04-13 NOTE — Telephone Encounter (Signed)
 Copied from CRM [id number]. Topic: Clinical - Prescription Issue >> Apr 13, 2024  9:59 AM Laurier BROCKS wrote: Reason for CRM: Johnae from [facility] called to advise they will need a new script for the following medication: ipratropium-albuterol  (DUONEB) 0.5-2.5 (3) MG/3ML SOLN as the frequency is incorrect.

## 2024-05-10 ENCOUNTER — Other Ambulatory Visit: Payer: Self-pay | Admitting: Physician Assistant

## 2024-05-10 DIAGNOSIS — S299XXA Unspecified injury of thorax, initial encounter: Secondary | ICD-10-CM

## 2024-05-10 DIAGNOSIS — F17200 Nicotine dependence, unspecified, uncomplicated: Secondary | ICD-10-CM

## 2024-05-10 DIAGNOSIS — S2232XA Fracture of one rib, left side, initial encounter for closed fracture: Secondary | ICD-10-CM

## 2024-05-10 DIAGNOSIS — Z13820 Encounter for screening for osteoporosis: Secondary | ICD-10-CM

## 2024-05-13 NOTE — Progress Notes (Signed)
 Sophia Smith                                          MRN: [id number]   05/13/2024   The [facility] Quality Team Specialist reviewed this patient medical record for the purposes of chart review for care gap closure. The following were reviewed: chart review for care gap closure-controlling blood pressure.    [facility] Quality Team

## 2024-06-02 ENCOUNTER — Other Ambulatory Visit: Payer: Self-pay | Admitting: Physician Assistant

## 2024-06-02 DIAGNOSIS — I10 Essential (primary) hypertension: Secondary | ICD-10-CM

## 2024-06-12 DIAGNOSIS — F1120 Opioid dependence, uncomplicated: Secondary | ICD-10-CM | POA: Diagnosis not present

## 2024-06-12 DIAGNOSIS — Z79899 Other long term (current) drug therapy: Secondary | ICD-10-CM | POA: Diagnosis not present

## 2024-06-12 DIAGNOSIS — M5416 Radiculopathy, lumbar region: Secondary | ICD-10-CM | POA: Diagnosis not present

## 2024-06-12 DIAGNOSIS — M51369 Other intervertebral disc degeneration, lumbar region without mention of lumbar back pain or lower extremity pain: Secondary | ICD-10-CM | POA: Diagnosis not present

## 2024-07-09 DIAGNOSIS — G894 Chronic pain syndrome: Secondary | ICD-10-CM | POA: Diagnosis not present

## 2024-07-09 DIAGNOSIS — M47814 Spondylosis without myelopathy or radiculopathy, thoracic region: Secondary | ICD-10-CM | POA: Diagnosis not present

## 2024-07-09 DIAGNOSIS — M5459 Other low back pain: Secondary | ICD-10-CM | POA: Diagnosis not present

## 2024-07-09 DIAGNOSIS — M47816 Spondylosis without myelopathy or radiculopathy, lumbar region: Secondary | ICD-10-CM | POA: Diagnosis not present

## 2024-07-10 DIAGNOSIS — F17210 Nicotine dependence, cigarettes, uncomplicated: Secondary | ICD-10-CM | POA: Diagnosis not present

## 2024-07-10 DIAGNOSIS — Z79891 Long term (current) use of opiate analgesic: Secondary | ICD-10-CM | POA: Diagnosis not present

## 2024-07-10 DIAGNOSIS — M1711 Unilateral primary osteoarthritis, right knee: Secondary | ICD-10-CM | POA: Diagnosis not present

## 2024-07-10 DIAGNOSIS — Z79899 Other long term (current) drug therapy: Secondary | ICD-10-CM | POA: Diagnosis not present

## 2024-07-10 DIAGNOSIS — M5416 Radiculopathy, lumbar region: Secondary | ICD-10-CM | POA: Diagnosis not present

## 2024-07-10 DIAGNOSIS — R825 Elevated urine levels of drugs, medicaments and biological substances: Secondary | ICD-10-CM | POA: Diagnosis not present

## 2024-07-10 DIAGNOSIS — F1120 Opioid dependence, uncomplicated: Secondary | ICD-10-CM | POA: Diagnosis not present

## 2024-08-12 ENCOUNTER — Telehealth: Payer: Self-pay | Admitting: Physician Assistant

## 2024-08-12 NOTE — Telephone Encounter (Signed)
 Copied from CRM [id number]. Topic: General - Other >> Aug 12, 2024 10:59 AM Selinda RAMAN wrote: Reason for CRM: Dagoberto the surgical coordinator with Dr Yvone called in stating she has faxed over surgical clearance paperwork several times to have the primary care provider fill out so surgery can be scheduled. She is going to re fax it back over for the 3rd time and would appreciate it filled out as soon as possible and sent back. Please assist patient further and if there are any questions for Dagoberto please call [phone]

## 2024-08-20 NOTE — Telephone Encounter (Signed)
 Copied from CRM [id number]. Topic: Medical Record Request - Provider/Facility Request >> Aug 20, 2024  1:32 PM Ivette P wrote: Reason for CRM: clear for total knee replacement with dr. Yvone. Please see if fax came through.   not sure if went through. manually fax another request over.   Waiting for Vermell Bologna to sign and send back so pt can be scheduled for surgery.

## 2024-08-27 ENCOUNTER — Ambulatory Visit: Admitting: Physician Assistant

## 2024-08-27 DIAGNOSIS — Z01818 Encounter for other preprocedural examination: Secondary | ICD-10-CM

## 2024-08-30 ENCOUNTER — Ambulatory Visit: Admitting: Physician Assistant

## 2024-09-08 ENCOUNTER — Ambulatory Visit: Admitting: Physician Assistant

## 2024-09-08 DIAGNOSIS — Z01818 Encounter for other preprocedural examination: Secondary | ICD-10-CM

## 2024-09-14 ENCOUNTER — Ambulatory Visit: Admitting: Physician Assistant
